# Patient Record
Sex: Male | Born: 1955
Health system: Southern US, Community
[De-identification: ages and names within clinical notes are randomized; demographics above are authoritative.]

## PROBLEM LIST (undated history)

## (undated) DIAGNOSIS — M1990 Unspecified osteoarthritis, unspecified site: Secondary | ICD-10-CM

## (undated) DIAGNOSIS — F329 Major depressive disorder, single episode, unspecified: Secondary | ICD-10-CM

## (undated) DIAGNOSIS — F32A Depression, unspecified: Secondary | ICD-10-CM

## (undated) DIAGNOSIS — G629 Polyneuropathy, unspecified: Secondary | ICD-10-CM

## (undated) HISTORY — PX: WISDOM TOOTH EXTRACTION: SHX21

## (undated) HISTORY — PX: OTHER SURGICAL HISTORY: SHX169

## (undated) HISTORY — DX: Polyneuropathy, unspecified: G62.9

## (undated) HISTORY — PX: TONSILLECTOMY AND ADENOIDECTOMY: SHX28

---
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
## 2003-01-03 ENCOUNTER — Ambulatory Visit (HOSPITAL_COMMUNITY): Admission: RE | Admit: 2003-01-03 | Discharge: 2003-01-03 | Payer: Self-pay | Admitting: Family Medicine

## 2003-01-03 ENCOUNTER — Encounter: Payer: Self-pay | Admitting: Family Medicine

## 2005-03-18 ENCOUNTER — Ambulatory Visit (HOSPITAL_COMMUNITY): Admission: RE | Admit: 2005-03-18 | Discharge: 2005-03-18 | Payer: Self-pay | Admitting: Family Medicine

## 2005-03-20 ENCOUNTER — Ambulatory Visit: Payer: Self-pay | Admitting: *Deleted

## 2005-03-21 ENCOUNTER — Ambulatory Visit: Payer: Self-pay

## 2005-03-21 ENCOUNTER — Encounter: Payer: Self-pay | Admitting: Cardiology

## 2005-03-27 ENCOUNTER — Ambulatory Visit: Payer: Self-pay | Admitting: Internal Medicine

## 2005-04-05 ENCOUNTER — Ambulatory Visit: Payer: Self-pay | Admitting: Internal Medicine

## 2005-04-09 ENCOUNTER — Ambulatory Visit: Payer: Self-pay | Admitting: *Deleted

## 2005-04-10 ENCOUNTER — Ambulatory Visit: Payer: Self-pay | Admitting: Cardiology

## 2005-04-10 ENCOUNTER — Inpatient Hospital Stay (HOSPITAL_BASED_OUTPATIENT_CLINIC_OR_DEPARTMENT_OTHER): Admission: RE | Admit: 2005-04-10 | Discharge: 2005-04-10 | Payer: Self-pay | Admitting: Cardiology

## 2005-04-17 ENCOUNTER — Ambulatory Visit (HOSPITAL_COMMUNITY): Admission: RE | Admit: 2005-04-17 | Discharge: 2005-04-17 | Payer: Self-pay | Admitting: Cardiovascular Disease

## 2005-04-17 ENCOUNTER — Ambulatory Visit: Payer: Self-pay | Admitting: Cardiovascular Disease

## 2005-05-02 ENCOUNTER — Ambulatory Visit: Payer: Self-pay | Admitting: *Deleted

## 2005-05-29 ENCOUNTER — Ambulatory Visit: Payer: Self-pay | Admitting: Internal Medicine

## 2007-09-02 ENCOUNTER — Ambulatory Visit (HOSPITAL_COMMUNITY): Admission: RE | Admit: 2007-09-02 | Discharge: 2007-09-02 | Payer: Self-pay | Admitting: Family Medicine

## 2009-03-27 ENCOUNTER — Ambulatory Visit: Payer: Self-pay | Admitting: Orthopedic Surgery

## 2009-03-27 DIAGNOSIS — M79609 Pain in unspecified limb: Secondary | ICD-10-CM

## 2009-03-27 DIAGNOSIS — G5790 Unspecified mononeuropathy of unspecified lower limb: Secondary | ICD-10-CM | POA: Insufficient documentation

## 2009-03-28 ENCOUNTER — Telehealth: Payer: Self-pay | Admitting: Orthopedic Surgery

## 2009-04-03 ENCOUNTER — Encounter (HOSPITAL_COMMUNITY): Admission: RE | Admit: 2009-04-03 | Discharge: 2009-05-03 | Payer: Self-pay | Admitting: Orthopedic Surgery

## 2009-04-11 ENCOUNTER — Encounter: Payer: Self-pay | Admitting: Orthopedic Surgery

## 2009-04-18 ENCOUNTER — Encounter: Payer: Self-pay | Admitting: Orthopedic Surgery

## 2009-04-19 ENCOUNTER — Encounter: Payer: Self-pay | Admitting: Orthopedic Surgery

## 2009-04-24 ENCOUNTER — Ambulatory Visit: Payer: Self-pay | Admitting: Orthopedic Surgery

## 2009-04-24 DIAGNOSIS — M84376A Stress fracture, unspecified foot, initial encounter for fracture: Secondary | ICD-10-CM | POA: Insufficient documentation

## 2009-05-03 ENCOUNTER — Ambulatory Visit (HOSPITAL_COMMUNITY): Admission: RE | Admit: 2009-05-03 | Discharge: 2009-05-03 | Payer: Self-pay | Admitting: Podiatry

## 2009-06-19 ENCOUNTER — Ambulatory Visit: Payer: Self-pay | Admitting: Orthopedic Surgery

## 2010-06-05 NOTE — Assessment & Plan Note (Signed)
Summary: 8 WK RE-CK LT FOOT/BCBS/CAF   Visit Type:  Follow-up  CC:  left foot pain.  History of Present Illness: I saw Clifford Harris in the office today for a followup visit.  He is a 55 years old man with the complaint of:  DX: stress fracture left foot.  Treatment: orthotics and rest.  MEDS:  Tylenol with Codeine.  PIROXICAM 20 Q DAY   Complaints: 100 percent better.  Today, scheduled for: 8 week recheck left foot.  Starts running tomorrow.  DR. Nolen Mu put him on Piroxicam.  Allergies: No Known Drug Allergies  Physical Exam  Additional Exam:  the foot looks good at this time. He does have a little contracture and the IP joint of the great toe. A little cyst over the talonavicular joint seen on MRI, but ankle motion is normal. His foot is nontender.     Impression & Recommendations:  Problem # 1:  STRESS FRACTURE, FOOT (ICD-733.94) Assessment Improved  Orders: Est. Patient Level II ([address]109)  Patient Instructions: 1)  CONTINUE THE PIROXICAM  2)  Please schedule a follow-up appointment as needed.

## 2010-09-21 NOTE — Cardiovascular Report (Signed)
Clifford Harris, Clifford Harris NO.:  [id number]   MEDICAL RECORD NO.:  [id number]          PATIENT TYPE:  OIB   LOCATION:  1961                         FACILITY:  [facility]   PHYSICIAN:  Rollene Rotunda, M.D.   DATE OF BIRTH:  05-04-1956   DATE OF PROCEDURE:  04/10/2005  DATE OF DISCHARGE:                              CARDIAC CATHETERIZATION   PROCEDURE:  Left heart catheterization/coronary angiography.   INDICATIONS FOR PROCEDURE:  Evaluate patient with syncope and an abnormal  Cardiolite suggesting apical ischemia.  The patient also has an abnormal  EKG.   PROCEDURE NOTE:  Left heart catheterization was attempted via the right  femoral artery.  I was able to cannulate the artery easily, but the guide-  wire would not pass as it kept deviating into a small branch vessel.  I then  switched to the left side.  Both vessels were cannulated using anterior wall  puncture.  #4 French arterial sheaths were inserted.  I was easily able to  perform the procedure from the left side.  Preformed Judkins pigtail  catheters were utilized.   RESULTS:  Hemodynamics:  LV 123/15, AO 123/98.   Coronaries:  The left main was normal.  The LAD was large wrapping the apex  and normal.  There was a large first diagonal which was normal.  The  circumflex and the AV groove was normal.  Essentially, the circumflex  consisted of a large mid obtuse marginal which was normal.  The right  coronary artery was dominant and normal throughout its course.  The PDA was  large and normal.   Left ventriculogram:  The left ventriculogram was obtained in the RAO  projection.  The EF was approximately 60%.  There was a spade shaped apex  suggestive of possible non-compaction.   CONCLUSION:  Normal coronaries.  Questionable non-compaction of the left  ventricular  apex.   PLAN:  This was discussed with Dr. Eden Emms who will schedule an MRI for this  patient.           ______________________________  Rollene Rotunda, M.D.     JH/MEDQ  D:  04/10/2005  T:  04/10/2005  Job:  [id number]   cc:   Kirk Ruths, M.D.  Fax: [phone]   Doylene Canning. Ladona Ridgel, M.D.  [address]  [address]  [place]  [place] [address]

## 2010-09-26 ENCOUNTER — Encounter: Payer: Self-pay | Admitting: Family Medicine

## 2010-09-26 ENCOUNTER — Ambulatory Visit (INDEPENDENT_AMBULATORY_CARE_PROVIDER_SITE_OTHER): Payer: BC Managed Care – PPO | Admitting: Family Medicine

## 2010-09-26 DIAGNOSIS — M19072 Primary osteoarthritis, left ankle and foot: Secondary | ICD-10-CM

## 2010-09-26 DIAGNOSIS — R269 Unspecified abnormalities of gait and mobility: Secondary | ICD-10-CM

## 2010-09-26 DIAGNOSIS — M19079 Primary osteoarthritis, unspecified ankle and foot: Secondary | ICD-10-CM

## 2010-09-26 NOTE — Progress Notes (Signed)
Subjective:    Patient ID: Clifford Harris, male    DOB: March 31, 1956, 54 y.o.   MRN: [id number]  HPI 55 year old male avid runner here for evaluation and treatment of left foot pain. He has had left midfoot pain for least 3 years. He has also run a marathon in [place] 2 years ago. He essentially is constant aching pain in the left medial midfoot region. He has seen several doctors in [place] and [place] for this. These include Dr. Lajoyce Corners at [facility], Dr. Prescilla Sours, Dr. Pricilla Holm, and Dr. Romeo Apple. He's had a bone scan in the past that shows possible stress fracture. He's also had an MRI. He was told he may have a Charcot type joint.  He was initially sent to by a tech for some orthotics recently, but found out there made of soft work and instead was referred here by a friend for custom orthotic evaluation.  His pain is mostly in the left midfoot and sometimes shoots down his toes. It is an achy pain, sometimes has a burning quality as well as occasional numbness and tingling. It is constant, but worse with running. He is currently only doing 20 miles per week. Occasionally he'll have pain at bedtime if the sheets rubbed his foot wrong. He has tried piroxicam and states that it does help, but does not want to take an anti-inflammatory long-term. He has also tried several different inserts and pads. He has no pain at rest.  PMH: Denies chronic medical problems.  Exertional syncope within last couple of years with normal coronary cath. PSH: Denies Meds: Pyroxicam prn Allergies: denies Family Hx: HTN Social Hx: Denies smoking or illicits, Occasional EtOH use.  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  Review of Systems Denies headaches, vision changes, swallowing problems, chest pain, shortness of breath, abdominal pain, bowel changes, bladder problems, fever, sweats, chills, weight loss.    Objective:   Physical Exam Gen. appearance: Well-appearing male in no distress Head: Normocephalic  atraumatic Psych: Normal affect Neuro: Alert and oriented x3, cranial nerves II through XII intact ENT moist mucous membranes Eyes: Extraocular muscles intact Neck some: Supple Chest: no labored breathing Abdomen: soft Hips: Full range of motion without pain. Bilaterally he has weak hip abduction, left greater than right. Knees: Full range of motion, no effusion. Fairly good VMO tone. Feet: Right foot shows moderate pedis cavus foot. Still is fairly good longitudinal and transverse arch. He does have some hammering of his toes. Left foot shows mild bunion of the first MTP with tenderness to palpation of the first MTP joint with some mild spurring. Medial midfoot shows some obvious hypertrophic bony changes with spurring. He also has some chronic mild erythema and little brawny appearance of the skin here. He has tenderness in this region over the navicular and medial cuneiform bones. Ankles: He has extremely tight Achilles tendon bilaterally, left greater than right. He has normal posterior tibialis tendon function. Gait: Walking shows mild out toeing bilaterally. On the right he has significant collapse of his midfoot. Running form shows him to be a midfoot Striker with again obvious collapse of the midfoot. Leg lengths: Equal  X-rays: Outside 2 views of the feet show midfoot arthritis on the left with some spurring in the region of the navicular bone and medial cuneiform. He also some arthritic changes about the first MTP joint on the left side. MRI: Images of the disc were reviewed. These show some mild arthritis in the midfoot region. He also has some edema  present in the navicular.        Assessment & Plan:  #1 left midfoot pain and arthritis #2 Gait abnormality with out toeing gait and midfoot collapse #3 possible nerve entrapment versus Morton's neuroma  Discussed options with the patient, at this point we'll proceed with custom orthotics. See procedure below. He tried these  afterward they felt very good and helped correct his midfoot collapse. He will followup with me in one month, and if he would like different ones for his dress shoes and we can make them at that time. Also gave him hip strengthening exercises to work on his hip weakness. He declined any new medicines today, and just wants to try Tylenol Extra Strength. Followup in one month.  Patient was fitted for a : standard, cushioned, semi-rigid orthotic. The orthotic was heated and afterward the patient stood on the orthotic blank positioned on the orthotic stand. The patient was positioned in subtalar neutral position and 10 degrees of ankle dorsiflexion in a weight bearing stance. After completion of molding, a stable base was applied to the orthotic blank. The blank was ground to a stable position for weight bearing. Size: 11 blue swirl Base: blue EVA Posting: small blue foam to medial arch on Lt foot (I accidentally tapered a little too much on the medial aspect of the orthotic so I added back the blue foam which was very comfortable) Additional orthotic padding: none  At followup visit, can consider adding neuroma pad to see if this helps out with the numbness and tingling in his toes. At this point I don't think that is his major concern  I spent more than 45 minutes with this patient, greater than 50% spent on making his orthotics as well as counseling in face-to-face time regarding diagnosis, prognosis, and management. In addition we also reviewed his x-rays and MRI in detail with him.

## 2010-10-31 ENCOUNTER — Ambulatory Visit (INDEPENDENT_AMBULATORY_CARE_PROVIDER_SITE_OTHER): Payer: BC Managed Care – PPO | Admitting: Family Medicine

## 2010-10-31 ENCOUNTER — Encounter: Payer: Self-pay | Admitting: Family Medicine

## 2010-10-31 DIAGNOSIS — R269 Unspecified abnormalities of gait and mobility: Secondary | ICD-10-CM

## 2010-10-31 DIAGNOSIS — M19072 Primary osteoarthritis, left ankle and foot: Secondary | ICD-10-CM

## 2010-10-31 DIAGNOSIS — M19079 Primary osteoarthritis, unspecified ankle and foot: Secondary | ICD-10-CM

## 2010-10-31 NOTE — Progress Notes (Signed)
  Subjective:    Patient ID: Clifford Harris, male    DOB: 11-Jan-1956, 55 y.o.   MRN: [id number]  HPI F/u Lt foot pain. 20-30% better.  He has h/o of midfoot arthritis at navicular and medial cuneiform bones as well as 1st MTP.  MRI previously showed stress injury to navicular.  We made orthotics 1 month ago, he does like these, however on Lt orthotic where I put blue posting along medial edge, this often does not stay in place. He would like new pair of orthotics today. Running 12 MPW, riding bike 2 days per week.  Can run 1/2 of his run then starts getting pain, usually better after run.  He is training for 5 mile [place] at [facility] next month.  Review of Systems Denies F,C,S    Objective:   Physical Exam Gen: NAD Rt foot: moderate pes cavus Lt foot: midfoot collapse with hypertrophy at midfoot joint and some arthritic changes. Gait: still shows some pronation and collapse of the Lt midfoot       Assessment & Plan:  Lt foot pain with midfoot arthritis and mild navicular stress injury - made new orthotics today, see note.  I did not charge him for these since I accidentally shaved off too much of the EVA base on his Lt side on the first pair - in addition, for his original Lt orthotic, I glued down a brick tapered posting on medial arch region which he liked a lot, so I actually did the same thing for his new orthotic as well - f/u 2-3 months or prn  Patient was fitted for a : standard, cushioned, semi-rigid orthotic. The orthotic was heated and afterward the patient stood on the orthotic blank positioned on the orthotic stand. The patient was positioned in subtalar neutral position and 10 degrees of ankle dorsiflexion in a weight bearing stance. After completion of molding, a stable base was applied to the orthotic blank. The blank was ground to a stable position for weight bearing. Size: 11 blue swirl Base: EVA Posting: brick taper on Lt arch Additional orthotic padding:  none  Tried these prior to leaving and they significantly improved his gait and pain.

## 2011-03-15 ENCOUNTER — Ambulatory Visit (INDEPENDENT_AMBULATORY_CARE_PROVIDER_SITE_OTHER): Payer: BC Managed Care – PPO | Admitting: Sports Medicine

## 2011-03-15 VITALS — BP 138/80 | Ht 70.0 in | Wt 155.0 lb

## 2011-03-15 DIAGNOSIS — R269 Unspecified abnormalities of gait and mobility: Secondary | ICD-10-CM

## 2011-03-15 DIAGNOSIS — M19072 Primary osteoarthritis, left ankle and foot: Secondary | ICD-10-CM

## 2011-03-15 DIAGNOSIS — M19079 Primary osteoarthritis, unspecified ankle and foot: Secondary | ICD-10-CM

## 2011-03-15 MED ORDER — GABAPENTIN 300 MG PO CAPS: 300.0000 mg | ORAL_CAPSULE | Freq: Every day | ORAL | Status: DC

## 2011-03-15 MED ORDER — KETOPROFEN POWD: Status: DC

## 2011-03-15 NOTE — Assessment & Plan Note (Signed)
This patient has fairly advanced midfoot arthritis and loss of the arch on that side. He is also getting some first MTP arthritis of that foot  I think this may have been triggered by bilateral tarsal tunnel syndrome with a history of a very high arch  This probably accounts for the significant hammertoes  We will give him a trial of topical ketoprofen and when necessary Aleve  Also try gabapentin 300 at night for one month then recheck

## 2011-03-15 NOTE — Patient Instructions (Signed)
Use Aleve 1-2 tabs for ankle pain  Use orthotics for any running  Rub ketoprofen gel into ankle 3-4 times per day  Please follow up in 4-6 weeks

## 2011-03-15 NOTE — Assessment & Plan Note (Signed)
For his gait he is unable to push off the left foot We put a first ray post of his orthotic and this did improve his push off He has external rotation of both feet but more so on the left with running

## 2011-03-15 NOTE — Progress Notes (Signed)
  Subjective:    Patient ID: Clifford Harris, male    DOB: [birth year], 55 y.o.   MRN: [id number]  HPI  Pt presents to clinic for evaluation of lt foot pain that he has had for several years.  Long hx of running- has done 34 marathons.  Now running 2-3 miles per day. Saw Dr. Romeo Apple orthopedist in [place], who sent him to podiatrist who was concerned about Charcot foot in 2010.  Was put in hard 3/4 length orthotic which was painful.  Saw Dr. Lajoyce Corners who sent him for cork orthotics- he did not have these made.  Takes tylenol extra strength or tylenol arthritis for pain.  Pt has numbness in the ball of lt foot that started even before the break down of lt midfoot.   He has seen Dr. [name] on 2 occasions and was making him custom orthotics with an extra midfoot post over the arch of the left foot These are helpful and give him more support than the other orthotics   Review of Systems     Objective:   Physical Exam  Normal motion L ankle, limited dorsiflexion 30 deg extension great toe, 20 deg flexion - lt  30 deg flexion great toe, 20 deg extension - rt Lt hip abduction and flexion strength excellent Hammer toes 2-4 on bilat, subluxation of 5th toe and bunionette on LT Lt midfoot collapse with navicular drop   X-rays reveal some midfoot arthritis on the left primarily involving the navicular MRI reveals that there is degeneration of the talonavicular joint and also some partial collapse of the navicular There is abnormal signal in the bone is well     Assessment & Plan:

## 2011-04-11 ENCOUNTER — Ambulatory Visit (INDEPENDENT_AMBULATORY_CARE_PROVIDER_SITE_OTHER): Payer: BC Managed Care – PPO | Admitting: Sports Medicine

## 2011-04-11 ENCOUNTER — Encounter: Payer: Self-pay | Admitting: Sports Medicine

## 2011-04-11 VITALS — BP 130/82 | HR 65

## 2011-04-11 DIAGNOSIS — R269 Unspecified abnormalities of gait and mobility: Secondary | ICD-10-CM

## 2011-04-11 DIAGNOSIS — M19079 Primary osteoarthritis, unspecified ankle and foot: Secondary | ICD-10-CM

## 2011-04-11 DIAGNOSIS — G5700 Lesion of sciatic nerve, unspecified lower limb: Secondary | ICD-10-CM

## 2011-04-11 DIAGNOSIS — M79609 Pain in unspecified limb: Secondary | ICD-10-CM

## 2011-04-11 DIAGNOSIS — M5430 Sciatica, unspecified side: Secondary | ICD-10-CM

## 2011-04-11 DIAGNOSIS — M19072 Primary osteoarthritis, left ankle and foot: Secondary | ICD-10-CM

## 2011-04-11 MED ORDER — GABAPENTIN 300 MG PO CAPS: 300.0000 mg | ORAL_CAPSULE | Freq: Every day | ORAL | Status: DC

## 2011-04-11 MED ORDER — KETOPROFEN POWD: Status: DC

## 2011-04-11 NOTE — Patient Instructions (Addendum)
Continue to take Gabapentin 300 mg at night.  Continue to take ketoprofen cream as needed on left foot.  C/w orthotics when walking and running.   F/U in 3 months.

## 2011-04-11 NOTE — Assessment & Plan Note (Signed)
Exam for back localization negative.  Pain likely referred from foot, tight gluteus/hamstring/piriformis.  Gabapentin working well at low dose.

## 2011-04-11 NOTE — Assessment & Plan Note (Signed)
Much improved and I will have him continue the ketoprofen gel  Recheck with me in 3 months

## 2011-04-11 NOTE — Progress Notes (Signed)
  Subjective:    Patient ID: Clifford Harris, male    DOB: Nov 03, 1955, 55 y.o.   MRN: [id number]  HPI  F/u visit for left foot pain and left neuropathic type pain. Long hx of running- has done 34 marathons.  Now running 2-3 miles per day. Charcot foot in 2010.   Pt has numbness in the ball of lt foot that started even before the break down of lt midfoot.  His foot pain and nerve pain has improved 80% subjectively. He has great relief from gabapentin 300 mg QHS and ketoprofen ointment to left foot. His orthotics with an extra midfoot post over the arch of the left foot are giving him relief when walking and running. He is planning on running a 5 mile race.  Review of Systems No fever, new arthralgias, weight loss. Drinks 4 or more beers on weekends.    Objective:   Physical Exam  Lt hip straight leg normal without back or sciatic radiation. Brazinski's normal left side. Palpation of hamstring on left side elicited mild nerve sensation. Hammer toes 2-4 on bilat, subluxation of 5th toe and bunionette on LT Lt midfoot collapse with navicular drop   X-rays reveal some midfoot arthritis on the left primarily involving the navicular MRI reveals that there is degeneration of the talonavicular joint and also some partial collapse of the navicular There is abnormal signal in the bone is well     Assessment & Plan:

## 2011-04-11 NOTE — Assessment & Plan Note (Signed)
Continue orthotics as this feels improved

## 2011-04-11 NOTE — Assessment & Plan Note (Signed)
80% improvement.  C/w gabapentin and ketoprofen topically.  Orthotics working well.   Able to run 3 miles daily. Planning on running 5 mile run.

## 2011-07-08 ENCOUNTER — Ambulatory Visit (INDEPENDENT_AMBULATORY_CARE_PROVIDER_SITE_OTHER): Payer: BC Managed Care – PPO | Admitting: Sports Medicine

## 2011-07-08 ENCOUNTER — Encounter: Payer: Self-pay | Admitting: Sports Medicine

## 2011-07-08 VITALS — BP 137/88 | HR 61

## 2011-07-08 DIAGNOSIS — M19072 Primary osteoarthritis, left ankle and foot: Secondary | ICD-10-CM

## 2011-07-08 DIAGNOSIS — M5430 Sciatica, unspecified side: Secondary | ICD-10-CM

## 2011-07-08 DIAGNOSIS — M19079 Primary osteoarthritis, unspecified ankle and foot: Secondary | ICD-10-CM

## 2011-07-08 MED ORDER — KETOPROFEN POWD: Status: DC

## 2011-07-08 NOTE — Assessment & Plan Note (Signed)
Since the ketoprofen gel is working for his pain we will continue with this.  He will increase the frequency to QID so that he can have a longer period of pain relief.  He will continue his activity as tolerated, including running.  We gave him extra forefoot padding in his orthotics at a previous visit to accommodate for his lack of midfoot mobility.

## 2011-07-08 NOTE — Progress Notes (Signed)
  Subjective:    Patient ID: JAVARRI SEGAL, male    DOB: [birth year], 56 y.o.   MRN: [id number]  HPI 56 y/o male is here to follow up for left foot pain and sciatica.  The sciatica is improved with gabapentin qhs.  He has no sciatic pain.  The foot pain is unchanged since last visit.  The ketoprofen gel helps his pain but he feels it wears off too soon.  He is taking it twice daily.  He has been able to continue running.  He has been wearing his orthotics.  He has new shoes which are more cushioned than his old shoes.  He is planning on running 7 mile uphill race.   Review of Systems     Objective:   Physical Exam  Left foot has some bony deformity of the midfoot In the vicinity of the navicular. There is tenderness to palpation over these bony prominences The swelling that was noted on his previous exam is resolved There is now no swelling of the foot, specifically over the dorsal or the lateral foot  Gait: When running he everts the left foot. He doesn't get plantar flexion motion with this foot so it is flopping down between strides with fairly high impact and not allowing for a normal push off.      Assessment & Plan:

## 2011-07-08 NOTE — Assessment & Plan Note (Signed)
This is well controlled on low dose of gabapentin  Continue this indefinitely  Can try weaning if this is resolved at his next visit in 4 months or so

## 2011-08-08 ENCOUNTER — Other Ambulatory Visit: Payer: Self-pay | Admitting: *Deleted

## 2011-08-08 MED ORDER — KETOPROFEN POWD: Status: DC

## 2011-10-09 ENCOUNTER — Ambulatory Visit (INDEPENDENT_AMBULATORY_CARE_PROVIDER_SITE_OTHER): Payer: BC Managed Care – PPO | Admitting: Sports Medicine

## 2011-10-09 VITALS — BP 124/70

## 2011-10-09 DIAGNOSIS — M19079 Primary osteoarthritis, unspecified ankle and foot: Secondary | ICD-10-CM

## 2011-10-09 DIAGNOSIS — M19072 Primary osteoarthritis, left ankle and foot: Secondary | ICD-10-CM

## 2011-10-09 MED ORDER — TRAMADOL HCL 50 MG PO TABS: 50.0000 mg | ORAL_TABLET | Freq: Four times a day (QID) | ORAL | Status: DC | PRN

## 2011-10-09 NOTE — Assessment & Plan Note (Addendum)
Unfortunately we need to hold therapy briefly while we treat his extensive fungal infection. He uses Aleve twice a day which is not successful. I would certainly recommend increasing to tramadol briefly until his fungus history, then he can return to the ketoprofen gel. Certainly at some point, we can offer an injection into the navicular/medial cuneiform joint, and his talonavicular ganglion cyst. We can see him back in one month.

## 2011-10-09 NOTE — Progress Notes (Signed)
Patient ID: Clifford Harris, male   DOB: 29-Oct-1955, 56 y.o.   MRN: [id number]  Subjective:   WU:[id number] foot, new rash.  WGN:FAOZH is a very pleasant 56 year old male who we've been treating for foot pain. He subsequently had an MRI, that confirmed a talonavicular ganglion cyst, as well as mid foot degenerative changes, most pronounced at the naviculo-medial cuneiform joint.  He is been managed successfully with topical ketoprofen gel. Unfortunately he recently developed fungus that was fairly widespread over his left foot, left hand, and face. He went to the dermatologist, who prescribed clobetasol, terbinafine oral, topical hydrocortisone, an oral prednisone. He has since stopped the ketoprofen. He is wondering if there another option to control his pain until the fungus is treated.   Past medical history, surgical history, family history, social history, allergies, and medications reviewed from the medical record and no changes needed.  Review of Systems: No fevers, chills, night sweats, weight loss, chest pain, or shortness of breath.    Objective:  General:  Well Developed, well nourished, and in no acute distress. Neuro:  Alert and oriented x3, extra-ocular muscles intact. Skin: Warm and dry. Respiratory:  Not using accessory muscles, speaking in full sentences. Musculoskeletal: still tender to palpation along the medial mid foot. He does have a significant scaly, cracked rash over his dorsum of his left foot, his entire left hand, and his face at the nasolabial folds.   Assessment & Plan:

## 2011-10-17 ENCOUNTER — Other Ambulatory Visit: Payer: Self-pay | Admitting: *Deleted

## 2011-10-17 MED ORDER — GABAPENTIN 300 MG PO CAPS: 300.0000 mg | ORAL_CAPSULE | Freq: Every day | ORAL | Status: DC

## 2012-03-03 ENCOUNTER — Other Ambulatory Visit: Payer: Self-pay | Admitting: *Deleted

## 2012-03-03 DIAGNOSIS — M19072 Primary osteoarthritis, left ankle and foot: Secondary | ICD-10-CM

## 2012-03-03 MED ORDER — TRAMADOL HCL 50 MG PO TABS: 50.0000 mg | ORAL_TABLET | Freq: Four times a day (QID) | ORAL | Status: DC | PRN

## 2012-04-16 ENCOUNTER — Other Ambulatory Visit: Payer: Self-pay | Admitting: *Deleted

## 2012-04-16 MED ORDER — GABAPENTIN 300 MG PO CAPS: 300.0000 mg | ORAL_CAPSULE | Freq: Every day | ORAL | Status: DC

## 2012-07-24 ENCOUNTER — Other Ambulatory Visit: Payer: Self-pay | Admitting: *Deleted

## 2012-07-24 DIAGNOSIS — M19072 Primary osteoarthritis, left ankle and foot: Secondary | ICD-10-CM

## 2012-07-24 MED ORDER — TRAMADOL HCL 50 MG PO TABS: 50.0000 mg | ORAL_TABLET | Freq: Four times a day (QID) | ORAL | Status: DC | PRN

## 2012-09-16 ENCOUNTER — Other Ambulatory Visit: Payer: Self-pay | Admitting: Sports Medicine

## 2012-09-22 ENCOUNTER — Other Ambulatory Visit: Payer: Self-pay | Admitting: *Deleted

## 2012-09-22 DIAGNOSIS — M19072 Primary osteoarthritis, left ankle and foot: Secondary | ICD-10-CM

## 2012-09-22 MED ORDER — TRAMADOL HCL 50 MG PO TABS: 50.0000 mg | ORAL_TABLET | Freq: Four times a day (QID) | ORAL | Status: AC | PRN

## 2012-09-22 NOTE — Progress Notes (Signed)
Scheduled pt for a f/u appt 10/21/12 with Dr. Darrick Penna.

## 2012-10-12 ENCOUNTER — Other Ambulatory Visit: Payer: Self-pay | Admitting: *Deleted

## 2012-10-12 MED ORDER — GABAPENTIN 300 MG PO CAPS: 300.0000 mg | ORAL_CAPSULE | Freq: Every day | ORAL | Status: DC

## 2012-10-21 ENCOUNTER — Ambulatory Visit (INDEPENDENT_AMBULATORY_CARE_PROVIDER_SITE_OTHER): Payer: BC Managed Care – PPO | Admitting: Sports Medicine

## 2012-10-21 ENCOUNTER — Encounter: Payer: Self-pay | Admitting: Sports Medicine

## 2012-10-21 VITALS — BP 134/84 | HR 51 | Ht 70.0 in | Wt 150.0 lb

## 2012-10-21 DIAGNOSIS — M19072 Primary osteoarthritis, left ankle and foot: Secondary | ICD-10-CM

## 2012-10-21 DIAGNOSIS — M19079 Primary osteoarthritis, unspecified ankle and foot: Secondary | ICD-10-CM

## 2012-10-21 MED ORDER — GABAPENTIN 300 MG PO CAPS: 300.0000 mg | ORAL_CAPSULE | Freq: Every day | ORAL | Status: DC

## 2012-10-21 MED ORDER — TRAMADOL HCL 50 MG PO TABS: 50.0000 mg | ORAL_TABLET | Freq: Three times a day (TID) | ORAL | Status: DC | PRN

## 2012-10-21 MED ORDER — DICLOFENAC SODIUM 1 % TD GEL: 2.0000 g | Freq: Four times a day (QID) | TRANSDERMAL | Status: DC

## 2012-10-21 NOTE — Assessment & Plan Note (Addendum)
Likely having some progression from last visit but still is allowing patient to run regularly.  Patient did have refill of nuerontin, increased tramadol for more pain control during the day.  Discussed proper shoe wear and make sure plenty of padding.  Change to voltaren gel from ketoprofen to try.  Come back again if worsening pain or annually at least

## 2012-10-21 NOTE — Progress Notes (Signed)
CC: F/u foot pain  HPI:  Patient is here for follow up of foot pain.  Patient did have a ganglion cyst of the talonavicular joint over 2 years ago, but it is really more the degenerative changes of the talonavicular joint that is causing the discomfort. Also severe naviculo-medial cuneiform joint.  He continues the neurontin at night, tramadol 1 time during the day and ketoprofen powder which is very expensive.  States it takes the edges off. No new symptoms.  Still running up to 5-6 miles a day. Patient is contemplating doing one more marathon. But is doing a 10K here in the next couple weeks.   Past medical history, social, surgical and family history all reviewed.   Physical exam Blood pressure 134/84, pulse 51, height 5\' 10"  (1.778 m), weight 150 lb (68.04 kg). General: No apparent distress alert and oriented x3 mood and affect normal Respiratory: Patient's speak in full sentences and does not appear short of breath Skin: Warm dry intact with no signs of infection or rash Neuro: Cranial nerves II through XII are intact, neurovascularly intact in all extremities with 2+ DTRs and 2+ pulses. Left foot exam:  Still has mild discomfort over talo-navicular joint but less effusion from last one. Decrease ROM of the joint as well. NVI distally.

## 2012-10-21 NOTE — Patient Instructions (Addendum)
Good to see you.  We have refilled your neurontin and your increase your tramadol to 100mg  daily.  We have printed you a prescription of Voltaren gel to try instead of the ketoprofen.  Continue to adjust your hobbies to allow you to run.  If you ever have worsening pain or new pain please come back and we will adjust accordingly.

## 2012-10-29 ENCOUNTER — Other Ambulatory Visit: Payer: Self-pay | Admitting: *Deleted

## 2012-10-29 ENCOUNTER — Other Ambulatory Visit: Payer: Self-pay | Admitting: Sports Medicine

## 2012-10-29 MED ORDER — TRAMADOL HCL 50 MG PO TABS: 50.0000 mg | ORAL_TABLET | Freq: Three times a day (TID) | ORAL | Status: DC | PRN

## 2013-01-12 ENCOUNTER — Other Ambulatory Visit: Payer: Self-pay | Admitting: *Deleted

## 2013-01-12 ENCOUNTER — Other Ambulatory Visit: Payer: Self-pay | Admitting: Sports Medicine

## 2013-01-12 MED ORDER — TRAMADOL HCL 50 MG PO TABS: 100.0000 mg | ORAL_TABLET | Freq: Three times a day (TID) | ORAL | Status: DC | PRN

## 2013-01-12 NOTE — Progress Notes (Signed)
Refilled with directions to take 100 mg tid prn per Dr. Darrick Penna.  Pt notified. States he usually takes a total of 4 tabs per day, but sometimes takes 6 tabs.

## 2013-06-05 ENCOUNTER — Other Ambulatory Visit: Payer: Self-pay | Admitting: Sports Medicine

## 2013-06-16 ENCOUNTER — Encounter: Payer: Self-pay | Admitting: Family Medicine

## 2013-06-16 ENCOUNTER — Ambulatory Visit (INDEPENDENT_AMBULATORY_CARE_PROVIDER_SITE_OTHER): Payer: BC Managed Care – PPO | Admitting: Family Medicine

## 2013-06-16 VITALS — BP 161/88 | HR 58 | Ht 71.0 in | Wt 143.0 lb

## 2013-06-16 DIAGNOSIS — M25551 Pain in right hip: Secondary | ICD-10-CM

## 2013-06-16 DIAGNOSIS — M25559 Pain in unspecified hip: Secondary | ICD-10-CM

## 2013-06-16 MED ORDER — IBUPROFEN 800 MG PO TABS: 800.0000 mg | ORAL_TABLET | Freq: Three times a day (TID) | ORAL | Status: DC | PRN

## 2013-06-16 NOTE — Patient Instructions (Addendum)
You have strained/partially tore one of your hip external rotators. These are treated conservatively. Take ibuprofen 800mg  three times a day with food for pain and inflammation  Try to avoid painful activities (typically squats, lunges, hill running) otherwise it's activities as tolerated. Tennis ball to massage area when sitting if you can tolerate this and feels better with this. Start physical therapy to learn an extensive home exercise program (hip abduction, external rotation strengthening and stretching). Follow up in 5-6 weeks for reevaluation.

## 2013-06-22 ENCOUNTER — Encounter: Payer: Self-pay | Admitting: Family Medicine

## 2013-06-22 DIAGNOSIS — M25551 Pain in right hip: Secondary | ICD-10-CM | POA: Insufficient documentation

## 2013-06-22 NOTE — Progress Notes (Signed)
Patient ID: Clifford Harris, male   DOB: 08/08/1955, 58 y.o.   MRN: [id number]  PCP: Kirk RuthsMCGOUGH,WILLIAM M, MD  Subjective:   HPI: Patient is a 58 y.o. male here for right hip pain.  Patient reports pain started in December when he was hanging christmas orb lights. He was twisting when he felt a rip or tear sensation in right buttock. Pain comes on with hip extended. Radiates down to knee. No numbness/tingling. Takes neurontin, tramadol, voltaren gel with some benefit. Has continued to run through these injuries - pain worse with running. Using icy hot also. No back pain. No bowel/bladder dysfunction.  Past Medical History  Diagnosis Date  . Neuropathy     Current Outpatient Prescriptions on File Prior to Visit  Medication Sig Dispense Refill  . diclofenac sodium (VOLTAREN) 1 % GEL Apply 2 g topically 4 (four) times daily. To affected joint.  100 g  11  . gabapentin (NEURONTIN) 300 MG capsule Take 1 capsule (300 mg total) by mouth at bedtime.  90 capsule  3  . traMADol (ULTRAM) 50 MG tablet TAKE TWO TABLETS THREE TIMES DAILY AS NEEDED FOR PAIN.  180 tablet  1   No current facility-administered medications on file prior to visit.    History reviewed. No pertinent past surgical history.  No Known Allergies  History   Social History  . Marital Status: Married    Spouse Name: N/A    Number of Children: N/A  . Years of Education: N/A   Occupational History  . Not on file.   Social History Main Topics  . Smoking status: Never Smoker   . Smokeless tobacco: Never Used  . Alcohol Use: Not on file  . Drug Use: Not on file  . Sexual Activity: Not on file   Other Topics Concern  . Not on file   Social History Narrative  . No narrative on file    Family History  Problem Relation Age of Onset  . Sudden death Neg Hx   . Hypertension Neg Hx   . Hyperlipidemia Neg Hx   . Diabetes Neg Hx   . Heart attack Neg Hx     BP 161/88  Pulse 58  Ht 5\' 11"  (1.803 m)  Wt 143 lb  (64.864 kg)  BMI 19.95 kg/m2  Review of Systems: See HPI above.    Objective:  Physical Exam:  Gen: NAD  Back/R hip: No gross deformity, scoliosis. TTP Right buttock over piriformis, hip rotators.  No midline or bony TTP. FROM without pain. Strength LEs 5/5 all muscle groups with pain on abduction.   2+ MSRs in patellar and achilles tendons, equal bilaterally. Negative SLRs. Sensation intact to light touch bilaterally. Negative logroll bilateral hips Negative fabers. Pain with right piriformis.    Assessment & Plan:  1. Right hip pain - consistent with strain/partial tear of external rotator of hip - possibly piriformis, gemelli.  Start with physical therapy, home exercise program.  Nsaids, tennis ball massage, avoid painful activities.  F/u in 5-6 weeks.

## 2013-06-22 NOTE — Assessment & Plan Note (Signed)
consistent with strain/partial tear of external rotator of hip - possibly piriformis, gemelli.  Start with physical therapy, home exercise program.  Nsaids, tennis ball massage, avoid painful activities.  F/u in 5-6 weeks.

## 2013-06-29 ENCOUNTER — Ambulatory Visit (HOSPITAL_COMMUNITY)
Admission: RE | Admit: 2013-06-29 | Discharge: 2013-06-29 | Disposition: A | Discharge status: Home / Self Care | Payer: BC Managed Care – PPO | Source: Ambulatory Visit | Attending: Family Medicine | Admitting: Family Medicine

## 2013-06-29 DIAGNOSIS — G589 Mononeuropathy, unspecified: Secondary | ICD-10-CM | POA: Insufficient documentation

## 2013-06-29 DIAGNOSIS — IMO0001 Reserved for inherently not codable concepts without codable children: Secondary | ICD-10-CM | POA: Insufficient documentation

## 2013-06-29 DIAGNOSIS — M25669 Stiffness of unspecified knee, not elsewhere classified: Secondary | ICD-10-CM | POA: Insufficient documentation

## 2013-06-29 DIAGNOSIS — S76319A Strain of muscle, fascia and tendon of the posterior muscle group at thigh level, unspecified thigh, initial encounter: Secondary | ICD-10-CM

## 2013-06-29 DIAGNOSIS — M5430 Sciatica, unspecified side: Secondary | ICD-10-CM | POA: Insufficient documentation

## 2013-06-29 DIAGNOSIS — R269 Unspecified abnormalities of gait and mobility: Secondary | ICD-10-CM | POA: Insufficient documentation

## 2013-06-29 NOTE — Evaluation (Signed)
Physical Therapy Evaluation  Patient Details  Name: Clifford Harris MRN: [id number] Date of Birth: Oct 31, 1955  Today's Date: 06/29/2013 Time: 1100-1155 PT Time Calculation (min): 55 min Charge:  Evaluation 1100-1145; [place] [id number]             Visit#: 1 of 4  Re-eval: 07/29/13 Assessment Diagnosis: Hamstring pull Next MD Visit: 07/13/2013 Prior Therapy: none  Authorization: BCBS    Past Medical History:  Past Medical History  Diagnosis Date  . Neuropathy    Past Surgical History: No past surgical history on file.  Subjective Symptoms/Limitations Symptoms: Patient reports that his pain started in December when he was hanging christmas orb lights.  He states that he was twisting when he felt a rip or tear sensation in right buttock.  He now has increased pain with offloading during jogging.      He is taking medication at night to help him sleep for some foot arthritis  which successful so he is not sure if the buttock pain bothers him at night.  .  The pt states that he sits on a three inch gel cushion or he has incresaed pain.  He is continuing to run 1/2 marathons and 5 K's  How long can you sit comfortably?: Sitting without gel pad he will have pain within five minutes. How long can you stand comfortably?: no difference. How long can you walk comfortably?: Unable to walk two miles  Repetition: Increases Symptoms Pain Assessment Currently in Pain?: No/denies Pain Score: 10-Worst pain ever Pain Location: Buttocks Pain Onset: More than a month ago Pain Frequency: Intermittent Pain Relieving Factors: changing position   Sensation/Coordination/Flexibility/Functional Tests Flexibility 90/90: Positive Functional Tests Functional Tests: foto 51  Assessment RLE Strength Right Hip Flexion: 5/5 Right Hip Extension: 3-/5 Right Hip ABduction: 5/5 Right Knee Flexion: 3+/5 Right Knee Extension: 5/5 Right Ankle Dorsiflexion: 4/5  Exercise/Treatments  Stretches Active Hamstring  Stretch: 3 reps;30 seconds Passive Hamstring Stretch: 1 rep;60 seconds   Prone  Hamstring Curl: 10 reps Hip Extension: 10 reps;Limitations Hip Extension Limitations: with knee bent to 90 degrees due to SLR causing pain.   Modalities Modalities: Ultrasound Ultrasound Ultrasound Location: Rt buttock Ultrasound Parameters: 1.5 w/cm2 x 8:00 Ultrasound Goals: Pain  Physical Therapy Assessment and Plan PT Assessment and Plan Clinical Impression Statement: Pt is a 58 yo male who strained his Rt gluteus maximus as well as his hamstring in December of 2014.  Mr. Gear has been referred to physical therapy to assist him in returning to his prior level of functioning.  Mr. Ganas is a runner running marathons, 1/2 marathons and 5 K's stating that whenever he llifts his foot off the pavement is when he has excruciating pain.  Mr. Holliman has not stopped running and  despite urging by the therapist is reluctant to do so.  Exam demonstrates very tight hamstrings, and significantly weakened hamstring and gluteal mm.  Pt was asked by the therapist to come in two or three times a week but pt requests only one time a week.   Pt will benefit from skilled therapeutic intervention in order to improve on the following deficits: Pain;Increased fascial restricitons;Decreased range of motion;Decreased strength Rehab Potential: Good PT Frequency: Min 1X/week PT Duration: 4 weeks PT Treatment/Interventions: Therapeutic activities;Therapeutic exercise;Manual techniques;Modalities PT Plan: Assess pt one time max rep for both Rt and Lt LE .  Pt should be at 65% prior to returning to running.  Pt to begin wb acitvities ie squats , lunges(pushing back slowly);  step ups .Marland [employer].Pt will need new home exercise sheet for these activities.     Goals Home Exercise Program Pt/caregiver will Perform Home Exercise Program: For increased strengthening;For increased ROM PT Goal: Perform Home Exercise Program - Progress: Goal set  today PT Short Term Goals Time to Complete Short Term Goals: 2 weeks PT Short Term Goal 1: pain to be no greater than a 5/10 PT Short Term Goal 2: Pt to be able to jog for 30 minutes without pain PT Short Term Goal 3: Pt strength to be improved by 1/2 grade PT Short Term Goal 4: Pt to be able to sit with comfort without the three inch gel cushion. PT Long Term Goals Time to Complete Long Term Goals: 4 weeks PT Long Term Goal 1: Pt pain to be no greater than a 3/10 PT Long Term Goal 2: Pt to be able to jog for an hour without increased pain. Long Term Goal 3: mm strength to be increased by one grade.  Problem List Patient Active Problem List   Diagnosis Date Noted  . Stiffness of joint, lower leg 06/29/2013  . Strain of hamstring muscle 06/29/2013  . Right hip pain 06/22/2013  . Sciatic nerve pain 04/11/2011  . Gait abnormality 09/26/2010  . Degenerative arthritis of left foot 09/26/2010  . STRESS FRACTURE, FOOT 04/24/2009  . MONONEURITIS, LEG 03/27/2009    PT Plan of Care PT Home Exercise Plan: given   GP    RUSSELL,CINDY 06/29/2013, 3:45 PM  Physician Documentation Your signature is required to indicate approval of the treatment plan as stated above.  Please sign and either send electronically or make a copy of this report for your files and return this physician signed original.   Please mark one 1.__approve of plan  2. ___approve of plan with the following conditions.   ______________________________                                                          _____________________ Physician Signature                                                                                                             Date

## 2013-07-06 ENCOUNTER — Ambulatory Visit (HOSPITAL_COMMUNITY)
Admission: RE | Admit: 2013-07-06 | Discharge: 2013-07-06 | Disposition: A | Discharge status: Home / Self Care | Payer: BC Managed Care – PPO | Source: Ambulatory Visit | Attending: Family Medicine | Admitting: Family Medicine

## 2013-07-06 DIAGNOSIS — G589 Mononeuropathy, unspecified: Secondary | ICD-10-CM | POA: Insufficient documentation

## 2013-07-06 DIAGNOSIS — M25669 Stiffness of unspecified knee, not elsewhere classified: Secondary | ICD-10-CM | POA: Insufficient documentation

## 2013-07-06 DIAGNOSIS — IMO0001 Reserved for inherently not codable concepts without codable children: Secondary | ICD-10-CM | POA: Insufficient documentation

## 2013-07-06 DIAGNOSIS — M5430 Sciatica, unspecified side: Secondary | ICD-10-CM | POA: Insufficient documentation

## 2013-07-06 DIAGNOSIS — S76319A Strain of muscle, fascia and tendon of the posterior muscle group at thigh level, unspecified thigh, initial encounter: Secondary | ICD-10-CM

## 2013-07-06 DIAGNOSIS — R269 Unspecified abnormalities of gait and mobility: Secondary | ICD-10-CM | POA: Insufficient documentation

## 2013-07-06 NOTE — Progress Notes (Signed)
Physical Therapy Treatment Patient Details  Name: Clifford Harris MRN: [id number] Date of Birth: Sep 22, 1955  Today's Date: 07/06/2013 Time: 1100-1200 PT Time Calculation (min): 60 min Charge:  There ex 1100-1150;  [place] [id number] Visit#: 2 of 4  Re-eval: 07/29/13    Authorization: BCBS     Subjective: Symptoms/Limitations Symptoms: Pt reports he has continued to run and fell on Thursday. Pain Assessment Currently in Pain?: Yes Pain Score: 6  (4-6) Pain Location: Buttocks Pain Orientation: Right   Exercise/Treatments      Stretches Active Hamstring Stretch: 3 reps;30 seconds Passive Hamstring Stretch: 1 x 60:"  Knee to chest 3 x 30: Machines for Strengthening Cybex Knee Flexion: 1 Pl x 10 reps.  (unable to do more than one rep without having pain.)   Standing Forward Step Up: Right;15 reps Functional Squat: 10 reps Prone  Hamstring Curl: 10 reps;Limitations Hamstring Curl Limitations: 2# Hip Extension: 15 reps Hip Extension Limitations: knee bent to 60    Modalities Modalities: Ultrasound Ultrasound Ultrasound Location: Rt buttock Ultrasound Parameters: 1 Mg Hz 1.5 w/cm2 x 8:00 Ultrasound Goals: Pain  Physical Therapy Assessment and Plan PT Assessment and Plan Clinical Impression Statement: Pt continues to have pain upon running.  One max rep on Rt  9# Lt 57.5#,(Rt is only 15.6% of Lt strength) PT is still unable to complete prone hip extension without pain.  Pt was able to have knee bent to 60 degrees instead of 90 degrees with hip extension and be painfree  PT Plan: Begin lunging, SLS next visit attempt prone hip ext  with leg straight and ham curl with 4#.    Goals Home Exercise Program Pt/caregiver will Perform Home Exercise Program: For increased strengthening;For increased ROM PT Goal: Perform Home Exercise Program - Progress: Met PT Short Term Goals Time to Complete Short Term Goals: 2 weeks PT Short Term Goal 1: pain to be no greater than a 5/10 PT  Short Term Goal 1 - Progress: Progressing toward goal PT Short Term Goal 2: Pt to be able to jog for 30 minutes without pain PT Short Term Goal 2 - Progress: Not met PT Short Term Goal 3: Pt strength to be improved by 1/2 grade PT Short Term Goal 3 - Progress: Not met PT Short Term Goal 4: Pt to be able to sit with comfort without the three inch gel cushion. PT Short Term Goal 4 - Progress: Not met PT Long Term Goals Time to Complete Long Term Goals: 4 weeks PT Long Term Goal 1: Pt pain to be no greater than a 3/10 PT Long Term Goal 1 - Progress: Not met PT Long Term Goal 2: Pt to be able to jog for an hour without increased pain. PT Long Term Goal 2 - Progress: Not met Long Term Goal 3: mm strength to be increased by one grade. Long Term Goal 3 Progress: Not met  Problem List Patient Active Problem List   Diagnosis Date Noted  . Stiffness of joint, lower leg 06/29/2013  . Strain of hamstring muscle 06/29/2013  . Right hip pain 06/22/2013  . Sciatic nerve pain 04/11/2011  . Gait abnormality 09/26/2010  . Degenerative arthritis of left foot 09/26/2010  . STRESS FRACTURE, FOOT 04/24/2009  . MONONEURITIS, LEG 03/27/2009       GP    RUSSELL,CINDY 07/06/2013, 1:16 PM

## 2013-07-13 ENCOUNTER — Ambulatory Visit (HOSPITAL_COMMUNITY)
Admission: RE | Admit: 2013-07-13 | Discharge: 2013-07-13 | Disposition: A | Discharge status: Home / Self Care | Payer: BC Managed Care – PPO | Source: Ambulatory Visit | Attending: Physical Therapy | Admitting: Physical Therapy

## 2013-07-13 NOTE — Progress Notes (Signed)
Physical Therapy Treatment Patient Details  Name: Clifford CobbleDavid P Douthitt MRN: [id number] Date of Birth: 08/06/1955  Today's Date: 07/13/2013 Time: 1100-1143 PT Time Calculation (min): 43 min Visit#: 3 of 4  Re-eval: 07/29/13 Authorization: BCBS  Charges:  therex 40'  Subjective: Symptoms/Limitations Symptoms: Pt states he was really hurting after last visit.  States didn't do anything the next day but then resumed running 1 mile/day X past 3 days without difficulty.  Pt states he prefers not to do squats as he thinks that flared him up.  Currently without pain. Pain Assessment Currently in Pain?: No/denies   Exercise/Treatments Stretches Active Hamstring Stretch: 3 reps;30 seconds Machines for Strengthening Cybex Knee Extension: 1 PL 10 reps Rt only Cybex Knee Flexion: 1 PL 10 reps Rt only Standing Forward Step Up: Right;15 reps;Step Height: 4" Wall Squat: 10 reps;Limitations Wall Squat Limitations: 10 second holds Prone  Hamstring Curl: 15 reps Hamstring Curl Limitations: 4# Hip Extension: 10 reps Hip Extension Limitations: full extension Other Prone Exercises: heelsqueeze 10X5" holds    Physical Therapy Assessment and Plan PT Assessment and Plan Clinical Impression Statement: Pt able to complete all exercises today without c/o pain.  Changed squats to wallslides without pain. Added 4# weight to prone hamstring curls and completed prone hip extension with knee in full extension.   Pt requires cues for form and holding exercises longer with control.  Pt given updated HEP with SLS, wallslides,  standing hamstring curls and lunges. PT Plan: Progress Rt LE strength while decreasing pain.     Problem List Patient Active Problem List   Diagnosis Date Noted  . Stiffness of joint, lower leg 06/29/2013  . Strain of hamstring muscle 06/29/2013  . Right hip pain 06/22/2013  . Sciatic nerve pain 04/11/2011  . Gait abnormality 09/26/2010  . Degenerative arthritis of left foot 09/26/2010   . STRESS FRACTURE, FOOT 04/24/2009  . MONONEURITIS, LEG 03/27/2009       Lurena NidaAmy B Frazier, PTA/CLT 07/13/2013, 12:05 PM

## 2013-07-20 ENCOUNTER — Ambulatory Visit (HOSPITAL_COMMUNITY)
Admission: RE | Admit: 2013-07-20 | Discharge: 2013-07-20 | Disposition: A | Discharge status: Home / Self Care | Payer: BC Managed Care – PPO | Source: Ambulatory Visit | Attending: Physical Therapy | Admitting: Physical Therapy

## 2013-07-20 NOTE — Evaluation (Signed)
Physical Therapy Reevaluation  Patient Details  Name: RAYBON CONARD MRN: [id number] Date of Birth: February 26, 1956  Today's Date: 07/20/2013 Time: 1106-1130 PT Time Calculation (min): 24 min Charges: MMT x 1 [phone]) Therex x (1115-1130)              Visit#: 4 of 4  Re-eval: 07/29/13 Assessment Diagnosis: Hamstring pull  Authorization: BCBS     Past Medical History:  Past Medical History  Diagnosis Date  . Neuropathy    Past Surgical History: No past surgical history on file.  Subjective Symptoms/Limitations Symptoms: Pt states that he is doing well overall.  Pain Assessment Currently in Pain?: No/denies   Assessment RLE Strength Right Hip Flexion: 5/5 (Last measured 06/29/13: 5/5) Right Hip Extension: 4/5 (Last measured 06/29/13: 3-/5) Right Hip ABduction: 5/5 (Last measured 06/29/13: 5/5) Right Knee Flexion: 5/5 (Last measured 06/29/13: 3+/5) Right Knee Extension: 5/5 (Last measured 06/29/13: 5/5) Right Ankle Dorsiflexion: 5/5 (Last measured 06/29/13: 4/5)  Exercise/Treatments Machines for Strengthening Cybex Knee Extension: 2 PL 10 reps Rt only Cybex Knee Flexion: 2 PL 10 reps Rt only Standing Wall Squat: 10 reps;Limitations Wall Squat Limitations: 10 second holds Supine Bridges: 10 reps Prone  Hamstring Curl: 15 reps Hamstring Curl Limitations: 5# Hip Extension: 15 reps Hip Extension Limitations: full extension Other Prone Exercises: heelsqueeze 10X5" holds  Physical Therapy Assessment and Plan PT Assessment and Plan Clinical Impression Statement: Treatement limited by time as pt needed to leave at 11:30. Pt appears to be progressing well overall. Improved LE strength and reprots of decreased pain noted. Pt has met or is progressing toward all goals.  PT Plan: Await MD order regarding continuing versus discharge.    Goals Home Exercise Program Pt/caregiver will Perform Home Exercise Program: For increased strengthening;For increased ROM PT Short Term  Goals Time to Complete Short Term Goals: 2 weeks PT Short Term Goal 1: pain to be no greater than a 5/10 PT Short Term Goal 1 - Progress: Met PT Short Term Goal 2: Pt to be able to jog for 30 minutes without pain PT Short Term Goal 2 - Progress: Met PT Short Term Goal 3: Pt strength to be improved by 1/2 grade PT Short Term Goal 4: Pt to be able to sit with comfort without the three inch gel cushion. PT Short Term Goal 4 - Progress: Progressing toward goal PT Long Term Goals Time to Complete Long Term Goals: 4 weeks PT Long Term Goal 1: Pt pain to be no greater than a 3/10 PT Long Term Goal 1 - Progress: Progressing toward goal PT Long Term Goal 2: Pt to be able to jog for an hour without increased pain. PT Long Term Goal 2 - Progress: Progressing toward goal Long Term Goal 3: mm strength to be increased by one grade.  Problem List Patient Active Problem List   Diagnosis Date Noted  . Stiffness of joint, lower leg 06/29/2013  . Strain of hamstring muscle 06/29/2013  . Right hip pain 06/22/2013  . Sciatic nerve pain 04/11/2011  . Gait abnormality 09/26/2010  . Degenerative arthritis of left foot 09/26/2010  . STRESS FRACTURE, FOOT 04/24/2009  . MONONEURITIS, LEG 03/27/2009    PT - End of Session Activity Tolerance: Patient tolerated treatment well General Behavior During Therapy: [facility] for tasks assessed/performed  Rachelle Hora, PTA  07/20/2013, 11:45 AM  Physician Documentation Your signature is required to indicate approval of the treatment plan as stated above.  Please sign and either send electronically or make a  copy of this report for your files and return this physician signed original.   Please mark one 1.__approve of plan  2. ___approve of plan with the following conditions.   ______________________________                                                          _____________________ Physician Signature                                                                                                              Date

## 2013-07-21 ENCOUNTER — Encounter (INDEPENDENT_AMBULATORY_CARE_PROVIDER_SITE_OTHER): Payer: Self-pay

## 2013-07-21 ENCOUNTER — Encounter: Payer: Self-pay | Admitting: Family Medicine

## 2013-07-21 ENCOUNTER — Ambulatory Visit (INDEPENDENT_AMBULATORY_CARE_PROVIDER_SITE_OTHER): Payer: BC Managed Care – PPO | Admitting: Family Medicine

## 2013-07-21 ENCOUNTER — Telehealth (HOSPITAL_COMMUNITY): Payer: Self-pay

## 2013-07-21 VITALS — BP 134/76 | HR 52 | Ht 70.0 in | Wt 143.0 lb

## 2013-07-21 DIAGNOSIS — M25559 Pain in unspecified hip: Secondary | ICD-10-CM

## 2013-07-21 DIAGNOSIS — M25551 Pain in right hip: Secondary | ICD-10-CM

## 2013-07-22 ENCOUNTER — Encounter: Payer: Self-pay | Admitting: Family Medicine

## 2013-07-22 NOTE — Progress Notes (Signed)
Patient ID: Clifford Harris, male   DOB: 07/11/1955, 58 y.o.   MRN: [id number]  PCP: Kirk RuthsMCGOUGH,WILLIAM M, MD  Subjective:   HPI: Patient is a 58 y.o. male here for right hip pain.  2/11: Patient reports pain started in December when he was hanging christmas orb lights. He was twisting when he felt a rip or tear sensation in right buttock. Pain comes on with hip extended. Radiates down to knee. No numbness/tingling. Takes neurontin, tramadol, voltaren gel with some benefit. Has continued to run through these injuries - pain worse with running. Using icy hot also. No back pain. No bowel/bladder dysfunction.  3/18: Patient reports he feels much better. Now only bothers with prolonged standing or bends down to lift something that is too heavy. Able to run 30 miles last week without a problem. Doing physical therapy and home exercise program. No numbness/tingling.  Past Medical History  Diagnosis Date  . Neuropathy     Current Outpatient Prescriptions on File Prior to Visit  Medication Sig Dispense Refill  . diclofenac sodium (VOLTAREN) 1 % GEL Apply 2 g topically 4 (four) times daily. To affected joint.  100 g  11  . gabapentin (NEURONTIN) 300 MG capsule Take 1 capsule (300 mg total) by mouth at bedtime.  90 capsule  3  . ibuprofen (ADVIL,MOTRIN) 800 MG tablet Take 1 tablet (800 mg total) by mouth every 8 (eight) hours as needed.  90 tablet  1  . traMADol (ULTRAM) 50 MG tablet TAKE TWO TABLETS THREE TIMES DAILY AS NEEDED FOR PAIN.  180 tablet  1   No current facility-administered medications on file prior to visit.    No past surgical history on file.  No Known Allergies  History   Social History  . Marital Status: Married    Spouse Name: N/A    Number of Children: N/A  . Years of Education: N/A   Occupational History  . Not on file.   Social History Main Topics  . Smoking status: Never Smoker   . Smokeless tobacco: Never Used  . Alcohol Use: Not on file  . Drug  Use: Not on file  . Sexual Activity: Not on file   Other Topics Concern  . Not on file   Social History Narrative  . No narrative on file    Family History  Problem Relation Age of Onset  . Sudden death Neg Hx   . Hypertension Neg Hx   . Hyperlipidemia Neg Hx   . Diabetes Neg Hx   . Heart attack Neg Hx     BP 134/76  Pulse 52  Ht 5\' 10"  (1.778 m)  Wt 143 lb (64.864 kg)  BMI 20.52 kg/m2  Review of Systems: See HPI above.    Objective:  Physical Exam:  Gen: NAD  Back/R hip: No gross deformity, scoliosis. No TTP right buttock over piriformis, hip rotators.  No midline or bony TTP. FROM without pain. Strength LEs 5/5 all muscle groups without pain. 2+ MSRs in patellar and achilles tendons, equal bilaterally. Negative SLRs. Sensation intact to light touch bilaterally. Negative logroll bilateral hips Negative fabers.    Assessment & Plan:  1. Right hip pain - consistent with strain/partial tear of external rotator of hip - possibly piriformis, gemelli.  Much improved with PT, HEP.  Will discontinue PT at this point but continue with HEP.  Nsaids, tennis ball massage as needed.  F/u prn.

## 2013-07-22 NOTE — Assessment & Plan Note (Signed)
consistent with strain/partial tear of external rotator of hip - possibly piriformis, gemelli.  Much improved with PT, HEP.  Will discontinue PT at this point but continue with HEP.  Nsaids, tennis ball massage as needed.  F/u prn.

## 2013-07-27 ENCOUNTER — Ambulatory Visit (HOSPITAL_COMMUNITY): Payer: BC Managed Care – PPO | Admitting: Physical Therapy

## 2013-08-07 ENCOUNTER — Other Ambulatory Visit: Payer: Self-pay | Admitting: Sports Medicine

## 2013-08-09 ENCOUNTER — Other Ambulatory Visit: Payer: Self-pay | Admitting: *Deleted

## 2013-09-28 ENCOUNTER — Other Ambulatory Visit: Payer: Self-pay | Admitting: Family Medicine

## 2013-09-28 NOTE — Telephone Encounter (Signed)
rx called into [facility] ok per Dr. Pearletha Forge.  Spoke with Mardelle Matte at pharmacy.  Aleyah Balik,CMA

## 2013-09-29 NOTE — Progress Notes (Signed)
  Patient Details  Name: Clifford Harris MRN: [id number] Date of Birth: Feb 19, 1956  Today's Date: 09/29/2013 Pt did not return after MD visit on 07/20/2013 at this time pt was running 30 miles a week without pain will discharge.  [name] 09/29/2013, 11:52 AM

## 2013-09-29 NOTE — Addendum Note (Signed)
Encounter addended by: [name], PT on: 09/29/2013 11:53 AM<BR>     Documentation filed: Notes Section, Episodes

## 2013-10-26 ENCOUNTER — Telehealth: Payer: Self-pay | Admitting: *Deleted

## 2013-10-26 NOTE — Telephone Encounter (Signed)
Will have them fax over PA form.   Review coverage number is [phone]

## 2013-11-09 ENCOUNTER — Ambulatory Visit (INDEPENDENT_AMBULATORY_CARE_PROVIDER_SITE_OTHER): Payer: BC Managed Care – PPO | Admitting: Sports Medicine

## 2013-11-09 ENCOUNTER — Encounter: Payer: Self-pay | Admitting: Sports Medicine

## 2013-11-09 VITALS — BP 155/88 | Ht 71.0 in | Wt 145.0 lb

## 2013-11-09 DIAGNOSIS — M19172 Post-traumatic osteoarthritis, left ankle and foot: Secondary | ICD-10-CM

## 2013-11-09 DIAGNOSIS — IMO0002 Reserved for concepts with insufficient information to code with codable children: Secondary | ICD-10-CM

## 2013-11-09 DIAGNOSIS — S76311A Strain of muscle, fascia and tendon of the posterior muscle group at thigh level, right thigh, initial encounter: Secondary | ICD-10-CM

## 2013-11-09 DIAGNOSIS — M19279 Secondary osteoarthritis, unspecified ankle and foot: Secondary | ICD-10-CM

## 2013-11-09 DIAGNOSIS — M25661 Stiffness of right knee, not elsewhere classified: Secondary | ICD-10-CM

## 2013-11-09 DIAGNOSIS — M25669 Stiffness of unspecified knee, not elsewhere classified: Secondary | ICD-10-CM

## 2013-11-09 NOTE — Assessment & Plan Note (Signed)
Start with a hamstring rehabilitation program He is a compression sleeve Heel lift his added to his orthotics on the right  Doing this consistently on a daily basis I would not run if he feels that he has to limp  Reevaluate in 6-8 weeks

## 2013-11-09 NOTE — Progress Notes (Signed)
Patient ID: Clifford CobbleDavid P Philbrick, male   DOB: 03/10/1956, 58 y.o.   MRN: [id number]  Patient is a runner with significant arthritis of his left foot He retired this past year and has been exercising about 3 hours a day He developed a lower buttocks strain last January  Now complaining of right hamstring pain in the middle to lower third He thinks this came from a vigorous bike workout where he was doing a lot of hills Pain is consistent over the last month so he comes for evaluation  He is unable to run because of tightness and pain He tried some of the hamstring exercises given him last winter but these were also painful  He has a history of radicular pain into his legs it has responded to low-dose gabapentin He continues on gabapentin 300 at night  For his arthritis pain he continues take 100 mg of tramadol 3 times a day and with this he can continue to run even though the foot is painful  Examination No acute distress BP 155/88  Ht 5\' 11"  (1.803 m)  Wt 145 lb (65.772 kg)  BMI 20.23 kg/m2  Inspection shows some slight flattening of the distal right biceps femoris There is palpable tenderness to the mid and distal hamstring more laterally No tenderness at the initial tuberosity or at the posterior knee H TEST he can reach 90 but does have pain on the right  Strength testing reveals he has good strength but the right side testing the biceps femoris causes too much pain  Walking gait and running gait shows that he is favoring the right leg

## 2013-11-09 NOTE — Assessment & Plan Note (Signed)
I reviewed his medicines  I think he will need to continue home be tramadol and ibuprofen  I don't have a better solution for his arthritis pain

## 2013-11-12 ENCOUNTER — Other Ambulatory Visit: Payer: Self-pay | Admitting: *Deleted

## 2013-11-12 MED ORDER — GABAPENTIN 300 MG PO CAPS: 300.0000 mg | ORAL_CAPSULE | Freq: Every day | ORAL | Status: DC

## 2013-12-20 ENCOUNTER — Other Ambulatory Visit: Payer: Self-pay | Admitting: *Deleted

## 2013-12-20 ENCOUNTER — Other Ambulatory Visit: Payer: Self-pay | Admitting: Sports Medicine

## 2013-12-20 NOTE — Addendum Note (Signed)
Encounter addended by: Jeanene ErbLaura D Essenmacher, OT on: 12/20/2013  1:08 PM<BR>     Documentation filed: Episodes

## 2014-01-04 ENCOUNTER — Ambulatory Visit (INDEPENDENT_AMBULATORY_CARE_PROVIDER_SITE_OTHER): Payer: BC Managed Care – PPO | Admitting: Sports Medicine

## 2014-01-04 ENCOUNTER — Encounter: Payer: Self-pay | Admitting: Sports Medicine

## 2014-01-04 VITALS — BP 130/67 | Ht 70.0 in | Wt 145.0 lb

## 2014-01-04 DIAGNOSIS — B353 Tinea pedis: Secondary | ICD-10-CM | POA: Diagnosis not present

## 2014-01-04 DIAGNOSIS — M19079 Primary osteoarthritis, unspecified ankle and foot: Secondary | ICD-10-CM

## 2014-01-04 DIAGNOSIS — M19279 Secondary osteoarthritis, unspecified ankle and foot: Secondary | ICD-10-CM

## 2014-01-04 DIAGNOSIS — M19072 Primary osteoarthritis, left ankle and foot: Secondary | ICD-10-CM | POA: Insufficient documentation

## 2014-01-04 DIAGNOSIS — M19172 Post-traumatic osteoarthritis, left ankle and foot: Secondary | ICD-10-CM

## 2014-01-04 DIAGNOSIS — G5790 Unspecified mononeuropathy of unspecified lower limb: Secondary | ICD-10-CM

## 2014-01-04 MED ORDER — DICLOFENAC SODIUM 1 % TD GEL: 2.0000 g | Freq: Four times a day (QID) | TRANSDERMAL | Status: DC

## 2014-01-04 NOTE — Patient Instructions (Signed)
Athletes Foot spray for fungal infection

## 2014-01-04 NOTE — Assessment & Plan Note (Signed)
Continue treatment with gabapentin

## 2014-01-04 NOTE — Assessment & Plan Note (Signed)
Recommended over-the-counter Lamisil spray for tinea pedis

## 2014-01-04 NOTE — Progress Notes (Signed)
  Clifford Harris - 58 y.o. male MRN [id number]  Date of birth: [birth year]  SUBJECTIVE:  Including CC & ROS.  Patient is a 58 year old Caucasian male. Presented today for followup on his right hamstring strain and chronic left foot osteoarthritis of the first MTP joint. Patient is a retired avid runner who runs about 5 miles a day and bikes about 10 miles a day and was consistently do that throughout the month of June and some of July. He reports that his hamstring strain has resolved and he found some clinical benefit from the exercises that strengthen his muscles and the body helix sleeve. He continues to have some clinical benefit from gabapentin and tramadol and voltaren Gel for his osteoarthritis pain. Patient is also complaining of a skin breakdown infection between his third fourth and fifth toe of his left foot in the pain from the toe hammering along the ball of his foot.   ROS: Review of systems otherwise negative except for information present in HPI  HISTORY: Past Medical, Surgical, Social, and Family History Reviewed & Updated per EMR. Pertinent Historical Findings include: Gait abnormalities, previous stress fracture, Oster arthritis of the foot  PHYSICAL EXAM:  VS: BP:130/67 mmHg  HR: bpm  TEMP: ( )  RESP:   HT:5\' 10"  (177.8 cm)   WT:145 lb (65.772 kg)  BMI:20.8 PHYSICAL EXAM: FOOT/ ANKLE EXAM:  General: well nourished Skin of LE: warm; dry, no rashes, lesions, ecchymosis or erythema. Vascular: pulses 2+ bilaterally Neurologically: Sensation to light touch lower extremities equal and intact bilaterally.  Foot/Ankle Exam: Observation - no ecchymosis, no edema, or hematoma present  Palpation: Left foot: Patient has tenderness over the ball of his foot particularly on the third and fourth MTP joints where he has some evidence of hyperextension and hamstring. Patient is mild to moderate hammering of toes 3 through 4 on this left foot and moderate to severe hammering of the second toe  on the left foot. Also has evidence of tinea pedis of the third and fourth web spaces Right foot has significant hammering of the first MTP joint Normal range of motion in the ankle. Decreased stiff range of motion in the toes. Bilateral midfoot collapseNegative Kleiger's Test/ external rotation test with no pain or laxity  Right Hamstring strength 5/5 bialterally  ASSESSMENT & PLAN: See problem based charting & AVS for pt instructions.

## 2014-01-04 NOTE — Assessment & Plan Note (Addendum)
Chronic osteoarthritis pain has improved some encouraged patient to continue treatment with gabapentin tramadol and Voltaren gel. First patient to decrease mildly to by running every other day no more than 5 miles. He can transition to elliptical as an alternative for crosstraining. Biking is also reasonable for crosstraining

## 2014-01-13 ENCOUNTER — Other Ambulatory Visit: Payer: Self-pay | Admitting: Family Medicine

## 2014-01-21 ENCOUNTER — Other Ambulatory Visit: Payer: Self-pay | Admitting: Sports Medicine

## 2014-01-24 ENCOUNTER — Other Ambulatory Visit: Payer: Self-pay | Admitting: *Deleted

## 2014-01-24 MED ORDER — GABAPENTIN 300 MG PO CAPS: 300.0000 mg | ORAL_CAPSULE | Freq: Every day | ORAL | Status: DC

## 2014-03-30 ENCOUNTER — Other Ambulatory Visit: Payer: Self-pay | Admitting: Sports Medicine

## 2014-05-02 ENCOUNTER — Other Ambulatory Visit: Payer: Self-pay | Admitting: *Deleted

## 2014-05-02 ENCOUNTER — Other Ambulatory Visit: Payer: Self-pay | Admitting: Sports Medicine

## 2014-05-02 MED ORDER — TRAMADOL HCL 50 MG PO TABS: ORAL_TABLET | ORAL | Status: DC

## 2014-06-01 ENCOUNTER — Other Ambulatory Visit: Payer: Self-pay | Admitting: *Deleted

## 2014-06-01 ENCOUNTER — Other Ambulatory Visit: Payer: Self-pay | Admitting: Sports Medicine

## 2014-06-01 MED ORDER — TRAMADOL HCL 50 MG PO TABS: ORAL_TABLET | ORAL | Status: DC

## 2014-06-07 ENCOUNTER — Other Ambulatory Visit (HOSPITAL_COMMUNITY): Payer: Self-pay | Admitting: Family Medicine

## 2014-06-07 ENCOUNTER — Ambulatory Visit (HOSPITAL_COMMUNITY)
Admission: RE | Admit: 2014-06-07 | Discharge: 2014-06-07 | Disposition: A | Discharge status: Home / Self Care | Payer: BC Managed Care – PPO | Source: Ambulatory Visit | Attending: Family Medicine | Admitting: Family Medicine

## 2014-06-07 DIAGNOSIS — M79672 Pain in left foot: Secondary | ICD-10-CM | POA: Insufficient documentation

## 2014-08-05 ENCOUNTER — Other Ambulatory Visit (HOSPITAL_COMMUNITY): Payer: Self-pay | Admitting: Family Medicine

## 2014-08-05 ENCOUNTER — Ambulatory Visit (HOSPITAL_COMMUNITY)
Admission: RE | Admit: 2014-08-05 | Discharge: 2014-08-05 | Disposition: A | Discharge status: Home / Self Care | Payer: BC Managed Care – PPO | Source: Ambulatory Visit | Attending: Family Medicine | Admitting: Family Medicine

## 2014-08-05 DIAGNOSIS — M5412 Radiculopathy, cervical region: Secondary | ICD-10-CM

## 2014-11-28 ENCOUNTER — Other Ambulatory Visit: Payer: Self-pay | Admitting: *Deleted

## 2014-11-28 DIAGNOSIS — M19172 Post-traumatic osteoarthritis, left ankle and foot: Secondary | ICD-10-CM

## 2014-11-28 MED ORDER — DICLOFENAC SODIUM 1 % TD GEL: 2.0000 g | Freq: Four times a day (QID) | TRANSDERMAL | Status: DC

## 2015-01-30 ENCOUNTER — Other Ambulatory Visit: Payer: Self-pay | Admitting: Sports Medicine

## 2015-04-06 DIAGNOSIS — M5030 Other cervical disc degeneration, unspecified cervical region: Secondary | ICD-10-CM | POA: Insufficient documentation

## 2015-04-14 ENCOUNTER — Ambulatory Visit (HOSPITAL_COMMUNITY): Payer: BC Managed Care – PPO | Attending: Family Medicine

## 2015-04-14 DIAGNOSIS — M542 Cervicalgia: Secondary | ICD-10-CM | POA: Insufficient documentation

## 2015-04-14 DIAGNOSIS — R200 Anesthesia of skin: Secondary | ICD-10-CM | POA: Insufficient documentation

## 2015-04-14 DIAGNOSIS — M25412 Effusion, left shoulder: Secondary | ICD-10-CM

## 2015-04-14 DIAGNOSIS — R202 Paresthesia of skin: Secondary | ICD-10-CM

## 2015-04-14 DIAGNOSIS — M25532 Pain in left wrist: Secondary | ICD-10-CM | POA: Diagnosis present

## 2015-04-16 NOTE — Therapy (Signed)
[facility] [address] [place], [place], [address] Phone: [phone]   Fax:  [phone]  Physical Therapy Evaluation  Patient Details  Name: Clifford Harris MRN: [id number] Date of Birth: 03/27/1956 No Data Recorded  Encounter Date: 04/14/2015      PT End of Session - 04/16/15 0928    Visit Number 1   Number of Visits 1   Date for PT Re-Evaluation 05/15/15   Authorization Type BCBS   Authorization Time Period 04/14/15-06/15/15   Authorization - Visit Number 1   PT Start Time 1435   PT Stop Time 1523   PT Time Calculation (min) 48 min   Activity Tolerance Patient limited by pain;Patient tolerated treatment well  MFR to L forearm is uncomfortable.    Behavior During Therapy [facility] for tasks assessed/performed      Past Medical History  Diagnosis Date  . Neuropathy     No past surgical history on file.  There were no vitals filed for this visit.  Visit Diagnosis:  Pain in joint, forearm, left  Numbness and tingling in left upper extremity  Neck pain  Effusion of clavicular joint, left      Subjective Assessment - 04/16/15 0916    Subjective Pt is a 59yo white male with a history of chronic intermittent neck pain, and chronic intermittent LUE numbness. Pt reports he had some locking up of his neck about 9 months ago. He has since seen neuro and had MRI that was unremarkable. Intermittent left lower extremity numbness is predominantly distal to the cubital fossa, which he notices insidiously throughout the day, but is able to reposition or shake arm and resolve almost immediately. Neck feels achy in lower cervical spine, but is often worse at night. Pt used to be a back sleeper, tried to sleep on side, with no real change.  Has been doing elliptical more often, but pain is really difficult to say. Pt also reporting some L claviculat pain with palpation only.    Pertinent History Pt is an avid runner and exerciser, now performing  2-3 hours of exercises daily, including 100 sit-ups and many push-ups as well. Pt performs the eliptical regularly for cross training, and spends very little time on the computer daily (~2 hours total).    Patient Stated Goals Resolve L arm paresthesia, improve neck pain.    Currently in Pain? No/denies            [facility] - 04/16/15 0001    Assessment   Medical Diagnosis Neck pain    Onset Date/Surgical Date 06/16/14   Hand Dominance Right   Prior Therapy None   Precautions   Precautions None   Restrictions   Weight Bearing Restrictions No   Balance Screen   Has the patient fallen in the past 6 months No   Has the patient had a decrease in activity level because of a fear of falling?  No   Is the patient reluctant to leave their home because of a fear of falling?  No   Prior Function   Level of Independence Independent   Vocation Retired   Leisure Running, exercise, taking care of two puppies   Cognition   Overall Cognitive Status Within Functional Limits for tasks assessed     Posture: WNL, good alignment of mastoid over glenoid, over trochanter, over malleolus. Symmetrical weight bearing. Sits with mild-moderate forward head posture while seated on plinth.   Mobility Screening of BUE:  WNL,  good symmetry and scapular mobility.    ROM:  Cervical Flexion 40 degrees Cervical Extension 45 degrees Cervical Side-bending: 45 degrees bilaterally Right Cervical Rotation:  75 degrees Left Cervical Rotation: 80 degrees   Soft tissue Assessment:  Mildly tight R scalenes, Pecs major tight bilaterally but in neutral posture, L forearm hypertonicity in volar and dorsal side, painful with attempted palpation of pronator teres.   Joint mobility:  -L sternoclavicular joint, moderately effused, with mild restrictions at end range shoulder flexion.  -R elbow limited by 10 degrees end range extension with boggy end feel, non-painful -L wrist supination is limited and very  springy after 30 degrees  Strength Testing:  -MMT screening of BUE reveals all to be symmetrical and strong  Light Touch Sensation:  -Appears to be intact          PT Education - 04/16/15 0926    Education provided Yes   Education Details Explained exam findings do not connect neck pain with LUE pain; discussed general tightness in L forearm and potential nerve compression, and future education of core exercises more specific to running that would not put as much stress on the neck.    Person(s) Educated Patient   Methods Explanation   Comprehension Verbalized understanding;Returned demonstration;Need further instruction          PT Short Term Goals - 04/16/15 1419    PT SHORT TERM GOAL #1   Title After 2 weeks pt will be independent in starter HEP to help improve mobility deficits and recurrent pain and paresthesia.    PT SHORT TERM GOAL #2   Title After 3 weeks patient will demonstrate improved soft tissue compliance in Left forearm to reduce episodes of numbness to fewer than 3/week.    PT SHORT TERM GOAL #3   Title After 3 weeks patient will report improved ability to modify sleep positions in order to reduce nightly neck pain to fewer than 3 nights per week.            PT Long Term Goals - 04/16/15 1422    PT LONG TERM GOAL #1   Title After 6 weeks pt will demonstrate an advanced HEP for DC centred around running specific core training that can be performed with neurtal cervical spine postures.    PT LONG TERM GOAL #2   Title After 6 weeks pt will demonstrate full ROM in L wrist supination with normal end feel to demonstrate relief of tight myofascial/muscular restrictions.    PT LONG TERM GOAL #3   Title After 6 weeks patient will report improved tolerance to all IADL, ADL, and leisure activities without exacerbation of neck pain.                Plan - 04/16/15 0931    Clinical Impression Statement Evaluation today focusing on both neck pain and LUE  paresthesia complaints. All strength, ROM, and light touch sensation in intact. Soft tisseu assessment reveals moderate tightness in bilat pecs major, LUE pronators creating a springy , tight resistance to supination, and boggy end feel with 10 degree deficit in R elbow extension that is pain free. L sternoclavicular joint is also visibly swollen and tender to palpation. Cervical spine ROM is mildly limited globally, but symetrical and nonprovacative. Posture is WNL. History and examination findings create concern for repetitive stress associated with daily situps inhigh volume, hence education will focus on this, and treatment will address limitations in Left forearm.     Pt will benefit from  skilled therapeutic intervention in order to improve on the following deficits Decreased activity tolerance;Pain;Hypomobility;Decreased range of motion   Rehab Potential Good   PT Frequency 3x / week   PT Duration 4 weeks   PT Treatment/Interventions Moist Heat;Therapeutic exercise;Therapeutic activities;Manual techniques;Patient/family education;Taping   PT Next Visit Plan Assess segmental mobility to cervical and thoracic spine, MFR to forearm/pronator teres; cervical retraction into pillow.    PT Home Exercise Plan Given pronator teres stretches; please review.    Consulted and Agree with Plan of Care Patient         Problem List Patient Active Problem List   Diagnosis Date Noted  . Tinea pedis of left foot 01/04/2014  . Osteoarthritis of foot, left 01/04/2014  . Stiffness of joint, lower leg 06/29/2013  . Strain of hamstring muscle 06/29/2013  . Right hip pain 06/22/2013  . Sciatic nerve pain 04/11/2011  . Gait abnormality 09/26/2010  . STRESS FRACTURE, FOOT 04/24/2009  . MONONEURITIS, LEG 03/27/2009    Harris,Clifford C 04/16/2015, 2:28 PM  2:28 PM  Rosamaria Lints, PT, DPT [place] License # [id number]       [facility] [address] [place],  [place], [address] Phone: [phone]   Fax:  [phone]  Name: Clifford Harris MRN: [id number] Date of Birth: Feb 06, 1956

## 2015-04-16 NOTE — Addendum Note (Signed)
Addended by: Rosamaria LintsBUCCOLA, Amedio Bowlby C on: 04/16/2015 02:42 PM   Modules accepted: Orders

## 2015-04-16 NOTE — Patient Instructions (Signed)
Manual Stretch of L forearm into supination with 30 second hold, 3x.

## 2015-04-17 ENCOUNTER — Ambulatory Visit (HOSPITAL_COMMUNITY): Payer: BC Managed Care – PPO | Admitting: Physical Therapy

## 2015-04-17 DIAGNOSIS — M25412 Effusion, left shoulder: Secondary | ICD-10-CM

## 2015-04-17 DIAGNOSIS — M25532 Pain in left wrist: Secondary | ICD-10-CM | POA: Diagnosis not present

## 2015-04-17 DIAGNOSIS — R202 Paresthesia of skin: Secondary | ICD-10-CM

## 2015-04-17 DIAGNOSIS — M542 Cervicalgia: Secondary | ICD-10-CM

## 2015-04-17 DIAGNOSIS — R200 Anesthesia of skin: Secondary | ICD-10-CM

## 2015-04-17 NOTE — Patient Instructions (Signed)
Wrist Pronators: Forearm Inward Rotation  Flexibility: Corner Stretch    Standing in corner with hands just above shoulder level and feet _12___ inches from corner, lean forward until a comfortable stretch is felt across chest. Hold __20__ seconds. Repeat __3__ times per set. Do __1__ sets per session. Do __2__ sessions per day.  http://orth.exer.us/342   Copyright  VHI. All rights reserved.   With one elbow tucked into rib cage, use other hand to turn arm outward. Hold _20__ seconds. Repeat _3__ times per session. Do _3__ sessions per day.  Copyright  VHI. All rights reserved.

## 2015-04-17 NOTE — Addendum Note (Signed)
Addended by: Rosamaria LintsBUCCOLA, ALLAN C on: 04/17/2015 09:15 AM   Modules accepted: Orders

## 2015-04-17 NOTE — Therapy (Signed)
[facility] [address] [place], [place], [address] Phone: [phone]   Fax:  [phone]  Physical Therapy Treatment  Patient Details  Name: Clifford Harris MRN: [id number] Date of Birth: Mar 03, 1956 No Data Recorded  Encounter Date: 04/17/2015      PT End of Session - 04/17/15 1508    Visit Number 2   Number of Visits 12   Date for PT Re-Evaluation 05/15/15   Authorization Type BCBS   Authorization Time Period 04/14/15-06/15/15   PT Start Time 1304   PT Stop Time 1345   PT Time Calculation (min) 41 min   Activity Tolerance Patient limited by pain;Patient tolerated treatment well  MFR to L forearm is uncomfortable.    Behavior During Therapy [facility] for tasks assessed/performed      Past Medical History  Diagnosis Date  . Neuropathy     No past surgical history on file.  There were no vitals filed for this visit.  Visit Diagnosis:  Pain in joint, forearm, left  Numbness and tingling in left upper extremity  Neck pain  Effusion of clavicular joint, left      Subjective Assessment - 04/17/15 1504    Subjective PT states he continues to have numb episodes in his Lt forearm and discomfort in his neck.  Currently without pain or paresthesia   Currently in Pain? No/denies                         [facility] Adult PT Treatment/Exercise - 04/17/15 0001    Shoulder Exercises: Seated   Other Seated Exercises supination stretch 3X20 sec   Shoulder Exercises: Standing   Other Standing Exercises corner stretch 3X20"   Manual Therapy   Manual Therapy Soft tissue mobilization   Manual therapy comments seated   Soft tissue mobilization upper traps and Lt forearm                PT Education - 04/17/15 1508    Education provided Yes   Education Details Symptom discussion, HEP, initial evaluation and overall plan of care.     Person(s) Educated Patient   Methods Explanation;Handout   Comprehension  Verbalized understanding          PT Short Term Goals - 04/16/15 1419    PT SHORT TERM GOAL #1   Title After 2 weeks pt will be independent in starter HEP to help improve mobility deficits and recurrent pain and paresthesia.    PT SHORT TERM GOAL #2   Title After 3 weeks patient will demonstrate improved soft tissue compliance in Left forearm to reduce episodes of numbness to fewer than 3/week.    PT SHORT TERM GOAL #3   Title After 3 weeks patient will report improved ability to modify sleep positions in order to reduce nightly neck pain to fewer than 3 nights per week.            PT Long Term Goals - 04/16/15 1422    PT LONG TERM GOAL #1   Title After 6 weeks pt will demonstrate an advanced HEP for DC centred around running specific core training that can be performed with neurtal cervical spine postures.    PT LONG TERM GOAL #2   Title After 6 weeks pt will demonstrate full ROM in L wrist supination with normal end feel to demonstrate relief of tight myofascial/muscular restrictions.    PT LONG TERM GOAL #3   Title After 6  weeks patient will report improved tolerance to all IADL, ADL, and leisure activities without exacerbation of neck pain.                Plan - 04/17/15 1510    Clinical Impression Statement PT with general confusion regarding HEP and plan of care.  Spent a large time discussing this and overall symptoms and possible causes of this.  No tighness, pain or reproduction of symptoms with manual in forearm region.  Noted tightness in bilateral cervical musculature and  general cervical mobility with therex.  Added cervical excursions, corner stretch  and supination stretch.  Pt reported need for the corner stretch as this was very tight.  Pt given these two stretches for HEP.    PT Next Visit Plan Assess segmental mobility to cervical and thoracic spine when therapist available.  Continue with manual techniques.     PT Home Exercise Plan Given pronator teres  stretches; please review.    Consulted and Agree with Plan of Care Patient        Problem List Patient Active Problem List   Diagnosis Date Noted  . Tinea pedis of left foot 01/04/2014  . Osteoarthritis of foot, left 01/04/2014  . Stiffness of joint, lower leg 06/29/2013  . Strain of hamstring muscle 06/29/2013  . Right hip pain 06/22/2013  . Sciatic nerve pain 04/11/2011  . Gait abnormality 09/26/2010  . STRESS FRACTURE, FOOT 04/24/2009  . MONONEURITIS, LEG 03/27/2009    Clifford Harris, PTA/CLT [phone] 04/17/2015, 3:15 PM  [place] [facility] [address] [place], [place], [address] Phone: [phone]   Fax:  [phone]  Name: Clifford Harris MRN: [id number] Date of Birth: 04/25/1956

## 2015-04-18 ENCOUNTER — Ambulatory Visit (HOSPITAL_COMMUNITY): Payer: BC Managed Care – PPO | Admitting: Physical Therapy

## 2015-04-18 DIAGNOSIS — M25532 Pain in left wrist: Secondary | ICD-10-CM | POA: Diagnosis not present

## 2015-04-18 DIAGNOSIS — R202 Paresthesia of skin: Secondary | ICD-10-CM

## 2015-04-18 DIAGNOSIS — M542 Cervicalgia: Secondary | ICD-10-CM

## 2015-04-18 DIAGNOSIS — M25412 Effusion, left shoulder: Secondary | ICD-10-CM

## 2015-04-18 DIAGNOSIS — R200 Anesthesia of skin: Secondary | ICD-10-CM

## 2015-04-18 NOTE — Therapy (Signed)
[facility] [address] [place], [place], [address] Phone: [phone]   Fax:  [phone]  Physical Therapy Treatment  Patient Details  Name: Clifford CobbleDavid P Harris MRN: [id number] Date of Birth: 12/28/1955 No Data Recorded  Encounter Date: 04/18/2015      PT End of Session - 04/18/15 1747    Visit Number 3   Number of Visits 12   Date for PT Re-Evaluation 05/15/15   Authorization Type BCBS   Authorization Time Period 04/14/15-06/15/15   PT Start Time 1606   PT Stop Time 1647   PT Time Calculation (min) 41 min   Activity Tolerance Patient tolerated treatment well   Behavior During Therapy [facility] for tasks assessed/performed      Past Medical History  Diagnosis Date  . Neuropathy     No past surgical history on file.  There were no vitals filed for this visit.  Visit Diagnosis:  Pain in joint, forearm, left  Numbness and tingling in left upper extremity  Neck pain  Effusion of clavicular joint, left      Subjective Assessment - 04/18/15 1606    Subjective Patient does quite a bit of running, past spring had neck problems in which his neck just froze up. Has not had and does not want surgery. Ended up having an OK MRI per MD. Was told by MD not to put any pressure on R UE. Having numbness currently  grossly from L elbow.    Pertinent History Pt is an avid runner and exerciser, now performing 2-3 hours of exercises daily, including 100 sit-ups and many push-ups as well. Pt performs the eliptical regularly for cross training, and spends very little time on the computer daily (~2 hours total).    Patient Stated Goals Resolve L arm paresthesia, improve neck pain.    Currently in Pain? No/denies            [facility] - 04/18/15 0001    Observation/Other Assessments   Observations ROOS negative; slight reduction in sensation with cubital fossa/ulnar nerve compression L UE; 1st rib mobs very positive and tight; capillary  refill 2-3 seconds each side; tenderness proximal L clavicle superior and does not feel soft tissue; no aggravation cervical distraction and compression; negative vertebral artery test; pec strength 4/5; unable to provoke tendereness SCM/pecs/scalenes                      [facility] Adult PT Treatment/Exercise - 04/18/15 0001    Shoulder Exercises: Seated   Other Seated Exercises cervical and thoracic excursions 1x15   Shoulder Exercises: Stretch   Corner Stretch 3 reps;30 seconds                PT Education - 04/18/15 1746    Education provided Yes   Education Details findings from today, plan of care moving forward, recommendation for NCV and advised that will send note to MD    Person(s) Educated Patient   Methods Explanation   Comprehension Verbalized understanding          PT Short Term Goals - 04/16/15 1419    PT SHORT TERM GOAL #1   Title After 2 weeks pt will be independent in starter HEP to help improve mobility deficits and recurrent pain and paresthesia.    PT SHORT TERM GOAL #2   Title After 3 weeks patient will demonstrate improved soft tissue compliance in Left forearm to reduce episodes of numbness to fewer than  3/week.    PT SHORT TERM GOAL #3   Title After 3 weeks patient will report improved ability to modify sleep positions in order to reduce nightly neck pain to fewer than 3 nights per week.            PT Long Term Goals - 04/16/15 1422    PT LONG TERM GOAL #1   Title After 6 weeks pt will demonstrate an advanced HEP for DC centred around running specific core training that can be performed with neurtal cervical spine postures.    PT LONG TERM GOAL #2   Title After 6 weeks pt will demonstrate full ROM in L wrist supination with normal end feel to demonstrate relief of tight myofascial/muscular restrictions.    PT LONG TERM GOAL #3   Title After 6 weeks patient will report improved tolerance to all IADL, ADL, and leisure activities without  exacerbation of neck pain.                Plan - 04/18/15 1747    Clinical Impression Statement Focused today on performing specific functional tests to rule in and/or rule out other conditions or syndromes contributing to patient's symptoms. See assess section in note for details. Did perform some functional stretches and exercises today as well. Upon assessment PT does have some concerns regarding three separate conditions as unable to provoke or relive symptoms in each spot as would be expected for a less complex and more typical diagnosis, and will message MD regarding this. Recommend NCV at this time if able. Patient will continue with skilled PT services to attempt to address functional limitations and monitor condition.    Pt will benefit from skilled therapeutic intervention in order to improve on the following deficits Decreased activity tolerance;Pain;Hypomobility;Decreased range of motion   Rehab Potential Good   PT Frequency 3x / week   PT Duration 4 weeks   PT Treatment/Interventions Moist Heat;Therapeutic exercise;Therapeutic activities;Manual techniques;Patient/family education;Taping   PT Next Visit Plan Functional postural stretches and psotural strength focus, continue to monitor symptoms, f/u with MD.    PT Home Exercise Plan Given pronator teres stretches; please review.    Consulted and Agree with Plan of Care Patient        Problem List Patient Active Problem List   Diagnosis Date Noted  . Tinea pedis of left foot 01/04/2014  . Osteoarthritis of foot, left 01/04/2014  . Stiffness of joint, lower leg 06/29/2013  . Strain of hamstring muscle 06/29/2013  . Right hip pain 06/22/2013  . Sciatic nerve pain 04/11/2011  . Gait abnormality 09/26/2010  . STRESS FRACTURE, FOOT 04/24/2009  . MONONEURITIS, LEG 03/27/2009    [name] PT, DPT [phone]  [facility] [address] [place], [place], [address] Phone:  [phone]   Fax:  [phone]  Name: Clifford Harris MRN: [id number] Date of Birth: 09/21/1955

## 2015-04-19 ENCOUNTER — Telehealth (HOSPITAL_COMMUNITY): Payer: Self-pay | Admitting: Physical Therapy

## 2015-04-19 NOTE — Telephone Encounter (Signed)
Left message for Dr. Phillips OdorGolding. Reported that patient was referred to PT by another MD but had requested that PT talk to Dr. Phillips OdorGolding about this as he has appointment coming up in January with Dr. Phillips OdorGolding. Explained why patient being treated at PT and curious pattern of numbness L UE, asked if Dr. Phillips OdorGolding would be open to possibly ordering nerve conduction velocity test to further investigate numbness. Left call back number of front desk of clinic and also reported that [facility] can [facility] as well.   Nedra HaiKristen Unger PT, DPT [phone]

## 2015-04-19 NOTE — Telephone Encounter (Signed)
Returned Dr. Lamar BlinksGolding's office's phone call. Per office staff, Dr. Phillips OdorGolding reports that NCV tests are usually done by neurologists and he also mentions that patient may be working with Dr. Teressa SenterSypher, who from what office staff can tell is an orthopedic and hand specialist.   At this time therapy staff will follow up with patient with Dr. Lamar BlinksGolding's response and determine course of care moving forward in terms of attempting to have NCV performed next therapy session.   Nedra HaiKristen Maxx Calaway PT, DPT [phone]

## 2015-04-25 ENCOUNTER — Ambulatory Visit (HOSPITAL_COMMUNITY): Payer: BC Managed Care – PPO | Admitting: Physical Therapy

## 2015-04-25 DIAGNOSIS — M542 Cervicalgia: Secondary | ICD-10-CM

## 2015-04-25 DIAGNOSIS — M25532 Pain in left wrist: Secondary | ICD-10-CM | POA: Diagnosis not present

## 2015-04-25 DIAGNOSIS — M25412 Effusion, left shoulder: Secondary | ICD-10-CM

## 2015-04-25 DIAGNOSIS — R200 Anesthesia of skin: Secondary | ICD-10-CM

## 2015-04-25 DIAGNOSIS — R202 Paresthesia of skin: Secondary | ICD-10-CM

## 2015-04-25 NOTE — Therapy (Signed)
[facility] [address] [place], [place], [address] Phone: [phone]   Fax:  [phone]  Physical Therapy Treatment  Patient Details  Name: Clifford Harris MRN: [id number] Date of Birth: 06/18/1955 No Data Recorded  Encounter Date: 04/25/2015      Harris End of Session - 04/25/15 1310    Visit Number 4   Number of Visits 12   Date for Harris Re-Evaluation 05/15/15   Authorization Type BCBS   Authorization Time Period 04/14/15-06/15/15   Harris Start Time 1021   Harris Stop Time 1102   Harris Time Calculation (min) 41 min   Activity Tolerance Patient tolerated treatment well   Behavior During Therapy [facility] for tasks assessed/performed      Past Medical History  Diagnosis Date  . Neuropathy     No past surgical history on file.  There were no vitals filed for this visit.  Visit Diagnosis:  Pain in joint, forearm, left  Numbness and tingling in left upper extremity  Neck pain  Effusion of clavicular joint, left      Subjective Assessment - 04/25/15 1021    Subjective Patient doing well today, no change in symptoms; patient also reports that when [place] in [place] and went to see a guy who told him that his neck was in very bad shape, will bring in this image next session. Reports that he is thinking of pushing next MD appointment back.    Pertinent History Harris is an avid runner and exerciser, now performing 2-3 hours of exercises daily, including 100 sit-ups and many push-ups as well. Harris performs the eliptical regularly for cross training, and spends very little time on the computer daily (~2 hours total).    Patient Stated Goals Resolve L arm paresthesia, improve neck pain.    Currently in Pain? Yes   Pain Score 5    Pain Location Shoulder   Pain Orientation --  both, in bones of shoulder itself per patient                          [facility] Harris Treatment/Exercise - 04/25/15 0001    Shoulder Exercises:  Seated   Horizontal ABduction 15 reps   Theraband Level (Shoulder Horizontal ABduction) Level 2 (Red)   Other Seated Exercises cervical and thoracic excursions 1x15; cervical retractions 1x15; backwards shoulder rolls 1x15    Other Seated Exercises twisting bead rod (wrist flexion/extesnsion) and twisting with 2.5 pound weight; supinatin/pronation with 1# weight    Shoulder Exercises: Standing   Extension 15 reps   Theraband Level (Shoulder Extension) Level 3 (Green)   Retraction 15 reps   Theraband Level (Shoulder Retraction) Level 3 (Green)   Other Standing Exercises punches with green therband 1x15    Neck Exercises: Stretches   Upper Trapezius Stretch 2 reps;30 seconds   Upper Trapezius Stretch Limitations seated    Other Neck Stretches 90/90 stretch 2x30 seconds    Other Neck Stretches forearm flexor stretch 2x30 seconds                 Harris Education - 04/25/15 1310    Education provided No          Harris Short Term Goals - 04/16/15 1419    Harris SHORT TERM GOAL #1   Title After 2 weeks Harris will be independent in starter HEP to help improve mobility deficits and recurrent pain and paresthesia.  Harris SHORT TERM GOAL #2   Title After 3 weeks patient will demonstrate improved soft tissue compliance in Left forearm to reduce episodes of numbness to fewer than 3/week.    Harris SHORT TERM GOAL #3   Title After 3 weeks patient will report improved ability to modify sleep positions in order to reduce nightly neck pain to fewer than 3 nights per week.            Harris Long Term Goals - 04/16/15 1422    Harris LONG TERM GOAL #1   Title After 6 weeks Harris will demonstrate an advanced HEP for DC centred around running specific core training that can be performed with neurtal cervical spine postures.    Harris LONG TERM GOAL #2   Title After 6 weeks Harris will demonstrate full ROM in L wrist supination with normal end feel to demonstrate relief of tight myofascial/muscular restrictions.    Harris LONG  TERM GOAL #3   Title After 6 weeks patient will report improved tolerance to all IADL, ADL, and leisure activities without exacerbation of neck pain.                Plan - 04/25/15 1311    Clinical Impression Statement Increaed focus on functional exercises and posture today with good tolerance by aptient however he does continue to display quite a bit of muscle tigntess especially in pectoral region and has quite a bit of muscle discomfort during functional stretches. Also introduced exercises to bring posterior musculature into play and reduce tension of pectorals as well today. Good performance and tolerance of session by patient.    Harris will benefit from skilled therapeutic intervention in order to improve on the following deficits Decreased activity tolerance;Pain;Hypomobility;Decreased range of motion   Rehab Potential Good   Harris Frequency 3x / week   Harris Duration 4 weeks   Harris Treatment/Interventions Moist Heat;Therapeutic exercise;Therapeutic activities;Manual techniques;Patient/family education;Taping   Harris Next Visit Plan Functional postural stretches and psotural strength focus, continue to monitor symptoms, f/u with MD.    Harris Home Exercise Plan Given pronator teres stretches; please review.    Consulted and Agree with Plan of Care Patient        Problem List Patient Active Problem List   Diagnosis Date Noted  . Tinea pedis of left foot 01/04/2014  . Osteoarthritis of foot, left 01/04/2014  . Stiffness of joint, lower leg 06/29/2013  . Strain of hamstring muscle 06/29/2013  . Right hip pain 06/22/2013  . Sciatic nerve pain 04/11/2011  . Gait abnormality 09/26/2010  . STRESS FRACTURE, FOOT 04/24/2009  . MONONEURITIS, LEG 03/27/2009    Clifford Harris, DPT [phone]  [facility] [address] [place], [place], [address] Phone: [phone]   Fax:  [phone]  Name: KHYAN OATS MRN: [id number] Date of Birth:  [birth year]

## 2015-04-26 ENCOUNTER — Ambulatory Visit (HOSPITAL_COMMUNITY): Payer: BC Managed Care – PPO | Admitting: Physical Therapy

## 2015-04-26 DIAGNOSIS — M542 Cervicalgia: Secondary | ICD-10-CM

## 2015-04-26 DIAGNOSIS — M25532 Pain in left wrist: Secondary | ICD-10-CM

## 2015-04-26 DIAGNOSIS — M25412 Effusion, left shoulder: Secondary | ICD-10-CM

## 2015-04-26 DIAGNOSIS — R200 Anesthesia of skin: Secondary | ICD-10-CM

## 2015-04-26 DIAGNOSIS — R202 Paresthesia of skin: Secondary | ICD-10-CM

## 2015-04-26 NOTE — Therapy (Signed)
[place] [facility] [address] [place], [place], [address] Phone: [phone]   Fax:  [phone]  Physical Therapy Treatment  Patient Details  Name: Karren CobbleDavid P Buehler MRN: [id number] Date of Birth: 11/25/1955 No Data Recorded  Encounter Date: 04/26/2015      PT End of Session - 04/26/15 1738    Visit Number 5   Number of Visits 12   Date for PT Re-Evaluation 05/15/15   Authorization Type BCBS   Authorization Time Period 04/14/15-06/15/15   PT Start Time 1603   PT Stop Time 1645   PT Time Calculation (min) 42 min   Activity Tolerance Patient tolerated treatment well   Behavior During Therapy [facility] for tasks assessed/performed      Past Medical History  Diagnosis Date  . Neuropathy     No past surgical history on file.  There were no vitals filed for this visit.  Visit Diagnosis:  Pain in joint, forearm, left  Numbness and tingling in left upper extremity  Neck pain  Effusion of clavicular joint, left      Subjective Assessment - 04/26/15 1608    Subjective Patient doing well today, curious about sEMG and chiropractors and very pleasant overall. Was working outside today and is very sore today.    Pertinent History Pt is an avid runner and exerciser, now performing 2-3 hours of exercises daily, including 100 sit-ups and many push-ups as well. Pt performs the eliptical regularly for cross training, and spends very little time on the computer daily (~2 hours total).    Patient Stated Goals Resolve L arm paresthesia, improve neck pain.    Currently in Pain? Yes   Pain Score 5    Pain Location Shoulder   Pain Orientation Right;Left                         [facility] Adult PT Treatment/Exercise - 04/26/15 0001    Shoulder Exercises: Seated   Other Seated Exercises cervical and thoracic excursions 1x15; cervical retractions 1x15; backwards shoulder rolls 1x15    Manual Therapy   Manual Therapy Soft tissue  mobilization;Joint mobilization   Manual therapy comments performed after all other functional exercises today    Joint Mobilization 1st rib mobs both sides grade 2-3    Soft tissue mobilization L forearm generally over area of pronator    Neck Exercises: Stretches   Corner Stretch 3 reps;30 seconds   Other Neck Stretches 90/90 stretch 2x30 seconds                 PT Education - 04/26/15 1738    Education provided Yes   Education Details sEMG results and relation to current condition, PT vs Chiropractors    Person(s) Educated Patient   Methods Explanation   Comprehension Verbalized understanding          PT Short Term Goals - 04/16/15 1419    PT SHORT TERM GOAL #1   Title After 2 weeks pt will be independent in starter HEP to help improve mobility deficits and recurrent pain and paresthesia.    PT SHORT TERM GOAL #2   Title After 3 weeks patient will demonstrate improved soft tissue compliance in Left forearm to reduce episodes of numbness to fewer than 3/week.    PT SHORT TERM GOAL #3   Title After 3 weeks patient will report improved ability to modify sleep positions in order to reduce nightly neck pain to fewer than 3  nights per week.            PT Long Term Goals - 04/16/15 1422    PT LONG TERM GOAL #1   Title After 6 weeks pt will demonstrate an advanced HEP for DC centred around running specific core training that can be performed with neurtal cervical spine postures.    PT LONG TERM GOAL #2   Title After 6 weeks pt will demonstrate full ROM in L wrist supination with normal end feel to demonstrate relief of tight myofascial/muscular restrictions.    PT LONG TERM GOAL #3   Title After 6 weeks patient will report improved tolerance to all IADL, ADL, and leisure activities without exacerbation of neck pain.                Plan - 04/26/15 1738    Clinical Impression Statement Performed functional exercises and stretches today, also introduced first rib  mobilizations and performed soft tissue mobilization to L forearm today. Of note, patient reports that he has numbness mostly when he is engaging in activiteis that involve him reaching overhead, which indicates possible involvement of thoracici outlet vs cervical spine at this point. Did however also notice some incresaed knotting in general area of pronator today and perofrmed soft tissue mobilization which did per patient seem to alleviate syptoms somewhat. At this time continue to suspect that patient has multiple factors complicating the presentation of his symptoms.    Pt will benefit from skilled therapeutic intervention in order to improve on the following deficits Decreased activity tolerance;Pain;Hypomobility;Decreased range of motion   Rehab Potential Good   PT Frequency 3x / week   PT Duration 4 weeks   PT Treatment/Interventions Moist Heat;Therapeutic exercise;Therapeutic activities;Manual techniques;Patient/family education;Taping   PT Next Visit Plan Functional postural stretches and psotural strength focus; 1st rib mobs; manual to L forearm; strategic strengthening of L forearm    PT Home Exercise Plan Given pronator teres stretches; please review.    Consulted and Agree with Plan of Care Patient        Problem List Patient Active Problem List   Diagnosis Date Noted  . Tinea pedis of left foot 01/04/2014  . Osteoarthritis of foot, left 01/04/2014  . Stiffness of joint, lower leg 06/29/2013  . Strain of hamstring muscle 06/29/2013  . Right hip pain 06/22/2013  . Sciatic nerve pain 04/11/2011  . Gait abnormality 09/26/2010  . STRESS FRACTURE, FOOT 04/24/2009  . MONONEURITIS, LEG 03/27/2009    [name] PT, DPT [phone]  [facility] [address] [place], [place], [address] Phone: [phone]   Fax:  [phone]  Name: FREEDOM PEDDY MRN: [id number] Date of Birth: [birth year]

## 2015-05-03 ENCOUNTER — Ambulatory Visit (HOSPITAL_COMMUNITY): Payer: BC Managed Care – PPO | Admitting: Physical Therapy

## 2015-05-03 DIAGNOSIS — M25532 Pain in left wrist: Secondary | ICD-10-CM

## 2015-05-03 DIAGNOSIS — R200 Anesthesia of skin: Secondary | ICD-10-CM

## 2015-05-03 DIAGNOSIS — M542 Cervicalgia: Secondary | ICD-10-CM

## 2015-05-03 DIAGNOSIS — M25412 Effusion, left shoulder: Secondary | ICD-10-CM

## 2015-05-03 DIAGNOSIS — R202 Paresthesia of skin: Secondary | ICD-10-CM

## 2015-05-03 NOTE — Therapy (Signed)
[place] [facility] [address] [place], [place], [address] Phone: [phone]   Fax:  [phone]  Physical Therapy Treatment  Patient Details  Name: Clifford Harris MRN: [id number] Date of Birth: 04/04/1956 No Data Recorded  Encounter Date: 05/03/2015      Harris End of Session - 05/03/15 1149    Visit Number 6   Number of Visits 12   Date for Harris Re-Evaluation 05/15/15   Authorization Type BCBS   Authorization Time Period 04/14/15-06/15/15   Harris Start Time 1103   Harris Stop Time 1143   Harris Time Calculation (min) 40 min   Activity Tolerance Patient tolerated treatment well   Behavior During Therapy [facility] for tasks assessed/performed      Past Medical History  Diagnosis Date  . Neuropathy     No past surgical history on file.  There were no vitals filed for this visit.  Visit Diagnosis:  Pain in joint, forearm, left  Numbness and tingling in left upper extremity  Neck pain  Effusion of clavicular joint, left      Subjective Assessment - 05/03/15 1108    Subjective Patient doing well today, does report however that he had acute onset of vertigo immediately after last session and requests that first rib mobs be held today due to being concerned about their relation to vertigo    Pertinent History Harris is an avid runner and exerciser, now performing 2-3 hours of exercises daily, including 100 sit-ups and many push-ups as well. Harris performs the eliptical regularly for cross training, and spends very little time on the computer daily (~2 hours total).    Patient Stated Goals Resolve L arm paresthesia, improve neck pain.    Currently in Pain? No/denies  just numbness/tingling                          [facility] Adult Harris Treatment/Exercise - 05/03/15 0001    Neck Exercises: Seated   Neck Retraction 15 reps   Other Seated Exercise thoracic and cervical excursions 1x15; backwards shoulder rolls 1x15   Shoulder Exercises: Seated   Flexion 15 reps   Flexion Weight (lbs) 3   Abduction 15 reps   ABduction Weight (lbs) 2   Other Seated Exercises twisting with 2.5 pount weight    Shoulder Exercises: Standing   Other Standing Exercises supination/pronation with 2# dowel 1x10 each side    Manual Therapy   Manual Therapy Soft tissue mobilization   Soft tissue mobilization L forearm generally over area of pronator    Neck Exercises: Stretches   Corner Stretch 3 reps;30 seconds   Other Neck Stretches 90/90 stretch 2x30 seconds    Other Neck Stretches forearm flexor stretch 2x30 seconds                 Harris Education - 05/03/15 1149    Education provided No          Harris Short Term Goals - 04/16/15 1419    Harris SHORT TERM GOAL #1   Title After 2 weeks Harris will be independent in starter HEP to help improve mobility deficits and recurrent pain and paresthesia.    Harris SHORT TERM GOAL #2   Title After 3 weeks patient will demonstrate improved soft tissue compliance in Left forearm to reduce episodes of numbness to fewer than 3/week.    Harris SHORT TERM GOAL #3   Title After 3 weeks patient will report improved ability  to modify sleep positions in order to reduce nightly neck pain to fewer than 3 nights per week.            Harris Long Term Goals - 04/16/15 1422    Harris LONG TERM GOAL #1   Title After 6 weeks Harris will demonstrate an advanced HEP for DC centred around running specific core training that can be performed with neurtal cervical spine postures.    Harris LONG TERM GOAL #2   Title After 6 weeks Harris will demonstrate full ROM in L wrist supination with normal end feel to demonstrate relief of tight myofascial/muscular restrictions.    Harris LONG TERM GOAL #3   Title After 6 weeks patient will report improved tolerance to all IADL, ADL, and leisure activities without exacerbation of neck pain.                Plan - 05/03/15 1149    Clinical Impression Statement Contineud with postural exercises and stretches  today, including exercises for forearm flexors, extensors, supinators, and pronators in order to assist in addressing ongoing symptoms in forearm at this time. Patient reqeusted hold on 1st rib mobs as he had acute spell of vertigo after leaving therapy last session, possibly related to mobilizations- therapy sttaff to monitor. Finished session with  manual to general area of pronator at end of session, performed separately from exercise today.    Harris will benefit from skilled therapeutic intervention in order to improve on the following deficits Decreased activity tolerance;Pain;Hypomobility;Decreased range of motion   Rehab Potential Good   Harris Frequency 3x / week   Harris Duration 4 weeks   Harris Treatment/Interventions Moist Heat;Therapeutic exercise;Therapeutic activities;Manual techniques;Patient/family education;Taping   Harris Next Visit Plan Functional postural stretches and psotural strength focus; manual to L forearm; strategic strengthening of L forearm; hold first rib mobs due to possible relation to vertigo    Harris Home Exercise Plan Given pronator teres stretches; please review.    Consulted and Agree with Plan of Care Patient        Problem List Patient Active Problem List   Diagnosis Date Noted  . Tinea pedis of left foot 01/04/2014  . Osteoarthritis of foot, left 01/04/2014  . Stiffness of joint, lower leg 06/29/2013  . Strain of hamstring muscle 06/29/2013  . Right hip pain 06/22/2013  . Sciatic nerve pain 04/11/2011  . Gait abnormality 09/26/2010  . STRESS FRACTURE, FOOT 04/24/2009  . MONONEURITIS, LEG 03/27/2009    Clifford Harris, DPT [phone]  [facility] [address] [place], [place], [address] Phone: [phone]   Fax:  [phone]  Name: Clifford Harris MRN: [id number] Date of Birth: [birth year]

## 2015-05-09 ENCOUNTER — Ambulatory Visit (HOSPITAL_COMMUNITY): Payer: BC Managed Care – PPO | Attending: Family Medicine | Admitting: Physical Therapy

## 2015-05-09 DIAGNOSIS — R200 Anesthesia of skin: Secondary | ICD-10-CM | POA: Diagnosis present

## 2015-05-09 DIAGNOSIS — M542 Cervicalgia: Secondary | ICD-10-CM | POA: Diagnosis present

## 2015-05-09 DIAGNOSIS — R202 Paresthesia of skin: Secondary | ICD-10-CM

## 2015-05-09 DIAGNOSIS — M25412 Effusion, left shoulder: Secondary | ICD-10-CM | POA: Diagnosis present

## 2015-05-09 DIAGNOSIS — M25532 Pain in left wrist: Secondary | ICD-10-CM | POA: Diagnosis present

## 2015-05-09 NOTE — Therapy (Signed)
[facility] [address] [place], [place], [address] Phone: [phone]   Fax:  [phone]  Physical Therapy Treatment  Patient Details  Name: Clifford Harris MRN: [id number] Date of Birth: 04/18/1956 No Data Recorded  Encounter Date: 05/09/2015      PT End of Session - 05/09/15 1635    Visit Number 7   Number of Visits 12   Date for PT Re-Evaluation 05/15/15   Authorization Type BCBS   Authorization Time Period 04/14/15-06/15/15   PT Start Time 1520   PT Stop Time 1558   PT Time Calculation (min) 38 min   Activity Tolerance Patient tolerated treatment well   Behavior During Therapy [facility] for tasks assessed/performed      Past Medical History  Diagnosis Date  . Neuropathy     No past surgical history on file.  There were no vitals filed for this visit.  Visit Diagnosis:  Pain in joint, forearm, left  Numbness and tingling in left upper extremity  Neck pain  Effusion of clavicular joint, left      Subjective Assessment - 05/09/15 1520    Subjective Patient reports he is doing well today, did not have vertigo after last session but is having some pain today especially in his shoulders    Pertinent History Pt is an avid runner and exerciser, now performing 2-3 hours of exercises daily, including 100 sit-ups and many push-ups as well. Pt performs the eliptical regularly for cross training, and spends very little time on the computer daily (~2 hours total).    Patient Stated Goals Resolve L arm paresthesia, improve neck pain.    Currently in Pain? Yes   Pain Score 4    Pain Location Shoulder   Pain Orientation Right;Left                         [facility] Adult PT Treatment/Exercise - 05/09/15 0001    Neck Exercises: Seated   Neck Retraction 15 reps   Other Seated Exercise thoracic and cervical excursions 1x15; backwards shoulder rolls 1x15   Shoulder Exercises: Seated   Other Seated Exercises  supination/pronation 1x10 2#; punching with 2# and pronation twist    Other Seated Exercises twisting with 2.5 pount weight x5    Manual Therapy   Manual Therapy Soft tissue mobilization   Manual therapy comments performed after all other functional exercises today    Soft tissue mobilization L forearm generally over area of pronator    Neck Exercises: Stretches   Corner Stretch 3 reps;30 seconds   Other Neck Stretches 90/90 stretch 2x30 seconds                 PT Education - 05/09/15 1635    Education provided No          PT Short Term Goals - 04/16/15 1419    PT SHORT TERM GOAL #1   Title After 2 weeks pt will be independent in starter HEP to help improve mobility deficits and recurrent pain and paresthesia.    PT SHORT TERM GOAL #2   Title After 3 weeks patient will demonstrate improved soft tissue compliance in Left forearm to reduce episodes of numbness to fewer than 3/week.    PT SHORT TERM GOAL #3   Title After 3 weeks patient will report improved ability to modify sleep positions in order to reduce nightly neck pain to fewer than 3 nights per week.  PT Long Term Goals - 04/16/15 1422    PT LONG TERM GOAL #1   Title After 6 weeks pt will demonstrate an advanced HEP for DC centred around running specific core training that can be performed with neurtal cervical spine postures.    PT LONG TERM GOAL #2   Title After 6 weeks pt will demonstrate full ROM in L wrist supination with normal end feel to demonstrate relief of tight myofascial/muscular restrictions.    PT LONG TERM GOAL #3   Title After 6 weeks patient will report improved tolerance to all IADL, ADL, and leisure activities without exacerbation of neck pain.                Plan - 05/09/15 1636    Clinical Impression Statement Continued with postural exercises and stretches today again including exercises with focus on forearm supinators/pronators. Did not perform first rib mobilzations  today due to concerns exacerbating vertigo. Finished session with manual to forearm over pronator area after all otehr ther ex had been completed. Patient not noticing large difference in symptom presentation today at end of session. Unable to accurately or definiitvely describe activities that exacerbate symptoms.    Pt will benefit from skilled therapeutic intervention in order to improve on the following deficits Decreased activity tolerance;Pain;Hypomobility;Decreased range of motion   Rehab Potential Good   PT Frequency 3x / week   PT Duration 4 weeks   PT Treatment/Interventions Moist Heat;Therapeutic exercise;Therapeutic activities;Manual techniques;Patient/family education;Taping   PT Next Visit Plan Functional postural stretches and psotural strength focus; manual to L forearm; strategic strengthening of L forearm; hold first rib mobs due to possible relation to vertigo    PT Home Exercise Plan Given pronator teres stretches; please review.    Consulted and Agree with Plan of Care Patient        Problem List Patient Active Problem List   Diagnosis Date Noted  . Tinea pedis of left foot 01/04/2014  . Osteoarthritis of foot, left 01/04/2014  . Stiffness of joint, lower leg 06/29/2013  . Strain of hamstring muscle 06/29/2013  . Right hip pain 06/22/2013  . Sciatic nerve pain 04/11/2011  . Gait abnormality 09/26/2010  . STRESS FRACTURE, FOOT 04/24/2009  . MONONEURITIS, LEG 03/27/2009    [name] PT, DPT [id number]  [facility] [address] [place], [place], [address] Phone: [phone]   Fax:  [phone]  Name: Clifford Harris MRN: [id number] Date of Birth: 24-Feb-1956

## 2015-05-10 ENCOUNTER — Ambulatory Visit (HOSPITAL_COMMUNITY): Payer: BC Managed Care – PPO | Admitting: Physical Therapy

## 2015-05-10 DIAGNOSIS — R200 Anesthesia of skin: Secondary | ICD-10-CM

## 2015-05-10 DIAGNOSIS — M25532 Pain in left wrist: Secondary | ICD-10-CM | POA: Diagnosis not present

## 2015-05-10 DIAGNOSIS — R202 Paresthesia of skin: Secondary | ICD-10-CM

## 2015-05-10 DIAGNOSIS — M542 Cervicalgia: Secondary | ICD-10-CM

## 2015-05-10 DIAGNOSIS — M25412 Effusion, left shoulder: Secondary | ICD-10-CM

## 2015-05-10 NOTE — Therapy (Signed)
[facility] [address] [place], [place], [address] Phone: [phone]   Fax:  [phone]  Physical Therapy Treatment  Patient Details  Name: Clifford Harris MRN: [id number] Date of Birth: 06/19/1955 No Data Recorded  Encounter Date: 05/10/2015      PT End of Session - 05/10/15 1601    Visit Number 8   Number of Visits 12   Date for PT Re-Evaluation 05/15/15   Authorization Type BCBS   PT Start Time 1519   PT Stop Time 1558   PT Time Calculation (min) 39 min   Activity Tolerance Patient tolerated treatment well   Behavior During Therapy [facility] for tasks assessed/performed      Past Medical History  Diagnosis Date  . Neuropathy     No past surgical history on file.  There were no vitals filed for this visit.  Visit Diagnosis:  Pain in joint, forearm, left  Numbness and tingling in left upper extremity  Neck pain  Effusion of clavicular joint, left      Subjective Assessment - 05/10/15 1521    Subjective Patient reports he woke up with a "stitch" in his back this morning, otherwise doing well    Pertinent History Pt is an avid runner and exerciser, now performing 2-3 hours of exercises daily, including 100 sit-ups and many push-ups as well. Pt performs the eliptical regularly for cross training, and spends very little time on the computer daily (~2 hours total).    Patient Stated Goals Resolve L arm paresthesia, improve neck pain.    Currently in Pain? Yes   Pain Score 5    Pain Location Back                         [facility] Adult PT Treatment/Exercise - 05/10/15 0001    Neck Exercises: Seated   Neck Retraction 20 reps   Other Seated Exercise thoracic and cervical excursions 1x15; backwards shoulder rolls 1x15   Other Seated Exercise backwards shoulder rolls 1x15    Shoulder Exercises: Seated   Other Seated Exercises twisting with 2.5 pount weight x5    Shoulder Exercises: Standing   Extension 20 reps    Theraband Level (Shoulder Extension) Level 4 (Blue)   Retraction 20 reps   Theraband Level (Shoulder Retraction) Level 4 (Blue)   Other Standing Exercises downward diagonals with blue TB 1x15 each    Manual Therapy   Manual therapy comments performed separately from functional exercises    Joint Mobilization grade 2-3 PAs to thoracic and cervical spines    Neck Exercises: Stretches   Corner Stretch 3 reps;30 seconds   Other Neck Stretches 90/90 stretch 2x30 seconds                 PT Education - 05/10/15 1601    Education provided Yes   Education Details regular treat next session, re-eval monday    Person(s) Educated Patient   Methods Explanation   Comprehension Verbalized understanding          PT Short Term Goals - 04/16/15 1419    PT SHORT TERM GOAL #1   Title After 2 weeks pt will be independent in starter HEP to help improve mobility deficits and recurrent pain and paresthesia.    PT SHORT TERM GOAL #2   Title After 3 weeks patient will demonstrate improved soft tissue compliance in Left forearm to reduce episodes of numbness to fewer than 3/week.  PT SHORT TERM GOAL #3   Title After 3 weeks patient will report improved ability to modify sleep positions in order to reduce nightly neck pain to fewer than 3 nights per week.            PT Long Term Goals - 04/16/15 1422    PT LONG TERM GOAL #1   Title After 6 weeks pt will demonstrate an advanced HEP for DC centred around running specific core training that can be performed with neurtal cervical spine postures.    PT LONG TERM GOAL #2   Title After 6 weeks pt will demonstrate full ROM in L wrist supination with normal end feel to demonstrate relief of tight myofascial/muscular restrictions.    PT LONG TERM GOAL #3   Title After 6 weeks patient will report improved tolerance to all IADL, ADL, and leisure activities without exacerbation of neck pain.                Plan - 05/10/15 1604    Clinical  Impression Statement Congtinued with functional stretches and exercises today, introduced some new band exercises and also introduced PAs to thoracic and cervical spine in order to improve overall mobilty, posture, and potentially decrease numbness in L UE. No major change in symptoms at end of session. Next session to be antoher treatment; following will be re-assess with potential DC if no significant progress made.    Pt will benefit from skilled therapeutic intervention in order to improve on the following deficits Decreased activity tolerance;Pain;Hypomobility;Decreased range of motion   Rehab Potential Good   PT Frequency 3x / week   PT Duration 4 weeks   PT Treatment/Interventions Moist Heat;Therapeutic exercise;Therapeutic activities;Manual techniques;Patient/family education;Taping   PT Next Visit Plan Functional postural stretches and psotural strength focus; manual to L forearm; strategic strengthening of L forearm; hold first rib mobs due to possible relation to vertigo; arms on fire exercsie    PT Home Exercise Plan Given pronator teres stretches; please review.    Consulted and Agree with Plan of Care Patient        Problem List Patient Active Problem List   Diagnosis Date Noted  . Tinea pedis of left foot 01/04/2014  . Osteoarthritis of foot, left 01/04/2014  . Stiffness of joint, lower leg 06/29/2013  . Strain of hamstring muscle 06/29/2013  . Right hip pain 06/22/2013  . Sciatic nerve pain 04/11/2011  . Gait abnormality 09/26/2010  . STRESS FRACTURE, FOOT 04/24/2009  . MONONEURITIS, LEG 03/27/2009    [name] PT, DPT [phone]  [facility] [address] [place], [place], [address] Phone: [phone]   Fax:  [phone]  Name: Clifford Harris MRN: [id number] Date of Birth: 1955/06/03

## 2015-05-11 ENCOUNTER — Ambulatory Visit (HOSPITAL_COMMUNITY): Payer: BC Managed Care – PPO | Admitting: Physical Therapy

## 2015-05-11 ENCOUNTER — Encounter (HOSPITAL_COMMUNITY): Payer: BC Managed Care – PPO | Admitting: Physical Therapy

## 2015-05-11 DIAGNOSIS — M25532 Pain in left wrist: Secondary | ICD-10-CM | POA: Diagnosis not present

## 2015-05-11 DIAGNOSIS — R200 Anesthesia of skin: Secondary | ICD-10-CM

## 2015-05-11 DIAGNOSIS — M542 Cervicalgia: Secondary | ICD-10-CM

## 2015-05-11 DIAGNOSIS — M25412 Effusion, left shoulder: Secondary | ICD-10-CM

## 2015-05-11 DIAGNOSIS — R202 Paresthesia of skin: Secondary | ICD-10-CM

## 2015-05-11 NOTE — Therapy (Signed)
[facility] [address] [place], [place], [address] Phone: [phone]   Fax:  [phone]  Physical Therapy Treatment  Patient Details  Name: Clifford Harris MRN: [id number] Date of Birth: Jun 06, 1955 No Data Recorded  Encounter Date: 05/11/2015      PT End of Session - 05/11/15 1747    Visit Number 9   Number of Visits 12   Date for PT Re-Evaluation 05/15/15   Authorization Type BCBS   Authorization Time Period 04/14/15-06/15/15   PT Start Time 1526   PT Stop Time 1600   PT Time Calculation (min) 34 min   Activity Tolerance Patient tolerated treatment well   Behavior During Therapy [facility] for tasks assessed/performed      Past Medical History  Diagnosis Date  . Neuropathy     No past surgical history on file.  There were no vitals filed for this visit.  Visit Diagnosis:  Pain in joint, forearm, left  Numbness and tingling in left upper extremity  Neck pain  Effusion of clavicular joint, left      Subjective Assessment - 05/11/15 1527    Subjective patient reports he is doing well today; no changes in condition after introduction of new treatments last session    Pertinent History Pt is an avid runner and exerciser, now performing 2-3 hours of exercises daily, including 100 sit-ups and many push-ups as well. Pt performs the eliptical regularly for cross training, and spends very little time on the computer daily (~2 hours total).    Patient Stated Goals Resolve L arm paresthesia, improve neck pain.    Currently in Pain? No/denies                         [facility] Adult PT Treatment/Exercise - 05/11/15 0001    Neck Exercises: Seated   Neck Retraction 20 reps   Other Seated Exercise thoracic and cervical excursions 1x15; backwards shoulder rolls 1x15   Shoulder Exercises: Standing   Other Standing Exercises arms on fire exercise forward and backwards 60 seconds each    Other Standing Exercises 3# bicep curls  1x15, supination/pronation 3x15; wrist flexion 3x15    Manual Therapy   Manual Therapy Soft tissue mobilization   Manual therapy comments performed separately from functional exercises    Soft tissue mobilization L forearm generally over area of pronator and down into flexors    Neck Exercises: Stretches   Corner Stretch 3 reps;30 seconds   Other Neck Stretches 90/90 stretch 2x30 seconds                 PT Education - 05/11/15 1747    Education provided Yes   Education Details re-assess Monday with possible adjustment to plan of care    Person(s) Educated Patient   Methods Explanation   Comprehension Verbalized understanding          PT Short Term Goals - 04/16/15 1419    PT SHORT TERM GOAL #1   Title After 2 weeks pt will be independent in starter HEP to help improve mobility deficits and recurrent pain and paresthesia.    PT SHORT TERM GOAL #2   Title After 3 weeks patient will demonstrate improved soft tissue compliance in Left forearm to reduce episodes of numbness to fewer than 3/week.    PT SHORT TERM GOAL #3   Title After 3 weeks patient will report improved ability to modify sleep positions in order to reduce nightly  neck pain to fewer than 3 nights per week.            PT Long Term Goals - 04/16/15 1422    PT LONG TERM GOAL #1   Title After 6 weeks pt will demonstrate an advanced HEP for DC centred around running specific core training that can be performed with neurtal cervical spine postures.    PT LONG TERM GOAL #2   Title After 6 weeks pt will demonstrate full ROM in L wrist supination with normal end feel to demonstrate relief of tight myofascial/muscular restrictions.    PT LONG TERM GOAL #3   Title After 6 weeks patient will report improved tolerance to all IADL, ADL, and leisure activities without exacerbation of neck pain.                Plan - 05/11/15 1748    Clinical Impression Statement Continued with functional stretches and  exercises today, with increased focus on shoulder and wrist; patient did state feels of increased tightness wrist flexors after exercises so performed soft tissue work separately from exercises today to this area. Patient did express some concern about vascular involvement but was educated that symptoms primarily, from PT [place], likely appear to be primarily neurological due to negative ROOS and pattern of exacerbation of numbness.    Pt will benefit from skilled therapeutic intervention in order to improve on the following deficits Decreased activity tolerance;Pain;Hypomobility;Decreased range of motion   Rehab Potential Good   PT Frequency 3x / week   PT Duration 4 weeks   PT Treatment/Interventions Moist Heat;Therapeutic exercise;Therapeutic activities;Manual techniques;Patient/family education;Taping   PT Next Visit Plan Re-assess with potential DC    PT Home Exercise Plan Given pronator teres stretches; please review.    Consulted and Agree with Plan of Care Patient        Problem List Patient Active Problem List   Diagnosis Date Noted  . Tinea pedis of left foot 01/04/2014  . Osteoarthritis of foot, left 01/04/2014  . Stiffness of joint, lower leg 06/29/2013  . Strain of hamstring muscle 06/29/2013  . Right hip pain 06/22/2013  . Sciatic nerve pain 04/11/2011  . Gait abnormality 09/26/2010  . STRESS FRACTURE, FOOT 04/24/2009  . MONONEURITIS, LEG 03/27/2009    Nedra HaiKristen Amante Fomby PT, DPT [phone]  [facility] [address] [place], [place], [address] Phone: [phone]   Fax:  [phone]  Name: Clifford Harris MRN: [id number] Date of Birth: 07/27/1955

## 2015-05-15 ENCOUNTER — Ambulatory Visit (HOSPITAL_COMMUNITY): Payer: BC Managed Care – PPO | Admitting: Physical Therapy

## 2015-05-15 DIAGNOSIS — M25532 Pain in left wrist: Secondary | ICD-10-CM | POA: Diagnosis not present

## 2015-05-15 DIAGNOSIS — R200 Anesthesia of skin: Secondary | ICD-10-CM

## 2015-05-15 DIAGNOSIS — M542 Cervicalgia: Secondary | ICD-10-CM

## 2015-05-15 DIAGNOSIS — M25412 Effusion, left shoulder: Secondary | ICD-10-CM

## 2015-05-15 DIAGNOSIS — R202 Paresthesia of skin: Secondary | ICD-10-CM

## 2015-05-15 NOTE — Therapy (Signed)
[facility] [address] [place], [place], [address] Phone: [phone]   Fax:  [phone]  Physical Therapy Treatment (Re-Assessment)  Patient Details  Name: Clifford Harris MRN: [id number] Date of Birth: 1955-08-20 No Data Recorded  Encounter Date: 05/15/2015      PT End of Session - 05/15/15 1201    Visit Number 10   Number of Visits 11   Date for PT Re-Evaluation 06/12/15   Authorization Type BCBS   Authorization Time Period 04/14/15-06/15/15   PT Start Time 1110   PT Stop Time 1150   PT Time Calculation (min) 40 min   Activity Tolerance Patient tolerated treatment well   Behavior During Therapy [facility] for tasks assessed/performed      Past Medical History  Diagnosis Date  . Neuropathy     No past surgical history on file.  There were no vitals filed for this visit.  Visit Diagnosis:  Pain in joint, forearm, left  Numbness and tingling in left upper extremity  Neck pain  Effusion of clavicular joint, left      Subjective Assessment - 05/15/15 1111    Subjective Patient reports that despite all the approaches PT has been trying, he has not noticed any changes in his condition and even noticed some numbness/tingling in R UE now. He is walking up in middle of the night with his L arm numb now. Neck is a little sore this morning.    Pertinent History Pt is an avid runner and exerciser, now performing 2-3 hours of exercises daily, including 100 sit-ups and many push-ups as well. Pt performs the eliptical regularly for cross training, and spends very little time on the computer daily (~2 hours total).    Patient Stated Goals Resolve L arm paresthesia, improve neck pain.    Currently in Pain? No/denies  neck is a little sore but not major and no other major pains             [facility] PT Assessment - 05/15/15 0001    Observation/Other Assessments   Observations muscle tightness L ulnar-sided flexors and proximal pronator;  examined pushup/exercise form and noted poor neck control    Posture/Postural Control   Posture Comments B IR shoulders, some kyphosis, forward head   AROM   Right Elbow Extension 7  lacking range    Left Elbow Extension 8  lacking range    Right Forearm Pronation --  WFL    Right Forearm Supination --  some functional tightness but WFL    Left Forearm Pronation --  WFL    Left Forearm Supination --  some end range tightness but [facility]    Cervical Flexion 43   Cervical Extension 60   Cervical - Right Side Bend 47   Cervical - Left Side Bend 40   Cervical - Right Rotation 60   Cervical - Left Rotation 65   Strength   Right Shoulder Flexion 4+/5   Right Shoulder ABduction 4+/5   Right Shoulder Internal Rotation 4+/5   Right Shoulder External Rotation 4+/5   Left Shoulder Flexion 4+/5   Left Shoulder ABduction 4+/5   Left Shoulder Internal Rotation 4+/5   Left Shoulder External Rotation 4+/5   Right Elbow Flexion 4+/5   Right Elbow Extension 4+/5   Left Elbow Flexion 4+/5   Left Elbow Extension 4+/5   Right Forearm Pronation 4+/5   Right Forearm Supination 4+/5   Left Forearm Pronation 4+/5   Left Forearm Supination  4+/5   Cervical Flexion 4/5   Cervical Extension 4+/5   Cervical - Right Side Bend 4+/5   Cervical - Left Side Bend 4/5                     [facility] Adult PT Treatment/Exercise - 05/15/15 0001    Manual Therapy   Manual Therapy Soft tissue mobilization   Manual therapy comments performed separately from functional exercises    Soft tissue mobilization L forearm generally over area of pronator and down into flexors                 PT Education - 05/15/15 1200    Education provided Yes   Education Details progress so far with skilled PT services, plan of care moving forward    Person(s) Educated Patient   Methods Explanation   Comprehension Verbalized understanding          PT Short Term Goals - 05/15/15 1127    PT SHORT TERM GOAL  #1   Title After 2 weeks pt will be independent in starter HEP to help improve mobility deficits and recurrent pain and paresthesia.    Baseline 1/9- going fair    Period Weeks   Status On-going   PT SHORT TERM GOAL #2   Title After 3 weeks patient will demonstrate improved soft tissue compliance in Left forearm to reduce episodes of numbness to fewer than 3/week.    Baseline 1/9- no major impact, still having numbness several time a day    Period Weeks   Status On-going   PT SHORT TERM GOAL #3   Title After 3 weeks patient will report improved ability to modify sleep positions in order to reduce nightly neck pain to fewer than 3 nights per week.    Baseline 1/9- neck pain is generally not as bad   Period Weeks   Status Achieved           PT Long Term Goals - 05/15/15 1129    PT LONG TERM GOAL #2   Title After 6 weeks pt will demonstrate full ROM in L wrist supination with normal end feel to demonstrate relief of tight myofascial/muscular restrictions.    Baseline 1/9- range appears [facility] however continues to have tight endfeel    Period Weeks   Status Partially Met   PT LONG TERM GOAL #3   Title After 6 weeks patient will report improved tolerance to all IADL, ADL, and leisure activities without exacerbation of neck pain.    Baseline 1/9- generally in day time does not have neck pain    Period Weeks   Status Achieved               Plan - 05/15/15 1201    Clinical Impression Statement Re-assessment performed today. Patient does not show significatn improvement with his neck or with parathetic symptoms in L forearm at this time, and reports that he is every starting to have some episodes of numbness in his R UE starting at the elbow and travelling down the entirety of his forearm with no specific dermatomal or nerve based items to allow PT to narrow down etiology of his symptoms. Patient does continue to demonstate postural impairment with reduced functional movement of  thoracic and cervical spines and general shoulder area, also demonstrate poor fnctional posture and posititioning of neck during exercises such as pushups. Patient is not making singificant improvement with skilled PT services, and at this time recommend one last  session to focus on dynamic posture during exercise; also encouraged patient to pursue nerve conduction velocity test with MD.    Pt will benefit from skilled therapeutic intervention in order to improve on the following deficits Decreased activity tolerance;Pain;Hypomobility;Decreased range of motion   Rehab Potential Good   PT Frequency Other (comment)  1 last session    PT Duration Other (comment)  1 last session    PT Treatment/Interventions Moist Heat;Therapeutic exercise;Therapeutic activities;Manual techniques;Patient/family education;Taping   PT Next Visit Plan Focus on fuctional posture of neck during exercise, DC    PT Home Exercise Plan Given pronator teres stretches; please review.    Consulted and Agree with Plan of Care Patient        Problem List Patient Active Problem List   Diagnosis Date Noted  . Tinea pedis of left foot 01/04/2014  . Osteoarthritis of foot, left 01/04/2014  . Stiffness of joint, lower leg 06/29/2013  . Strain of hamstring muscle 06/29/2013  . Right hip pain 06/22/2013  . Sciatic nerve pain 04/11/2011  . Gait abnormality 09/26/2010  . STRESS FRACTURE, FOOT 04/24/2009  . MONONEURITIS, LEG 03/27/2009   Physical Therapy Progress Note  Dates of Reporting Period: 04/14/15 to 05/15/15  Objective Reports of Subjective Statement: see above   Objective Measurements: see above   Goal Update: see above   Plan: see above; encouraged patient to ask MD about Nerve conduction velocity   Reason Skilled Services are Required: one last session for focus on neck posture/control during exercise, advanced HEP, [place] PT, DPT [place], [place], [address] Phone: [phone]   Fax:  [phone]  Name: Clifford Harris MRN: [id number] Date of Birth: 1955/06/30

## 2015-05-17 ENCOUNTER — Ambulatory Visit (HOSPITAL_COMMUNITY): Payer: BC Managed Care – PPO | Admitting: Physical Therapy

## 2015-05-17 DIAGNOSIS — R202 Paresthesia of skin: Secondary | ICD-10-CM

## 2015-05-17 DIAGNOSIS — M25412 Effusion, left shoulder: Secondary | ICD-10-CM

## 2015-05-17 DIAGNOSIS — R200 Anesthesia of skin: Secondary | ICD-10-CM

## 2015-05-17 DIAGNOSIS — M542 Cervicalgia: Secondary | ICD-10-CM

## 2015-05-17 DIAGNOSIS — M25532 Pain in left wrist: Secondary | ICD-10-CM | POA: Diagnosis not present

## 2015-05-17 NOTE — Patient Instructions (Signed)
   SCAPULAR PROTRACTION - SUPINE - FREE WEIGHT - SERRATUS PUNCHES   SCAPULAR PROTRACTION - SUPINE - FREE WEIGHT - SERRATUS PUNCHES  Lie on your back holding a small free weight or soup can with your arm extended out in front of your body and towards the ceiling. While keeping your elbows straight, protract your shoulders forward towards the ceiling. Keep your elbows straight the entire time.  Repeat 10 times initially without weight, 2x each side; repeat twice a day and progress weight as you are able with good form.     CORNER STRETCH  While standing at a corner of a wall, place your arms on the walls with elobws bent so that your upper arms are horizontal and your forearms are directed upwards as shown. Take one step forward towards the corner. Bend your front knee until a stretch is felt along the front of your chest and/or shoulders. Your arms should be pointed downward towards the ground.  NOTE: Your legs should control the stretch by bending or straightening your front knee.  Hold for 30 seconds and repeat 3 times, twice a day.    Lie on your back holding a small free weight or soup can with your arm extended out in front of your body and towards the ceiling. While keeping your elbows straight, protract your shoulders forward towards the ceiling. Keep your elbows straight the entire time.  You can progress this exercise to wall pushups (keeping your chin tucked and keeping the muscles around your shoulder blade muscles active), then to push-ups at an incline off of a table, then to full standard form pushups.   REMEMBER: 1) Keep your core active and remember to keep neutral posture when you are running; when you are running, also try to be more ginger with your steps instead of letting your feet slam down.  2) With sit-ups, make sure you are not curling your back and try to keep good posture in your trunk while performing the exercise. Consider reaching out to personal trainer for  expansion of core program.

## 2015-05-17 NOTE — Therapy (Signed)
[facility] [address] [place], [place], [address] Phone: [phone]   Fax:  [phone]  Physical Therapy Treatment  Patient Details  Name: Clifford Harris MRN: [id number] Date of Birth: 09-16-1955 No Data Recorded  Encounter Date: 05/17/2015      PT End of Session - 05/17/15 1156    Visit Number 11   Number of Visits 11   Date for PT Re-Evaluation 06/12/15   Authorization Type [employer]   Authorization Time Period 04/14/15-06/15/15   PT Start Time 1102   PT Stop Time 1148   PT Time Calculation (min) 46 min   Activity Tolerance Patient tolerated treatment well   Behavior During Therapy [facility] for tasks assessed/performed      Past Medical History  Diagnosis Date  . Neuropathy     No past surgical history on file.  There were no vitals filed for this visit.  Visit Diagnosis:  Pain in joint, forearm, left  Numbness and tingling in left upper extremity  Neck pain  Effusion of clavicular joint, left                       [facility] Adult PT Treatment/Exercise - 05/17/15 0001    Neck Exercises: Seated   Other Seated Exercise thoracic and cervical excursions 1 set x 15 reps   Other Seated Exercise corrected sit up form today; also introduced serratus anterior punches    Neck Exercises: Prone   Other Prone Exercise checked and corrected pushup form    Knee/Hip Exercises: Aerobic   Other Aerobic checked and corrected form on elliptical and during running                 PT Education - 05/17/15 1155    Education provided Yes   Education Details extensive education regarding exercise form including sit-ups, pushups, and elliptical/running; advanced HEP; continued to encouraged patient to pursue NCV test    Person(s) Educated Patient   Methods Explanation;Demonstration;Handout   Comprehension Verbalized understanding          PT Short Term Goals - 05/15/15 1127    PT SHORT TERM GOAL #1   Title After 2  weeks pt will be independent in starter HEP to help improve mobility deficits and recurrent pain and paresthesia.    Baseline 1/9- going fair    Period Weeks   Status On-going   PT SHORT TERM GOAL #2   Title After 3 weeks patient will demonstrate improved soft tissue compliance in Left forearm to reduce episodes of numbness to fewer than 3/week.    Baseline 1/9- no major impact, still having numbness several time a day    Period Weeks   Status On-going   PT SHORT TERM GOAL #3   Title After 3 weeks patient will report improved ability to modify sleep positions in order to reduce nightly neck pain to fewer than 3 nights per week.    Baseline 1/9- neck pain is generally not as bad   Period Weeks   Status Achieved           PT Long Term Goals - 05/15/15 1129    PT LONG TERM GOAL #2   Title After 6 weeks pt will demonstrate full ROM in L wrist supination with normal end feel to demonstrate relief of tight myofascial/muscular restrictions.    Baseline 1/9- range appears [facility] however continues to have tight endfeel    Period Weeks   Status Partially Met  PT LONG TERM GOAL #3   Title After 6 weeks patient will report improved tolerance to all IADL, ADL, and leisure activities without exacerbation of neck pain.    Baseline 1/9- generally in day time does not have neck pain    Period Weeks   Status Achieved               Plan - 05/17/15 1159    Clinical Impression Statement Focused on form during functional exercises today including pushups, sit-ups, elliptical, and running; advised patient regarding specific form corrections for each exercise including posture, core activation, specific HEP exercises for strategic muscle strengthening to assist in form, and general efficiency of movement tips. Patient does show some scapular winging with pushups and was corrected/given exercise to address. Also encouraged patient to seek consult from trainer for updates to personal exercise program  especially core program updates. Patient discharged today due to general lack of progress with skilled PT services.     Pt will benefit from skilled therapeutic intervention in order to improve on the following deficits Decreased activity tolerance;Pain;Hypomobility;Decreased range of motion   Rehab Potential Good   PT Next Visit Plan DC today    PT Home Exercise Plan advanced HEP    Consulted and Agree with Plan of Care Patient        Problem List Patient Active Problem List   Diagnosis Date Noted  . Tinea pedis of left foot 01/04/2014  . Osteoarthritis of foot, left 01/04/2014  . Stiffness of joint, lower leg 06/29/2013  . Strain of hamstring muscle 06/29/2013  . Right hip pain 06/22/2013  . Sciatic nerve pain 04/11/2011  . Gait abnormality 09/26/2010  . STRESS FRACTURE, FOOT 04/24/2009  . MONONEURITIS, LEG 03/27/2009    PHYSICAL THERAPY DISCHARGE SUMMARY  Visits from Start of Care: 11  Current functional level related to goals / functional outcomes: Continues to experience numbness and tingling in his L UE and reports that this is slowly spreading to his R UE; no changes in symptoms in UE or neck as skilled PT services have progressed    Remaining deficits: Numbness, poor posture, neck   Education / Equipment: Advanced HEP, encouraged to see personal trainer for consult on expanding core exercise, form correction for exercise  Plan: Patient agrees to discharge.  Patient goals were partially met. Patient is being discharged due to lack of progress.  ?????       Deniece Ree PT, DPT [facility] [address] [place], [place], [address] Phone: [phone]   Fax:  [phone]  Name: Clifford Harris MRN: [id number] Date of Birth: [birth year]

## 2015-05-19 ENCOUNTER — Ambulatory Visit (HOSPITAL_COMMUNITY): Payer: BC Managed Care – PPO

## 2015-09-18 ENCOUNTER — Ambulatory Visit (INDEPENDENT_AMBULATORY_CARE_PROVIDER_SITE_OTHER): Payer: BC Managed Care – PPO

## 2015-09-18 ENCOUNTER — Ambulatory Visit (INDEPENDENT_AMBULATORY_CARE_PROVIDER_SITE_OTHER): Payer: BC Managed Care – PPO | Admitting: Orthopedic Surgery

## 2015-09-18 VITALS — BP 141/70 | HR 59 | Ht 70.0 in | Wt 155.0 lb

## 2015-09-18 DIAGNOSIS — M25561 Pain in right knee: Secondary | ICD-10-CM | POA: Diagnosis not present

## 2015-09-18 DIAGNOSIS — S83241A Other tear of medial meniscus, current injury, right knee, initial encounter: Secondary | ICD-10-CM

## 2015-09-18 NOTE — Progress Notes (Signed)
Chief Complaint  Patient presents with  . Knee Pain    Right knee pain and swelling    HPI 60 year old male right knee pain and swelling for one month or more after doing some yard work. Constant 5-6 out of 10 aching pain right knee with increased symptoms with standing partial relief with topical diclofenac sodium gel  So we've been through 6 weeks of diclofenac gel still having trouble with medial knee pain and inability to kneel or squat  Review of Systems  Eyes: Negative for blurred vision.  Respiratory: Negative for cough.   Cardiovascular: Negative for chest pain.  Gastrointestinal: Negative for heartburn.  Genitourinary: Negative for dysuria.  Musculoskeletal: Negative for myalgias.  Skin: Positive for itching and rash.  Neurological: Negative for dizziness and headaches.  Endo/Heme/Allergies: Negative for environmental allergies and polydipsia. Does not bruise/bleed easily.  Psychiatric/Behavioral: Negative for depression.    Denies fever chills back pain and gait disturbance numbness tingling or weakness  Past Medical History  Diagnosis Date  . Neuropathy    Surgery he has not had any surgery he reported  Family History  Problem Relation Age of Onset  . Sudden death Neg Hx   . Hypertension Neg Hx   . Hyperlipidemia Neg Hx   . Diabetes Neg Hx   . Heart attack Neg Hx    Social History  Substance Use Topics  . Smoking status: Never Smoker   . Smokeless tobacco: Never Used  . Alcohol Use: Not on file    Current outpatient prescriptions:  .  diclofenac sodium (VOLTAREN) 1 % GEL, Apply 2 g topically 4 (four) times daily. To affected joint., Disp: 300 g, Rfl: 11 .  gabapentin (NEURONTIN) 300 MG capsule, TAKE ONE CAPSULE BY MOUTH AT BEDTIME, Disp: 90 capsule, Rfl: 3 .  traMADol (ULTRAM) 50 MG tablet, Take 2 tab TID prn pain, Disp: 180 tablet, Rfl: 0  BP 141/70 mmHg  Pulse 59  Ht 5\' 10"  (1.778 m)  Wt 155 lb (70.308 kg)  BMI 22.24 kg/m2  Physical Exam   Constitutional: He is oriented to person, place, and time. He appears well-developed and well-nourished. No distress.  Cardiovascular: Normal rate and intact distal pulses.   Musculoskeletal:  Right and left arm and upper extremity inspection palpation normal no effusions no tenderness. Full range of motion without contracture subluxation atrophy tremor or skin rash. Deficit or sensory abnormality  Lymph nodes nonpalpable cervical axillary and epitrochlear  Neurological: He is alert and oriented to person, place, and time.  Skin: Skin is warm and dry. No rash noted. He is not diaphoretic. No erythema. No pallor.  Psychiatric: He has a normal mood and affect. His behavior is normal. Judgment and thought content normal.    Ortho Exam Left knee no swelling no tenderness range of motion is normal. Ligaments are stable. Strength normal. Skin normal. Pulses normal. No edema distally. Sensation normal.  Right knee skin has some changes secondary to poison oak is clearing up. Strength is normal. Ligaments are stable. Range of motion is limited to 90 flexion and pain passively I can flex and a 1:30 and then severe pain he has a positive McMurray's medial joint line tenderness small effusion pulses are good temperature is normal no peripheral edema sensation is intact to soft touch and pressure as well as pain.  ASSESSMENT: My personal interpretation of the images:  Plain films were normal   Medial meniscal tear right knee PLAN Recommend MRI scan and surgery on the right knee  partial meniscectomy arthroscopic.

## 2015-09-18 NOTE — Patient Instructions (Addendum)
MRI right knee  Perform gentle range of motion exercises right knee by bending and straightening the knee 15-203-4 times a day to maintain flexibility / prevent stiffness. Use ice if you see swelling apply ice packs 30 minutes 2-3 times per day  Avoid any climbing kneeling bending or excessive squatting    Meniscus Tear A meniscus tear is a knee injury in which a piece of the meniscus is torn. The meniscus is a thick, rubbery, wedge-shaped cartilage in the knee. Two menisci are located in each knee. They sit between the upper bone (femur) and lower bone (tibia) that make up the knee joint. Each meniscus acts as a shock absorber for the knee. A torn meniscus is one of the most common types of knee injuries. This injury can range from mild to severe. Surgery may be needed for a severe tear. CAUSES This injury may be caused by any squatting, twisting, or pivoting movement. Sports-related injuries are the most common cause. These often occur from:  Running and stopping suddenly.  Changing direction.  Being tackled or knocked off your feet. As people get older, their meniscus gets thinner and weaker. In these people, tears can happen more easily, such as from climbing stairs.  RISK FACTORS This injury is more likely to happen to:  People who play contact sports.  Males.  People who are 8130-61 years of age. SYMPTOMS  Symptoms of this injury include:  Knee pain, especially at the side of the knee joint. You may feel pain when the injury occurs, or you may only hear a pop and feel pain later.  A feeling that your knee is clicking, catching, locking, or giving way.  Not being able to fully bend or extend your knee.  Bruising or swelling in your knee. DIAGNOSIS  This injury may be diagnosed based on your symptoms and a physical exam. The physical exam may include:  Moving your knee in different ways.  Feeling for tenderness.  Listening for a clicking sound.  Checking if your knee  locks or catches. You may also have tests, such as:  X-rays.  MRI.  A procedure to look inside your knee with a narrow surgical telescope (arthroscopy). You may be referred to a knee specialist (orthopedic surgeon). TREATMENT  Treatment for this injury depends on the severity of the tear. Treatment for a mild tear may include:  Rest.  Medicine to reduce pain and swelling. This is usually a nonsteroidal anti-inflammatory drug (NSAID).  A knee brace or an elastic sleeve or wrap.  Using crutches or a walker to keep weight off your knee and to help you walk.  Exercises to strengthen your knee (physical therapy). You may need surgery if you have a severe tear or if other treatments are not working.  HOME CARE INSTRUCTIONS Managing Pain and Swelling  Take over-the-counter and prescription medicines only as told by your health care provider.  If directed, apply ice to the injured area:  Put ice in a plastic bag.  Place a towel between your skin and the bag.  Leave the ice on for 20 minutes, 2-3 times per day.  Raise (elevate) the injured area above the level of your heart while you are sitting or lying down. Activity  Do not use the injured limb to support your body weight until your health care provider says that you can. Use crutches or a walker as told by your health care provider.  Return to your normal activities as told by your health care  provider. Ask your health care provider what activities are safe for you.  Perform range-of-motion exercises only as told by your health care provider.  Begin doing exercises to strengthen your knee and leg muscles only as told by your health care provider. After you recover, your health care provider may recommend these exercises to help prevent another injury. General Instructions  Use a knee brace or elastic wrap as told by your health care provider.  Keep all follow-up visits as told by your health care provider. This is  important. SEEK MEDICAL CARE IF:  You have a fever.  Your knee becomes red, tender, or swollen.  Your pain medicine is not helping.  Your symptoms get worse or do not improve after 2 weeks of home care.   This information is not intended to replace advice given to you by your health care provider. Make sure you discuss any questions you have with your health care provider.   Document Released: 07/13/2002 Document Revised: 01/11/2015 Document Reviewed: 08/15/2014 Elsevier Interactive Patient Education [employer]

## 2015-09-19 ENCOUNTER — Telehealth: Payer: Self-pay | Admitting: Orthopedic Surgery

## 2015-09-19 NOTE — Telephone Encounter (Signed)
Patient called and said he decided he doesn't want the MRI done right now.  He would like for a nurse to call him.

## 2015-09-19 NOTE — Telephone Encounter (Signed)
Patient states he is undecided about MRI, he will call and let us know what he wants to do

## 2015-09-24 ENCOUNTER — Other Ambulatory Visit: Payer: BC Managed Care – PPO

## 2015-12-07 ENCOUNTER — Other Ambulatory Visit: Payer: Self-pay | Admitting: Sports Medicine

## 2015-12-07 DIAGNOSIS — M19172 Post-traumatic osteoarthritis, left ankle and foot: Secondary | ICD-10-CM

## 2015-12-08 ENCOUNTER — Other Ambulatory Visit: Payer: Self-pay | Admitting: *Deleted

## 2015-12-08 DIAGNOSIS — M19172 Post-traumatic osteoarthritis, left ankle and foot: Secondary | ICD-10-CM

## 2015-12-08 MED ORDER — DICLOFENAC SODIUM 1 % TD GEL: 2.0000 g | Freq: Four times a day (QID) | TRANSDERMAL | 2 refills | Status: DC

## 2016-05-14 ENCOUNTER — Ambulatory Visit: Payer: BC Managed Care – PPO | Admitting: Sports Medicine

## 2016-07-11 ENCOUNTER — Other Ambulatory Visit (HOSPITAL_COMMUNITY): Payer: Self-pay | Admitting: Family Medicine

## 2016-07-11 ENCOUNTER — Ambulatory Visit (HOSPITAL_COMMUNITY)
Admission: RE | Admit: 2016-07-11 | Discharge: 2016-07-11 | Disposition: A | Discharge status: Home / Self Care | Payer: BC Managed Care – PPO | Source: Ambulatory Visit | Attending: Family Medicine | Admitting: Family Medicine

## 2016-07-11 DIAGNOSIS — M7712 Lateral epicondylitis, left elbow: Secondary | ICD-10-CM

## 2016-07-11 DIAGNOSIS — G894 Chronic pain syndrome: Secondary | ICD-10-CM | POA: Insufficient documentation

## 2016-08-04 ENCOUNTER — Emergency Department (HOSPITAL_COMMUNITY)
Admission: EM | Admit: 2016-08-04 | Discharge: 2016-08-04 | Disposition: A | Discharge status: Home / Self Care | Payer: BC Managed Care – PPO | Attending: Emergency Medicine | Admitting: Emergency Medicine

## 2016-08-04 ENCOUNTER — Encounter (HOSPITAL_COMMUNITY): Payer: Self-pay | Admitting: *Deleted

## 2016-08-04 DIAGNOSIS — M5412 Radiculopathy, cervical region: Secondary | ICD-10-CM | POA: Insufficient documentation

## 2016-08-04 DIAGNOSIS — M542 Cervicalgia: Secondary | ICD-10-CM | POA: Diagnosis present

## 2016-08-04 MED ORDER — CYCLOBENZAPRINE HCL 10 MG PO TABS
5.0000 mg | ORAL_TABLET | Freq: Once | ORAL | Status: AC
  Administered 2016-08-04: 5 mg via ORAL
  Filled 2016-08-04: qty 1

## 2016-08-04 MED ORDER — PREDNISONE 20 MG PO TABS: ORAL_TABLET | ORAL | 0 refills | Status: DC

## 2016-08-04 MED ORDER — CYCLOBENZAPRINE HCL 10 MG PO TABS
5.0000 mg | ORAL_TABLET | Freq: Three times a day (TID) | ORAL | 0 refills | Status: DC
Start: 2016-08-04 — End: 2017-08-20

## 2016-08-04 MED ORDER — PREDNISONE 50 MG PO TABS
60.0000 mg | ORAL_TABLET | Freq: Once | ORAL | Status: AC
  Administered 2016-08-04: 60 mg via ORAL
  Filled 2016-08-04: qty 1

## 2016-08-04 NOTE — ED Notes (Signed)
ED Provider at bedside. 

## 2016-08-04 NOTE — ED Triage Notes (Signed)
Pt reports neck pain/cramping tha radiates into his shoulders since Wednesday. Pt states he can feel his heart beating in his neck. Pt unable to move his neck side to side.

## 2016-08-04 NOTE — ED Provider Notes (Signed)
[facility]-EMERGENCY DEPT Provider Note   CSN: [id number] Arrival date & time: 08/04/16  1856     History   Chief Complaint Chief Complaint  Patient presents with  . Neck Pain    Hpi Clifford Harris is a 61 y.o. male. Complains of posterior neck pain radiating into both shoulders onset 4 days ago. Pain is worse with turning his neck from side to side or with flexing or extending his neck. He is treated himself with tramadol and gabapentin which she takes for neuropathy, without relief. He denies any fever. Denies headache. Denies chest pain denies shortness of breath. He does report that sometimes he feels his heart beating in his posterior neck. No other associated symptoms. patient has suffered from neck pain for several years and had MRI performed 08/05/2014 showing mild disc bulging and osteophytic ridging at C6-7 and mild for tramadol narrowing at C3-4. Other associated symptoms include occasional numbness in the fifth finger of right hand. No weakness in extremities. No fever. Pain is somewhat improved with remaining still  HPI  Past Medical History:  Diagnosis Date  . Neuropathy [facility])     Patient Active Problem List   Diagnosis Date Noted  . Tinea pedis of left foot 01/04/2014  . Osteoarthritis of foot, left 01/04/2014  . Stiffness of joint, lower leg 06/29/2013  . Strain of hamstring muscle 06/29/2013  . Right hip pain 06/22/2013  . Sciatic nerve pain 04/11/2011  . Gait abnormality 09/26/2010  . STRESS FRACTURE, FOOT 04/24/2009  . MONONEURITIS, LEG 03/27/2009    History reviewed. No pertinent surgical history.     Home Medications    Prior to Admission medications   Medication Sig Start Date End Date Taking? Authorizing Provider  diclofenac sodium (VOLTAREN) 1 % GEL Apply 2 g topically 4 (four) times daily. 12/08/15   Enid Baas, MD  gabapentin (NEURONTIN) 300 MG capsule TAKE ONE CAPSULE BY MOUTH AT BEDTIME 01/30/15   Enid Baas, MD  traMADol Janean Sark) 50 MG tablet  Take 2 tab TID prn pain 06/01/14   Enid Baas, MD    Family History Family History  Problem Relation Age of Onset  . Sudden death Neg Hx   . Hypertension Neg Hx   . Hyperlipidemia Neg Hx   . Diabetes Neg Hx   . Heart attack Neg Hx     Social History Social History  Substance Use Topics  . Smoking status: Never Smoker  . Smokeless tobacco: Never Used  . Alcohol use Not on file     Allergies   Patient has no known allergies.   Review of Systems Review of Systems  Musculoskeletal: Positive for myalgias, neck pain and neck stiffness.       Chronic  Neurological: Positive for numbness.       Numbness in right fifth finger     Physical Exam Updated Vital Signs BP (!) 163/77 (BP Location: Right Arm)   Pulse 63   Temp 99.9 F (37.7 C) (Oral)   Resp 17   Ht  (1.778 m)   Wt 155 lb (70.3 kg)   SpO2 99%   BMI 22.24 kg/m   Physical Exam  Constitutional: He is oriented to person, place, and time. He appears well-developed and well-nourished. He appears distressed.  He is mildly uncomfortable Glasgow Coma Score 15  HENT:  Head: Normocephalic and atraumatic.  Right Ear: External ear normal.  Left Ear: External ear normal.  Mouth/Throat: Oropharynx is clear and moist.  Eyes: Conjunctivae are normal.  Pupils are equal, round, and reactive to light.  Neck: No tracheal deviation present. No thyromegaly present.  Pain on rotation to neck of either side and pain on flexion  Cardiovascular: Normal rate and regular rhythm.   No murmur heard. Pulmonary/Chest: Effort normal and breath sounds normal.  Abdominal: Soft. Bowel sounds are normal. He exhibits no distension. There is no tenderness.  Musculoskeletal: Normal range of motion. He exhibits no edema or tenderness.  Motor strength 5 over 5 overall  Neurological: He is alert and oriented to person, place, and time. Coordination normal.  DTRs symmetric bilaterally at knee jerk ankle jerk and biceps toes or going  bilaterally  Skin: Skin is warm and dry. No rash noted.  Psychiatric: He has a normal mood and affect.  Nursing note and vitals reviewed.    ED Treatments / Results  Labs (all labs ordered are listed, but only abnormal results are displayed) Labs Reviewed - No data to display  EKG  EKG Interpretation None       Radiology No results found.  Procedures Procedures (including critical care time)  Medications Ordered in ED Medications  predniSONE (DELTASONE) tablet 60 mg (60 mg Oral Given 08/04/16 2041)  cyclobenzaprine (FLEXERIL) tablet 5 mg (5 mg Oral Given 08/04/16 2041)     Initial Impression / Assessment and Plan / ED Course  I have reviewed the triage vital signs and the nursing notes.  Pertinent labs & imaging results that were available during my care of the patient were reviewed by me and considered in my medical decision making (see chart for details).     I feel that patient needs repeat MRI. He has a follow-up appointment with his orthopedist in 3 days to evaluate his neck. We will or MRI as outpatient to be obtained tomorrow. Prescriptions Flexeril, prednisone. He can continue his other medications with the exception of Advil which she is told not to take in combination with prednisone Final Clinical Impressions(s) / ED Diagnoses  Diagnosis cervical radiculopathy Final diagnoses:  None    New Prescriptions New Prescriptions   No medications on file     Doug Sou, MD 08/04/16 2052

## 2016-08-04 NOTE — Discharge Instructions (Signed)
Don't take Advil together with the prednisone prescribed, as the combination can cause ulcers. Come tomorrow at the instructed time to come for your MRI scan of your neck (cervical spine). Ask for a copy of the study so that you can show it to your orthopedic doctor your appointment in 3 days. It is Safe to take the tramadol and gabapentin along with the medicine prescribed. Get your blood pressure rechecked in a week. Today's was elevated at 171/81.

## 2016-08-06 ENCOUNTER — Ambulatory Visit (HOSPITAL_COMMUNITY)
Admission: RE | Admit: 2016-08-06 | Discharge: 2016-08-06 | Disposition: A | Discharge status: Home / Self Care | Payer: BC Managed Care – PPO | Source: Ambulatory Visit | Attending: Emergency Medicine | Admitting: Emergency Medicine

## 2016-08-06 DIAGNOSIS — M50223 Other cervical disc displacement at C6-C7 level: Secondary | ICD-10-CM | POA: Diagnosis not present

## 2016-08-06 DIAGNOSIS — M5412 Radiculopathy, cervical region: Secondary | ICD-10-CM | POA: Insufficient documentation

## 2016-08-06 NOTE — Progress Notes (Signed)
Cardiology Office Note   Date:  08/08/2016   ID:  WEYMAN BOGDON, DOB Jul 24, 1955, MRN [id number]  PCP:  Colette Ribas, MD  Cardiologist:   Seen by Antoine Poche in 2006   No chief complaint on file.     History of Present Illness: Clifford Harris is a 61 y.o. male who presents for evaluation/consultation for mitral valve prolapse. Referred by Dr Phillips Odor Review of primary Office note from 07/11/16 only indicates some palpitations and feeling heart beat in neck Evaluated in 2006 Running marathon and had chest pain And presyncope. Cath with no CAD ? Unusual morphology to ventricul in single RAO view Personally reviewed this and f/u cardiac MRI that I had done  MRI 04/24/2005 EF 69% septum 11-12 mm thickened apex no definitive HOCM or non compaction ? Mild bileaflet prolapse RV normal no gadolinium uptake   Labs reviewed:  Hct 41.2 Cr 1.1 Normal LFT;s LDL 101 TC 218 HDL 99 PSA .5  TSH 3.2 A1c 5.7   He does not have any chest pain. Still has an easy propensity to faint  Has run over 30 marathons but running Less lately   Has chronically abnormal ECG with diffuse T wave inversions inferior and anterolateral    Past Medical History:  Diagnosis Date  . Neuropathy [facility])     Past Surgical History:  Procedure Laterality Date  . TONSILLECTOMY AND ADENOIDECTOMY    . wisdom surgery     wisdom teeth     Current Outpatient Prescriptions  Medication Sig Dispense Refill  . cyclobenzaprine (FLEXERIL) 10 MG tablet Take 0.5 tablets (5 mg total) by mouth 3 (three) times daily. 6 tablet 0  . diclofenac sodium (VOLTAREN) 1 % GEL Apply 2 g topically 4 (four) times daily. 200 g 2  . gabapentin (NEURONTIN) 300 MG capsule TAKE ONE CAPSULE BY MOUTH AT BEDTIME 90 capsule 3  . predniSONE (DELTASONE) 20 MG tablet 2 tabs po daily x 4 days 8 tablet 0  . traMADol (ULTRAM) 50 MG tablet Take 2 tab TID prn pain 180 tablet 0   No current facility-administered medications for this visit.      Allergies:   Patient has no known allergies.    Social History:  The patient  reports that he has never smoked. He has never used smokeless tobacco.   Family History:  The patient's family history includes Alzheimer's disease in his father.    ROS:  Please see the history of present illness.   Otherwise, review of systems are positive for none.   All other systems are reviewed and negative.    PHYSICAL EXAM: VS:  BP 136/70 (BP Location: Right Arm)   Pulse 68   Ht  (1.778 m)   Wt 167 lb (75.8 kg)   SpO2 96%   BMI 23.96 kg/m  , BMI Body mass index is 23.96 kg/m. Affect appropriate Healthy:  appears stated age HEENT: normal Neck supple with no adenopathy JVP normal no bruits no thyromegaly Lungs clear with no wheezing and good diaphragmatic motion Heart:  S1/S2 no murmur, no rub, gallop or click PMI normal Abdomen: benighn, BS positve, no tenderness, no AAA no bruit.  No HSM or HJR Distal pulses intact with no bruits No edema Neuro non-focal Skin warm and dry No muscular weakness    EKG:  07/11/16 SR marked inferior lateral T wave inversions no old one to compare   Recent Labs: No results found for requested labs within last 8760 hours.  Lipid Panel No results found for: CHOL, TRIG, HDL, CHOLHDL, VLDL, LDLCALC, LDLDIRECT    Wt Readings from Last 3 Encounters:  08/08/16 167 lb (75.8 kg)  08/04/16 155 lb (70.3 kg)  09/18/15 155 lb (70.3 kg)      Other studies Reviewed: Additional studies/ records that were reviewed today include: March office notes Dr Phillips Odor ECG and labs . Cardiac MRI and cath from 2006     ASSESSMENT AND PLAN:  1.  MVP no real murmur on exam f/u echo no need for SBE 2. Abnormal ECG need f/u MRI to assess for HOCM and abnormal myocardium ECG was abnormal prior to cath And he had no CAD 3. Palpitations benign observe may need further evaluation if echo or MRI show structural heart disease    Current medicines are reviewed  at length with the patient today.  The patient does not have concerns regarding medicines.  The following changes have been made:  no change  Labs/ tests ordered today include: Cardiac MRI BMET and Echo   Orders Placed This Encounter  Procedures  . MR Card Morphology Wo/W Cm  . Basic Metabolic Panel (BMET)  . ECHOCARDIOGRAM COMPLETE     Disposition:   FU with me in a year      Signed, Charlton Haws, MD  08/08/2016 3:34 PM    [facility] [address] [place], [place], [place]  [address] Phone: [phone]; Fax: [phone]

## 2016-08-08 ENCOUNTER — Encounter: Payer: Self-pay | Admitting: Cardiovascular Disease

## 2016-08-08 ENCOUNTER — Ambulatory Visit (INDEPENDENT_AMBULATORY_CARE_PROVIDER_SITE_OTHER): Payer: BC Managed Care – PPO | Admitting: Cardiovascular Disease

## 2016-08-08 ENCOUNTER — Other Ambulatory Visit (HOSPITAL_COMMUNITY)
Admission: RE | Admit: 2016-08-08 | Discharge: 2016-08-08 | Disposition: A | Discharge status: Home / Self Care | Payer: BC Managed Care – PPO | Source: Ambulatory Visit | Attending: Cardiovascular Disease | Admitting: Cardiovascular Disease

## 2016-08-08 VITALS — BP 136/70 | HR 68 | Ht 70.0 in | Wt 167.0 lb

## 2016-08-08 DIAGNOSIS — R9431 Abnormal electrocardiogram [ECG] [EKG]: Secondary | ICD-10-CM

## 2016-08-08 DIAGNOSIS — Z01818 Encounter for other preprocedural examination: Secondary | ICD-10-CM | POA: Diagnosis not present

## 2016-08-08 DIAGNOSIS — I341 Nonrheumatic mitral (valve) prolapse: Secondary | ICD-10-CM

## 2016-08-08 DIAGNOSIS — R011 Cardiac murmur, unspecified: Secondary | ICD-10-CM | POA: Diagnosis not present

## 2016-08-08 DIAGNOSIS — I408 Other acute myocarditis: Secondary | ICD-10-CM

## 2016-08-08 LAB — BASIC METABOLIC PANEL
ANION GAP: 10 (ref 5–15)
BUN: 29 mg/dL — ABNORMAL HIGH (ref 6–20)
CALCIUM: 9.6 mg/dL (ref 8.9–10.3)
CO2: 29 mmol/L (ref 22–32)
Chloride: 97 mmol/L — ABNORMAL LOW (ref 101–111)
Creatinine, Ser: 1.2 mg/dL (ref 0.61–1.24)
Glucose, Bld: 153 mg/dL — ABNORMAL HIGH (ref 65–99)
POTASSIUM: 4.4 mmol/L (ref 3.5–5.1)
Sodium: 136 mmol/L (ref 135–145)

## 2016-08-08 NOTE — Patient Instructions (Signed)
Your physician wants you to follow-up in: 1 year Dr Haywood Filler will receive a reminder letter in the mail two months in advance. If you don't receive a letter, please call our office to schedule the follow-up appointment.   Your physician has requested that you have an echocardiogram. Echocardiography is a painless test that uses sound waves to create images of your heart. It provides your doctor with information about the size and shape of your heart and how well your heart's chambers and valves are working. This procedure takes approximately one hour. There are no restrictions for this procedure.    Your physician has requested that you have a cardiac MRI. Cardiac MRI uses a computer to create images of your heart as its beating, producing both still and moving pictures of your heart and major blood vessels. For further information please visit [email or web address]. Please follow the instruction sheet given to you today for more information.      Get blood work today:BMET        Thank you for choosing [facility] !

## 2016-08-13 ENCOUNTER — Telehealth: Payer: Self-pay | Admitting: Cardiovascular Disease

## 2016-08-13 ENCOUNTER — Encounter: Payer: Self-pay | Admitting: Cardiovascular Disease

## 2016-08-13 NOTE — Telephone Encounter (Signed)
Called and left a message for the patient concerning his appointment for his cardiac MRI.   Left my name and number if questions.

## 2016-08-13 NOTE — Telephone Encounter (Signed)
Called patient and left voicemail regarding cardiac MRI.  Told him to call me back.

## 2016-08-20 ENCOUNTER — Ambulatory Visit (HOSPITAL_COMMUNITY)
Admission: RE | Admit: 2016-08-20 | Discharge: 2016-08-20 | Disposition: A | Discharge status: Home / Self Care | Payer: BC Managed Care – PPO | Source: Ambulatory Visit | Attending: Cardiovascular Disease | Admitting: Cardiovascular Disease

## 2016-08-20 DIAGNOSIS — R011 Cardiac murmur, unspecified: Secondary | ICD-10-CM | POA: Diagnosis not present

## 2016-08-20 DIAGNOSIS — I341 Nonrheumatic mitral (valve) prolapse: Secondary | ICD-10-CM

## 2016-08-20 DIAGNOSIS — I081 Rheumatic disorders of both mitral and tricuspid valves: Secondary | ICD-10-CM | POA: Diagnosis not present

## 2016-08-20 NOTE — Progress Notes (Signed)
*  PRELIMINARY RESULTS* Echocardiogram 2D Echocardiogram has been performed.  Clifford Harris 08/20/2016, 10:55 AM

## 2016-08-21 ENCOUNTER — Ambulatory Visit (HOSPITAL_COMMUNITY)
Admission: RE | Admit: 2016-08-21 | Discharge: 2016-08-21 | Disposition: A | Discharge status: Home / Self Care | Payer: BC Managed Care – PPO | Source: Ambulatory Visit | Attending: Cardiovascular Disease | Admitting: Cardiovascular Disease

## 2016-08-21 DIAGNOSIS — I408 Other acute myocarditis: Secondary | ICD-10-CM | POA: Diagnosis present

## 2016-08-21 DIAGNOSIS — R9431 Abnormal electrocardiogram [ECG] [EKG]: Secondary | ICD-10-CM | POA: Diagnosis not present

## 2016-08-21 DIAGNOSIS — I089 Rheumatic multiple valve disease, unspecified: Secondary | ICD-10-CM | POA: Diagnosis not present

## 2016-08-21 DIAGNOSIS — I421 Obstructive hypertrophic cardiomyopathy: Secondary | ICD-10-CM | POA: Diagnosis not present

## 2016-08-21 MED ORDER — GADOBENATE DIMEGLUMINE 529 MG/ML IV SOLN
30.0000 mL | Freq: Once | INTRAVENOUS | Status: AC
  Administered 2016-08-21: 30 mL via INTRAVENOUS

## 2017-07-20 NOTE — Progress Notes (Signed)
Cardiology Office Note   Date:  07/23/2017   ID:  Clifford Harris, DOB 07/27/1955, MRN [id number]  PCP:  Assunta FoundGolding, John, MD  Cardiologist:   Seen by Clifford Harris in 2006   No chief complaint on file.     History of Present Illness: 62 y.o. first seen 08/21/16 for MVP, abnormal ECG and palpitations Normal cath 2006 after having chest pain running marathon. Unusual ventricular morphology with f/u MRI suggesting mild bi leaflet prolapse some thickening of apex but no gad uptake or true HOCM  Chronically abnormal ECG with diffuse T wave inversions Faints easily Has run over 30 marathons   MRI 08/21/16:  EF 66% septum 12 mm no SAM with likely apical hypertrophic DCM spade morphology apical hyperenhancement   Echo: 08/20/16 mild bi leaflet prolapse trivial MR no mention apical hypertrophy  He has not had syncope in years. He does note 2x/year he has sudden increase in HR while running with palpitations but no lightheadedness I discussed the diagnosis of apical hypertrophic DCM. And possible need for further w/u risk VT/NSVT He is not keen on idea of AICD but is willing to see EP  Active camping in [place], has two 63 yo goldens's and is retired   Past Medical History:  Diagnosis Date  . Neuropathy     Past Surgical History:  Procedure Laterality Date  . TONSILLECTOMY AND ADENOIDECTOMY    . wisdom surgery     wisdom teeth     Current Outpatient Medications  Medication Sig Dispense Refill  . cyclobenzaprine (FLEXERIL) 10 MG tablet Take 0.5 tablets (5 mg total) by mouth 3 (three) times daily. 6 tablet 0  . diclofenac sodium (VOLTAREN) 1 % GEL Apply 2 g topically 4 (four) times daily. 200 g 2  . gabapentin (NEURONTIN) 300 MG capsule TAKE ONE CAPSULE BY MOUTH AT BEDTIME 90 capsule 3  . traMADol (ULTRAM) 50 MG tablet Take 2 tab TID prn pain 180 tablet 0   No current facility-administered medications for this visit.     Allergies:   Patient has no known allergies.    Social History:   The patient  reports that  has never smoked. he has never used smokeless tobacco. He reports that he drinks about 8.4 oz of alcohol per week. He reports that he does not use drugs.   Family History:  The patient's family history includes Alzheimer's disease in his father.    ROS:  Please see the history of present illness.   Otherwise, review of systems are positive for none.   All other systems are reviewed and negative.    PHYSICAL EXAM: VS:  BP 124/76   Pulse (!) 57   Ht 5\' 10"  (1.778 m)   Wt 170 lb 6.4 oz (77.3 kg)   SpO2 95%   BMI 24.45 kg/m  , BMI Body mass index is 24.45 kg/m. Affect appropriate Healthy:  appears stated age HEENT: normal Neck supple with no adenopathy JVP normal no bruits no thyromegaly Lungs clear with no wheezing and good diaphragmatic motion Heart:  S1/S2 no murmur, no rub, gallop or click PMI normal Abdomen: benighn, BS positve, no tenderness, no AAA no bruit.  No HSM or HJR Distal pulses intact with no bruits No edema Neuro non-focal Skin warm and dry No muscular weakness     EKG:  07/11/16 SR marked inferior lateral T wave inversions no old one to compare   Recent Labs: 08/08/2016: BUN 29; Creatinine, Ser 1.20; Potassium 4.4; Sodium 136  Lipid Panel No results found for: CHOL, TRIG, HDL, CHOLHDL, VLDL, LDLCALC, LDLDIRECT    Wt Readings from Last 3 Encounters:  07/23/17 170 lb 6.4 oz (77.3 kg)  08/08/16 167 lb (75.8 kg)  08/04/16 155 lb (70.3 kg)      Other studies Reviewed: Additional studies/ records that were reviewed today include: March office notes Dr Phillips Odor ECG and labs . Cardiac MRI and cath from 2006     ASSESSMENT AND PLAN:  1. MVP: no murmur trivial MR by echo 08/20/16  2. Abnormal ECG likely from apical hypertrophic DCM given some rapid palpitations will have him see EP to see if we  Need to do any further testing to risk stratify for sudden death VT especially given gadolinium uptake on MRI 3. Palpitations not  frequent but see #2 above     Current medicines are reviewed at length with the patient today.  The patient does not have concerns regarding medicines.  The following changes have been made:  no change  Labs/ tests ordered today include: Cardiac MRI BMET and Echo   Orders Placed This Encounter  Procedures  . Ambulatory referral to Cardiac Electrophysiology  . EKG 12-Lead     Disposition:   FU with me in a year  F/u GT in [place]  for EP     Signed, Clifford Haws, MD  07/23/2017 10:36 AM    [facility] [address] [place], [place], [place]  [address] Phone: [phone]; Fax: [phone]

## 2017-07-23 ENCOUNTER — Ambulatory Visit: Payer: BC Managed Care – PPO | Admitting: Cardiovascular Disease

## 2017-07-23 ENCOUNTER — Encounter: Payer: Self-pay | Admitting: Cardiovascular Disease

## 2017-07-23 VITALS — BP 124/76 | HR 57 | Ht 70.0 in | Wt 170.4 lb

## 2017-07-23 DIAGNOSIS — R002 Palpitations: Secondary | ICD-10-CM | POA: Diagnosis not present

## 2017-07-23 DIAGNOSIS — I422 Other hypertrophic cardiomyopathy: Secondary | ICD-10-CM | POA: Diagnosis not present

## 2017-07-23 NOTE — Patient Instructions (Addendum)
Medication Instructions:  Your physician recommends that you continue on your current medications as directed. Please refer to the Current Medication list given to you today.   Labwork: NONE  Testing/Procedures: You have been referred to DR. Ladona RidgelAYLOR (CARDIAC ELECTROPHYSIOLOGY)    Follow-Up: Your physician recommends that you schedule a follow-up appointment in: DR. Ladona RidgelAYLOR   Your physician wants you to follow-up in: 1 YEAR DR. Eden EmmsNISHAN .  You will receive a reminder letter in the mail two months in advance. If you don't receive a letter, please call our office to schedule the follow-up appointment.   Any Other Special Instructions Will Be Listed Below (If Applicable).     If you need a refill on your cardiac medications before your next appointment, please call your pharmacy.

## 2017-08-20 ENCOUNTER — Ambulatory Visit: Payer: BC Managed Care – PPO | Admitting: Internal Medicine

## 2017-08-20 ENCOUNTER — Encounter: Payer: Self-pay | Admitting: Internal Medicine

## 2017-08-20 VITALS — BP 120/80 | HR 52 | Ht 70.0 in | Wt 167.0 lb

## 2017-08-20 DIAGNOSIS — I422 Other hypertrophic cardiomyopathy: Secondary | ICD-10-CM | POA: Diagnosis not present

## 2017-08-20 DIAGNOSIS — R002 Palpitations: Secondary | ICD-10-CM

## 2017-08-20 NOTE — Progress Notes (Signed)
HPI Mr. Clifford Harris returns today after a long absence from our arrhythmia clinic. I saw him over 10 years ago when he had experienced syncope. The patient has run over 30 marathons and he has had a MRI scan which revealed apical scarring although he was not certain to have an apical HCM. He did not have an aneurysm diagnosed. He has had a h/o tachy palpitations but these stop and start suddenly. No recent syncope. He denies chest pain or sob.  No Known Allergies   Current Outpatient Medications  Medication Sig Dispense Refill  . diclofenac sodium (VOLTAREN) 1 % GEL Apply 2 g topically 2 (two) times daily.    Marland Kitchen. escitalopram (LEXAPRO) 10 MG tablet Take 10 mg by mouth daily.     Marland Kitchen. gabapentin (NEURONTIN) 300 MG capsule Take 300 mg by mouth 4 (four) times daily.    . traMADol (ULTRAM) 50 MG tablet Take 100 mg by mouth 2 (two) times daily.     No current facility-administered medications for this visit.      Past Medical History:  Diagnosis Date  . Neuropathy     ROS:   All systems reviewed and negative except as noted in the HPI.   Past Surgical History:  Procedure Laterality Date  . TONSILLECTOMY AND ADENOIDECTOMY    . wisdom surgery     wisdom teeth     Family History  Problem Relation Age of Onset  . Alzheimer's disease Father   . Sudden death Neg Hx   . Hypertension Neg Hx   . Hyperlipidemia Neg Hx   . Diabetes Neg Hx   . Heart attack Neg Hx      Social History   Socioeconomic History  . Marital status: Married    Spouse name: Not on file  . Number of children: Not on file  . Years of education: Not on file  . Highest education level: Not on file  Occupational History  . Not on file  Social Needs  . Financial resource strain: Not on file  . Food insecurity:    Worry: Not on file    Inability: Not on file  . Transportation needs:    Medical: Not on file    Non-medical: Not on file  Tobacco Use  . Smoking status: Never Smoker  . Smokeless tobacco:  Never Used  Substance and Sexual Activity  . Alcohol use: Yes    Alcohol/week: 8.4 oz    Types: 14 Glasses of wine per week  . Drug use: No  . Sexual activity: Not on file  Lifestyle  . Physical activity:    Days per week: Not on file    Minutes per session: Not on file  . Stress: Not on file  Relationships  . Social connections:    Talks on phone: Not on file    Gets together: Not on file    Attends religious service: Not on file    Active member of club or organization: Not on file    Attends meetings of clubs or organizations: Not on file    Relationship status: Not on file  . Intimate partner violence:    Fear of current or ex partner: Not on file    Emotionally abused: Not on file    Physically abused: Not on file    Forced sexual activity: Not on file  Other Topics Concern  . Not on file  Social History Narrative  . Not on file  BP 120/80   Pulse (!) 52   Ht 5\' 10"  (1.778 m)   Wt 167 lb (75.8 kg)   BMI 23.96 kg/m   Physical Exam:  Well appearing 62 yo man, looks younger than his stated age, NAD HEENT: Unremarkable Neck:  6 cm JVD, no thyromegally Lymphatics:  No adenopathy Back:  No CVA tenderness Lungs:  Clear with no wheezes HEART:  Regular rate rhythm, no murmurs, no rubs, no clicks Abd:  soft, positive bowel sounds, no organomegally, no rebound, no guarding Ext:  2 plus pulses, no edema, no cyanosis, no clubbing Skin:  No rashes no nodules Neuro:  CN II through XII intact, motor grossly intact  EKG - NSR with diffuse T wave inversions   Assess/Plan: 1. Syncope - he has not had an episode recently but does have palpitations. I have recommended insertion of an ILR. 2. Possible HCM, apical variant. Reviewed Dr. Ricki Miller note. I will review his MRI and try to get a clear cut diagnosis. He is said to have apical scarring without aneurysm.  3. Palpitations - Unclear as to what the mechanism of his symptoms is. I have recommended ILR insertion vs  watchful waiting.   Leonia Reeves.D.

## 2017-08-20 NOTE — Patient Instructions (Addendum)
Medication Instructions:  Your physician recommends that you continue on your current medications as directed. Please refer to the Current Medication list given to you today.  Labwork: None ordered.  Testing/Procedures: Your physician is recommending you have a loop recorder implanted.  Follow-Up:  All procedure days: April 18, 22, 23, 24, and 30 May 1, 6, 8, 9, 22, 24, 28 and 30 If you decide on a day please give me a call:  Dierdre HighmanJenny RN [phone]   Any Other Special Instructions Will Be Listed Below (If Applicable).  If you need a refill on your cardiac medications before your next appointment, please call your pharmacy.    Implantable Loop Recorder Placement An implantable loop recorder is a small electronic device that is placed under the skin of your chest. It is about the size of an AA ("double A") battery. The device records the electrical activity of your heart over a long period of time. Your health care provider can download these recordings to monitor your heart. You may need an implantable loop recorder if you have periods of abnormal heart activity (arrhythmias) or unexplained fainting (syncope) caused by a heart problem. Tell a health care provider about:  Any allergies you have.  All medicines you are taking, including vitamins, herbs, eye drops, creams, and over-the-counter medicines.  Any problems you or family members have had with anesthetic medicines.  Any blood disorders you have.  Any surgeries you have had.  Any medical conditions you have.  Whether you are pregnant or may be pregnant. What are the risks? Generally, this is a safe procedure. However, as with any procedure, problems may occur, including:  Infection.  Bleeding.  Allergic reactions to anesthetic medicines.  Damage to nerves or blood vessels.  Failure of the device to work. This could require another surgery to replace it.  What happens before the procedure?   You may have a  physical exam, blood tests, and imaging tests of your heart, such as a chest X-ray.  Follow instructions from your health care provider about eating or drinking restrictions.  Ask your health care provider about: ? Changing or stopping your regular medicines. This is especially important if you are taking diabetes medicines or blood thinners. ? Taking medicines such as aspirin and ibuprofen. These medicines can thin your blood. Do not take these medicines before your procedure if your surgeon instructs you not to.  Ask your health care provider how your surgical site will be marked or identified.  You may be given antibiotic medicine to help prevent infection.  Plan to have someone take you home after the procedure.  If you will be going home right after the procedure, plan to have someone with you for 24 hours.  Do not use any tobacco products, such as cigarettes, chewing tobacco, and e-cigarettes as told by your surgeon. If you need help quitting, ask your health care provider. What happens during the procedure?  To reduce your risk of infection: ? Your health care team will wash or sanitize their hands. ? Your skin will be washed with soap.  An IV tube will be inserted into one of your veins.  You may be given an antibiotic medicine through the IV tube.  You may be given one or more of the following: ? A medicine to help you relax (sedative). ? A medicine to numb the area (local anesthetic).  A small cut (incision) will be made on the left side of your upper chest.  A pocket will  be created under your skin.  The device will be placed in the pocket.  The incision will be closed with stitches (sutures) or adhesive strips.  A bandage (dressing) will be placed over the incision. The procedure may vary among health care providers and hospitals. What happens after the procedure?  Your blood pressure, heart rate, breathing rate, and blood oxygen level will be monitored often  until the medicines you were given have worn off.  You may be able to go home on the day of your surgery. Before going home: ? Your health care provider will program your recorder. ? You will learn how to trigger your device with a handheld activator. ? You will learn how to send recordings to your health care provider. ? You will get an ID card for your device, and you will be told when to use it.  Do not drive for 24 hours if you received a sedative. This information is not intended to replace advice given to you by your health care provider. Make sure you discuss any questions you have with your health care provider. Document Released: 04/03/2015 Document Revised: 09/28/2015 Document Reviewed: 01/25/2015 Elsevier Interactive Patient Education  [employer].

## 2017-08-29 ENCOUNTER — Telehealth: Payer: Self-pay

## 2017-08-29 DIAGNOSIS — I421 Obstructive hypertrophic cardiomyopathy: Secondary | ICD-10-CM

## 2017-08-29 DIAGNOSIS — I341 Nonrheumatic mitral (valve) prolapse: Secondary | ICD-10-CM

## 2017-08-29 NOTE — Telephone Encounter (Addendum)
Order place from recall, needs f/u echo per Dr Eden EmmsNishan     Notes recorded by Wendall StadePeter C Nishan, MD on 08/22/2016 at 1:44 PM EDT EF normal some scarring tip of heart possible non obstructive hypertrophic DCM f/u MRI in a year    Order place for cardiac MRI

## 2017-08-29 NOTE — Addendum Note (Signed)
Addended by: Marlyn CorporalARLTON, Aloysius Heinle A on: 08/29/2017 11:50 AM   Modules accepted: Orders

## 2017-09-12 ENCOUNTER — Ambulatory Visit (HOSPITAL_COMMUNITY)
Admission: RE | Admit: 2017-09-12 | Discharge: 2017-09-12 | Disposition: A | Discharge status: Home / Self Care | Payer: BC Managed Care – PPO | Source: Ambulatory Visit | Attending: Cardiovascular Disease | Admitting: Cardiovascular Disease

## 2017-09-12 DIAGNOSIS — I341 Nonrheumatic mitral (valve) prolapse: Secondary | ICD-10-CM

## 2017-09-12 DIAGNOSIS — I081 Rheumatic disorders of both mitral and tricuspid valves: Secondary | ICD-10-CM | POA: Insufficient documentation

## 2017-09-12 NOTE — Progress Notes (Signed)
*  PRELIMINARY RESULTS* Echocardiogram 2D Echocardiogram has been performed.  Stacey Drain 09/12/2017, 12:42 PM

## 2017-09-30 ENCOUNTER — Telehealth: Payer: Self-pay

## 2017-09-30 NOTE — Telephone Encounter (Signed)
Left generic message requesting call back.  

## 2017-10-07 NOTE — Telephone Encounter (Signed)
Spoke with Pt wife per DPR.  Notified per Dr. Ladona Ridgelaylor- pt has apical HCM with no aneurysm.  Dr. Ladona Ridgelaylor advises meeting with him to discuss ILR in 4-6 weeks.    Wife will have Pt call office.  He will leave me a message to notify if he wants f/u appt vs no f/u.  Left this nurse name and #.

## 2018-05-14 ENCOUNTER — Encounter (INDEPENDENT_AMBULATORY_CARE_PROVIDER_SITE_OTHER): Payer: Self-pay | Admitting: *Deleted

## 2018-06-04 ENCOUNTER — Ambulatory Visit (INDEPENDENT_AMBULATORY_CARE_PROVIDER_SITE_OTHER): Payer: BC Managed Care – PPO | Admitting: Orthopedic Surgery

## 2018-06-04 ENCOUNTER — Ambulatory Visit (INDEPENDENT_AMBULATORY_CARE_PROVIDER_SITE_OTHER): Payer: Self-pay

## 2018-06-04 ENCOUNTER — Encounter (INDEPENDENT_AMBULATORY_CARE_PROVIDER_SITE_OTHER): Payer: Self-pay | Admitting: Orthopedic Surgery

## 2018-06-04 VITALS — Ht 70.0 in | Wt 167.0 lb

## 2018-06-04 DIAGNOSIS — M6702 Short Achilles tendon (acquired), left ankle: Secondary | ICD-10-CM | POA: Diagnosis not present

## 2018-06-04 DIAGNOSIS — M79672 Pain in left foot: Secondary | ICD-10-CM

## 2018-06-04 DIAGNOSIS — M205X2 Other deformities of toe(s) (acquired), left foot: Secondary | ICD-10-CM | POA: Diagnosis not present

## 2018-06-04 DIAGNOSIS — L97521 Non-pressure chronic ulcer of other part of left foot limited to breakdown of skin: Secondary | ICD-10-CM

## 2018-06-04 DIAGNOSIS — M79675 Pain in left toe(s): Secondary | ICD-10-CM

## 2018-06-04 NOTE — Progress Notes (Signed)
Office Visit Note   Patient: Clifford Harris           Date of Birth: March 04, 1956           MRN: [id number] Visit Date: 06/04/2018              Requested by: Assunta Found, MD [address] [place], [place] [address] PCP: Assunta Found, MD  Chief Complaint  Patient presents with  . Left Foot - Pain      HPI: Patient is a 63 year old gentleman who presents complaining of the great toe crossing over the second toe on the left foot.  He complains of pain primarily beneath the metatarsal heads 2 3 and 4 on the left foot.  Patient states he does a lot of running has run marathons.  He states he is tried Voltaren gel without relief.  He states that he has had PRP injections for the right knee he states he is on Neurontin but not sure what he takes it for.  Assessment & Plan: Visit Diagnoses:  1. Pain in left foot   2. Great toe pain, left   3. Achilles tendon contracture, left   4. Claw toe, acquired, left   5. Ulcer of toe of left foot, limited to breakdown of skin (HCC)     Plan: Patient has good sneakers and orthotics.  Recommended Achilles stretching.  Discussed that if he gets to the point where he is not making any progress we would need to consider a gastrocnemius recession Weil osteotomies for metatarsals 2 3 and 4 and PIP resections for toes 2 3 and 4.  Discussed that this is major surgery and he would be off his foot for a prolonged period of time.  Follow-Up Instructions: No follow-ups on file.   Ortho Exam  Patient is alert, oriented, no adenopathy, well-dressed, normal affect, normal respiratory effort. Examination patient has a good dorsalis pedis pulse bilaterally.  With his knee extended he has dorsiflexion about 10 degrees short of neutral on the left dorsiflexion 10 degrees past neutral on the right.  Patient has clawing of toes which is fixed for toes 2 3 and 4 on the left foot he also has clawing of the toes on the right foot.  Patient is painful to palpation  beneath the second third and fourth metatarsal heads there is distal migration of the fat pad.  Patient has a pressure ulcer between the third and fourth toe.  Patient was given the medical compression sock to cut up in place between the toes and recommended a medical compression sock to wick away the moisture heal these ulcers.  Imaging: Xr Toe Great Left  Result Date: 06/04/2018 2 view radiographs of the left foot shows a long second third and fourth metatarsal with degenerative changes and bony spurs the base of the first metatarsal medial cuneiform.  Xr Foot 2 Views Left  Result Date: 06/04/2018 2 view radiographs of the left foot shows a long second third and fourth metatarsal with degenerative changes and bony spurs the base of the first metatarsal medial cuneiform.  No images are attached to the encounter.  Labs: No results found for: HGBA1C, ESRSEDRATE, CRP, LABURIC, REPTSTATUS, GRAMSTAIN, CULT, LABORGA   No results found for: ALBUMIN, PREALBUMIN, LABURIC  Body mass index is 23.96 kg/m.  Orders:  Orders Placed This Encounter  Procedures  . XR Foot 2 Views Left  . XR Toe Great Left   No orders of the defined types were placed in  this encounter.    Procedures: No procedures performed  Clinical Data: No additional findings.  ROS:  All other systems negative, except as noted in the HPI. Review of Systems  Objective: Vital Signs: Ht 5\' 10"  (1.778 m)   Wt 167 lb (75.8 kg)   BMI 23.96 kg/m   Specialty Comments:  No specialty comments available.  PMFS History: Patient Active Problem List   Diagnosis Date Noted  . Tinea pedis of left foot 01/04/2014  . Osteoarthritis of foot, left 01/04/2014  . Stiffness of joint, lower leg 06/29/2013  . Strain of hamstring muscle 06/29/2013  . Right hip pain 06/22/2013  . Sciatic nerve pain 04/11/2011  . Gait abnormality 09/26/2010  . STRESS FRACTURE, FOOT 04/24/2009  . MONONEURITIS, LEG 03/27/2009   Past Medical  History:  Diagnosis Date  . Neuropathy     Family History  Problem Relation Age of Onset  . Alzheimer's disease Father   . Sudden death Neg Hx   . Hypertension Neg Hx   . Hyperlipidemia Neg Hx   . Diabetes Neg Hx   . Heart attack Neg Hx     Past Surgical History:  Procedure Laterality Date  . TONSILLECTOMY AND ADENOIDECTOMY    . wisdom surgery     wisdom teeth   Social History   Occupational History  . Not on file  Tobacco Use  . Smoking status: Never Smoker  . Smokeless tobacco: Never Used  Substance and Sexual Activity  . Alcohol use: Yes    Alcohol/week: 14.0 standard drinks    Types: 14 Glasses of wine per week  . Drug use: No  . Sexual activity: Not on file

## 2018-06-10 ENCOUNTER — Other Ambulatory Visit (INDEPENDENT_AMBULATORY_CARE_PROVIDER_SITE_OTHER): Payer: Self-pay | Admitting: *Deleted

## 2018-06-10 DIAGNOSIS — Z1211 Encounter for screening for malignant neoplasm of colon: Secondary | ICD-10-CM | POA: Insufficient documentation

## 2018-08-04 ENCOUNTER — Encounter (INDEPENDENT_AMBULATORY_CARE_PROVIDER_SITE_OTHER): Payer: Self-pay | Admitting: *Deleted

## 2018-08-04 ENCOUNTER — Telehealth (INDEPENDENT_AMBULATORY_CARE_PROVIDER_SITE_OTHER): Payer: Self-pay | Admitting: *Deleted

## 2018-08-04 MED ORDER — PEG-KCL-NACL-NASULF-NA ASC-C 140 G PO SOLR
1.0000 | Freq: Once | ORAL | 0 refills | Status: AC
Start: 2018-08-04 — End: 2018-08-04

## 2018-08-04 NOTE — Telephone Encounter (Signed)
Patient needs plenvu 

## 2018-08-28 ENCOUNTER — Telehealth: Payer: Self-pay | Admitting: Cardiovascular Disease

## 2018-08-28 NOTE — Telephone Encounter (Signed)
Virtual Visit Pre-Appointment Phone Call  "(Name), I am calling you today to discuss your upcoming appointment. We are currently trying to limit exposure to the virus that causes COVID-19 by seeing patients at home rather than in the office."  1. "What is the BEST phone number to call the day of the visit?" - include this in appointment notes  2. Do you have or have access to (through a family member/friend) a smartphone with video capability that we can use for your visit?" a. If yes - list this number in appt notes as cell (if different from BEST phone #) and list the appointment type as a VIDEO visit in appointment notes b. If no - list the appointment type as a PHONE visit in appointment notes  3. Confirm consent - "In the setting of the current Covid19 crisis, you are scheduled for a (phone or video) visit with your provider on (date) at (time).  Just as we do with many in-office visits, in order for you to participate in this visit, we must obtain consent.  If you'd like, I can send this to your mychart (if signed up) or email for you to review.  Otherwise, I can obtain your verbal consent now.  All virtual visits are billed to your insurance company just like a normal visit would be.  By agreeing to a virtual visit, we'd like you to understand that the technology does not allow for your provider to perform an examination, and thus may limit your provider's ability to fully assess your condition. If your provider identifies any concerns that need to be evaluated in person, we will make arrangements to do so.  Finally, though the technology is pretty good, we cannot assure that it will always work on either your or our end, and in the setting of a video visit, we may have to convert it to a phone-only visit.  In either situation, we cannot ensure that we have a secure connection.  Are you willing to proceed?" STAFF: Did the patient verbally acknowledge consent to telehealth visit? Document  YES/NO here: yes  4. Advise patient to be prepared - "Two hours prior to your appointment, go ahead and check your blood pressure, pulse, oxygen saturation, and your weight (if you have the equipment to check those) and write them all down. When your visit starts, your provider will ask you for this information. If you have an Apple Watch or Kardia device, please plan to have heart rate information ready on the day of your appointment. Please have a pen and paper handy nearby the day of the visit as well."  5. Give patient instructions for MyChart download to smartphone OR Doximity/Doxy.me as below if video visit (depending on what platform provider is using)  6. Inform patient they will receive a phone call 15 minutes prior to their appointment time (may be from unknown caller ID) so they should be prepared to answer    TELEPHONE CALL NOTE  Clifford Harris has been deemed a candidate for a follow-up tele-health visit to limit community exposure during the Covid-19 pandemic. I spoke with the patient via phone to ensure availability of phone/video source, confirm preferred email & phone number, and discuss instructions and expectations.  I reminded Clifford Harris to be prepared with any vital sign and/or heart rhythm information that could potentially be obtained via home monitoring, at the time of his visit. I reminded Clifford Harris to expect a phone call prior to  his visit.  Geraldine ContrasStephanie R Smith 08/28/2018 2:28 PM   INSTRUCTIONS FOR DOWNLOADING THE MYCHART APP TO SMARTPHONE  - The patient must first make sure to have activated MyChart and know their login information - If Apple, go to [employer] Store and type in MyChart in the search bar and download the app. If Android, ask patient to go to [employer] Play Store and type in [place] in the search bar and download the app. The app is free but as with any other app downloads, their phone may require them to verify saved payment information or Apple/Android  password.  - The patient will need to then log into the app with their MyChart username and password, and select [facility] as their healthcare provider to link the account. When it is time for your visit, go to the MyChart app, find appointments, and click Begin Video Visit. Be sure to Select Allow for your device to access the Microphone and Camera for your visit. You will then be connected, and your provider will be with you shortly.  **If they have any issues connecting, or need assistance please contact MyChart service desk [phone])**  **If using a computer, in order to ensure the best quality for their visit they will need to use either of the following Internet Browsers: [employer] Edge, or Google Chrome**  IF USING DOXIMITY or DOXY.ME - The patient will receive a link just prior to their visit by text.     FULL LENGTH CONSENT FOR TELE-HEALTH VISIT   I hereby voluntarily request, consent and authorize [facility] and its employed or contracted physicians, physician assistants, nurse practitioners or other licensed health care professionals (the Practitioner), to provide me with telemedicine health care services (the Services") as deemed necessary by the treating Practitioner. I acknowledge and consent to receive the Services by the Practitioner via telemedicine. I understand that the telemedicine visit will involve communicating with the Practitioner through live audiovisual communication technology and the disclosure of certain medical information by electronic transmission. I acknowledge that I have been given the opportunity to request an in-person assessment or other available alternative prior to the telemedicine visit and am voluntarily participating in the telemedicine visit.  I understand that I have the right to withhold or withdraw my consent to the use of telemedicine in the course of my care at any time, without affecting my right to future care or treatment,  and that the Practitioner or I may terminate the telemedicine visit at any time. I understand that I have the right to inspect all information obtained and/or recorded in the course of the telemedicine visit and may receive copies of available information for a reasonable fee.  I understand that some of the potential risks of receiving the Services via telemedicine include:   Delay or interruption in medical evaluation due to technological equipment failure or disruption;  Information transmitted may not be sufficient (e.g. poor resolution of images) to allow for appropriate medical decision making by the Practitioner; and/or   In rare instances, security protocols could fail, causing a breach of personal health information.  Furthermore, I acknowledge that it is my responsibility to provide information about my medical history, conditions and care that is complete and accurate to the best of my ability. I acknowledge that Practitioner's advice, recommendations, and/or decision may be based on factors not within their control, such as incomplete or inaccurate data provided by me or distortions of diagnostic images or specimens that may result from electronic transmissions. I  understand that the practice of medicine is not an exact science and that Practitioner makes no warranties or guarantees regarding treatment outcomes. I acknowledge that I will receive a copy of this consent concurrently upon execution via email to the email address I last provided but may also request a printed copy by calling the office of [place].    I understand that my insurance will be billed for this visit.   I have read or had this consent read to me.  I understand the contents of this consent, which adequately explains the benefits and risks of the Services being provided via telemedicine.   I have been provided ample opportunity to ask questions regarding this consent and the Services and have had my questions  answered to my satisfaction.  I give my informed consent for the services to be provided through the use of telemedicine in my medical care  By participating in this telemedicine visit I agree to the above.

## 2018-09-06 NOTE — Progress Notes (Signed)
Virtual Visit via Video Note   This visit type was conducted due to national recommendations for restrictions regarding the COVID-19 Pandemic (e.g. social distancing) in an effort to limit this patient's exposure and mitigate transmission in our community.  Due to his co-morbid illnesses, this patient is at least at moderate risk for complications without adequate follow up.  This format is felt to be most appropriate for this patient at this time.  All issues noted in this document were discussed and addressed.  A limited physical exam was performed with this format.  Please refer to the patient's chart for his consent to telehealth [facility].   Date:  09/08/2018   ID:  Karren CobbleDavid P Cubit, DOB 09/14/1955, MRN [id number]  Patient Location: Home Provider Location: Office  PCP:  Assunta FoundGolding, John, MD  Cardiologist:  Eden EmmsNishan Electrophysiologist:  None   Evaluation Performed:  Follow-Up Visit  Chief Complaint:  Abnormal ECG   History of Present Illness:    63 y.o. first seen 08/21/16 for MVP, abnormal ECG and palpitations Normal cath 2006 after having chest pain running marathon. Unusual ventricular morphology with f/u MRI suggesting mild bi leaflet prolapse some thickening of apex but no gad uptake or true HOCM  Chronically abnormal ECG with diffuse T wave inversions Faints easily Has run over 30 marathons   MRI 08/21/16:  EF 66% septum 12 mm no SAM with likely apical hypertrophic DCM spade morphology apical hyperenhancement   Echo: 08/20/16 mild bi leaflet prolapse trivial MR no mention apical hypertrophy Cardiac MRI 08/21/16 EF 66% apical scarring consistent with apical hypertrophic DCM  He has not had syncope in years. He does note 2x/year he has sudden increase in HR while running with palpitations but no lightheadedness I discussed the diagnosis of apical hypertrophic DCM. And possible need for further w/u risk VT/NSVT  Seen by GT and no AICD recommended ILR was recommended and patient  declined. 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 Operation would be major Doing stretching/PT  No palpitations or pre syncope No chest pain Has a 416 month old grand baby with him during quarantine   The patient does not have symptoms concerning for COVID-19 infection (fever, chills, cough, or new shortness of breath).    Past Medical History:  Diagnosis Date  . Neuropathy    Past Surgical History:  Procedure Laterality Date  . TONSILLECTOMY AND ADENOIDECTOMY    . wisdom surgery     wisdom teeth     Current Meds  Medication Sig  . diclofenac sodium (VOLTAREN) 1 % GEL Apply 2 g topically 2 (two) times daily.  Marland Kitchen. escitalopram (LEXAPRO) 10 MG tablet Take 10 mg by mouth daily.   Marland Kitchen. gabapentin (NEURONTIN) 300 MG capsule Take 300 mg by mouth 4 (four) times daily.  . traMADol (ULTRAM) 50 MG tablet Take 100 mg by mouth 2 (two) times daily.     Allergies:   Patient has no known allergies.   Social History   Tobacco Use  . Smoking status: Never Smoker  . Smokeless tobacco: Never Used  Substance Use Topics  . Alcohol use: Yes    Alcohol/week: 14.0 standard drinks    Types: 14 Glasses of wine per week  . Drug use: No     Family Hx: The patient's family history includes Alzheimer's disease in his father. There is no history of Sudden death, Hypertension, Hyperlipidemia, Diabetes,  or Heart attack.  ROS:   Please see the history of present illness.     All other systems reviewed and are negative.   Prior CV studies:   The following studies were reviewed today:  Cardiac MRI 08/21/16  Labs/Other Tests and Data Reviewed:    EKG:   07/23/17 SR ICRBBB deep T wave inversions inferior / lateral leads first degree AV block   Recent Labs: No results found for requested labs within last 8760 hours.   Recent Lipid Panel No results found for: CHOL, TRIG, HDL, CHOLHDL, LDLCALC, LDLDIRECT  Wt Readings from Last  3 Encounters:  09/08/18 72.6 kg  06/04/18 75.8 kg  08/20/17 75.8 kg     Objective:    Vital Signs:  Pulse 64   Ht 5\' 10"  (1.778 m)   Wt 72.6 kg   BMI 22.96 kg/m    Skin warm and dry No distress No JVP elevation No tachypnea  No edema  ASSESSMENT & PLAN:    1. Apical Hypertrophic DCM:  With some scarring apex MRI 2018 and consistent ECG Seen by Dr Ladona Ridgel who recommended ILR and watchful waiting Will arrange f/u with GT to discuss this again I assured patient that ILR is benign procedure See me with echo in a year   COVID-19 Education: The signs and symptoms of COVID-19 were discussed with the patient and how to seek care for testing (follow up with PCP or arrange E-visit).  The importance of social distancing was discussed today.  Time:   Today, I have spent 30 minutes with the patient with telehealth technology discussing the above problems.     Medication Adjustments/Labs and Tests Ordered: Current medicines are reviewed at length with the patient today.  Concerns regarding medicines are outlined above.   Tests Ordered: No orders of the defined types were placed in this encounter.   Medication Changes: No orders of the defined types were placed in this encounter.   Disposition:  Follow up in a year   Signed, Charlton Haws, MD  09/08/2018 9:01 AM    [facility]

## 2018-09-08 ENCOUNTER — Encounter: Payer: Self-pay | Admitting: Cardiovascular Disease

## 2018-09-08 ENCOUNTER — Telehealth (INDEPENDENT_AMBULATORY_CARE_PROVIDER_SITE_OTHER): Payer: BC Managed Care – PPO | Admitting: Cardiovascular Disease

## 2018-09-08 VITALS — HR 64 | Ht 70.0 in | Wt 160.0 lb

## 2018-09-08 DIAGNOSIS — I422 Other hypertrophic cardiomyopathy: Secondary | ICD-10-CM

## 2018-09-08 NOTE — Progress Notes (Signed)
Medication Instructions:  Your physician recommends that you continue on your current medications as directed. Please refer to the Current Medication list given to you today.   Labwork: none  Testing/Procedures: Your physician has requested that you have an echocardiogram. Echocardiography is a painless test that uses sound waves to create images of your heart. It provides your doctor with information about the size and shape of your heart and how well your heart's chambers and valves are working. This procedure takes approximately one hour. There are no restrictions for this procedure. Same day as 1 year f/u with Dr. Eden Emms   Follow-Up: Your physician recommends that you schedule a follow-up appointment in: 6 months with Dr. Ladona Ridgel  Your physician wants you to follow-up in: 1 year with Dr. Eden Emms. You will receive a reminder letter in the mail two months in advance. If you don't receive a letter, please call our office to schedule the follow-up appointment.    Any Other Special Instructions Will Be Listed Below (If Applicable).     If you need a refill on your cardiac medications before your next appointment, please call your pharmacy.

## 2018-09-24 DIAGNOSIS — Z1211 Encounter for screening for malignant neoplasm of colon: Principal | ICD-10-CM

## 2018-12-15 ENCOUNTER — Encounter (INDEPENDENT_AMBULATORY_CARE_PROVIDER_SITE_OTHER): Payer: Self-pay | Admitting: *Deleted

## 2019-01-04 ENCOUNTER — Ambulatory Visit (INDEPENDENT_AMBULATORY_CARE_PROVIDER_SITE_OTHER): Payer: Self-pay

## 2019-01-04 ENCOUNTER — Other Ambulatory Visit: Payer: Self-pay

## 2019-01-04 ENCOUNTER — Telehealth (INDEPENDENT_AMBULATORY_CARE_PROVIDER_SITE_OTHER): Payer: Self-pay | Admitting: *Deleted

## 2019-01-04 NOTE — Telephone Encounter (Signed)
Referring MD/PCP: golding   Procedure: tcs  Reason/Indication:  screening  Has patient had this procedure before?  no  If so, when, by whom and where?    Is there a family history of colon cancer?  no  Who?  What age when diagnosed?    Is patient diabetic?   no      Does patient have prosthetic heart valve or mechanical valve?  no  Do you have a pacemaker/defibrillator?  no  Has patient ever had endocarditis/atrial fibrillation? no  Does patient use oxygen? no  Has patient had joint replacement within last 12 months?  no  Is patient constipated or do they take laxatives? no  Does patient have a history of alcohol/drug use?  no  Is patient on blood thinner such as Coumadin, Plavix and/or Aspirin? on  Medications: advil pm 2-3 times a week,, vit b12 daily, lexapro 10 mg daily, gabapentin 300 mg daily  Allergies: nkda  Medication Adjustment per Dr Charlena Cross, NP:   Procedure date & time: 02/03/19 at 730

## 2019-01-13 NOTE — Telephone Encounter (Signed)
Okay to schedule colonoscopy with conscious sedation 

## 2019-02-01 ENCOUNTER — Other Ambulatory Visit (HOSPITAL_COMMUNITY)
Admission: RE | Admit: 2019-02-01 | Discharge: 2019-02-01 | Disposition: A | Discharge status: Home / Self Care | Payer: BC Managed Care – PPO | Source: Ambulatory Visit | Attending: Internal Medicine | Admitting: Internal Medicine

## 2019-02-01 DIAGNOSIS — Z20828 Contact with and (suspected) exposure to other viral communicable diseases: Secondary | ICD-10-CM | POA: Diagnosis not present

## 2019-02-01 DIAGNOSIS — K644 Residual hemorrhoidal skin tags: Secondary | ICD-10-CM | POA: Insufficient documentation

## 2019-02-01 DIAGNOSIS — K648 Other hemorrhoids: Secondary | ICD-10-CM | POA: Insufficient documentation

## 2019-02-01 DIAGNOSIS — Z01812 Encounter for preprocedural laboratory examination: Secondary | ICD-10-CM | POA: Diagnosis present

## 2019-02-01 LAB — SARS CORONAVIRUS 2 (TAT 6-24 HRS): SARS Coronavirus 2: NEGATIVE

## 2019-02-03 ENCOUNTER — Other Ambulatory Visit: Payer: Self-pay

## 2019-02-03 ENCOUNTER — Ambulatory Visit (HOSPITAL_COMMUNITY)
Admission: RE | Admit: 2019-02-03 | Discharge: 2019-02-03 | Disposition: A | Discharge status: Home / Self Care | Payer: BC Managed Care – PPO | Attending: Internal Medicine | Admitting: Internal Medicine

## 2019-02-03 ENCOUNTER — Encounter (HOSPITAL_COMMUNITY): Payer: Self-pay | Admitting: *Deleted

## 2019-02-03 ENCOUNTER — Encounter (HOSPITAL_COMMUNITY)
Admission: RE | Disposition: A | Discharge status: Home / Self Care | Payer: Self-pay | Source: Home / Self Care | Attending: Internal Medicine

## 2019-02-03 DIAGNOSIS — G629 Polyneuropathy, unspecified: Secondary | ICD-10-CM | POA: Insufficient documentation

## 2019-02-03 DIAGNOSIS — M1990 Unspecified osteoarthritis, unspecified site: Secondary | ICD-10-CM | POA: Insufficient documentation

## 2019-02-03 DIAGNOSIS — Z79899 Other long term (current) drug therapy: Secondary | ICD-10-CM | POA: Insufficient documentation

## 2019-02-03 DIAGNOSIS — K648 Other hemorrhoids: Secondary | ICD-10-CM

## 2019-02-03 DIAGNOSIS — K644 Residual hemorrhoidal skin tags: Secondary | ICD-10-CM | POA: Diagnosis not present

## 2019-02-03 DIAGNOSIS — Z1211 Encounter for screening for malignant neoplasm of colon: Secondary | ICD-10-CM | POA: Insufficient documentation

## 2019-02-03 DIAGNOSIS — F329 Major depressive disorder, single episode, unspecified: Secondary | ICD-10-CM | POA: Diagnosis not present

## 2019-02-03 HISTORY — DX: Depression, unspecified: F32.A

## 2019-02-03 HISTORY — PX: COLONOSCOPY: SHX5424

## 2019-02-03 HISTORY — DX: Unspecified osteoarthritis, unspecified site: M19.90

## 2019-02-03 SURGERY — COLONOSCOPY
Anesthesia: Moderate Sedation

## 2019-02-03 MED ORDER — MEPERIDINE HCL 50 MG/ML IJ SOLN
INTRAMUSCULAR | Status: DC | PRN
  Administered 2019-02-03 (×2): 25 mg via INTRAVENOUS

## 2019-02-03 MED ORDER — MEPERIDINE HCL 50 MG/ML IJ SOLN
INTRAMUSCULAR | Status: AC
  Filled 2019-02-03: qty 1

## 2019-02-03 MED ORDER — MIDAZOLAM HCL 5 MG/5ML IJ SOLN
INTRAMUSCULAR | Status: AC
  Filled 2019-02-03: qty 10

## 2019-02-03 MED ORDER — SODIUM CHLORIDE 0.9 % IV SOLN
INTRAVENOUS | Status: DC
  Administered 2019-02-03: 07:00:00 via INTRAVENOUS

## 2019-02-03 MED ORDER — MIDAZOLAM HCL 5 MG/5ML IJ SOLN
INTRAMUSCULAR | Status: DC | PRN
  Administered 2019-02-03 (×3): 2 mg via INTRAVENOUS

## 2019-02-03 NOTE — H&P (Signed)
Clifford Harris is an 63 y.o. male.   Chief Complaint: Patient is here for colonoscopy. HPI: Patient is 63 year old Caucasian male who is here for screening colonoscopy.  Last exam was normal 14 years ago.  His bowels are usually regular.  He denies abdominal pain.  States he had 2 episodes earlier this year when he noted small amount of fresh blood with his bowel movements.  States he was not alarmed.  He feels hemorrhoids. Family history is negative for CRC.  Past Medical History:  Diagnosis Date  . Arthritis   . Depression   . Neuropathy     Past Surgical History:  Procedure Laterality Date  . TONSILLECTOMY AND ADENOIDECTOMY    . WISDOM TOOTH EXTRACTION      Family History  Problem Relation Age of Onset  . Alzheimer's disease Father   . Sudden death Neg Hx   . Hypertension Neg Hx   . Hyperlipidemia Neg Hx   . Diabetes Neg Hx   . Heart attack Neg Hx   . Colon cancer Neg Hx    Social History:  reports that he has never smoked. He has never used smokeless tobacco. He reports current alcohol use of about 14.0 standard drinks of alcohol per week. He reports that he does not use drugs.  Allergies: No Known Allergies  Medications Prior to Admission  Medication Sig Dispense Refill  . carboxymethylcellulose (REFRESH PLUS) 0.5 % SOLN Place 1 drop into both eyes 2 (two) times daily as needed (dry eyes).    . Cyanocobalamin (B-12) 2500 MCG TABS Take 2,500 mcg by mouth daily.    . diclofenac sodium (VOLTAREN) 1 % GEL Apply 2 g topically 2 (two) times daily.    Marland Kitchen escitalopram (LEXAPRO) 10 MG tablet Take 10 mg by mouth daily.     Marland Kitchen gabapentin (NEURONTIN) 300 MG capsule Take 300 mg by mouth 2 (two) times daily.     Marland Kitchen ibuprofen (ADVIL) 200 MG tablet Take 200 mg by mouth at bedtime as needed for moderate pain.    . traMADol (ULTRAM) 50 MG tablet Take 100 mg by mouth 2 (two) times daily.      No results found for this or any previous visit (from the past 48 hour(s)). No results  found.  ROS  Blood pressure (!) 159/83, pulse 62, temperature 97.6 F (36.4 C), temperature source Oral, resp. rate 14, height 5\' 10"  (1.778 m), weight 69.4 kg, SpO2 99 %. Physical Exam  Constitutional: He appears well-developed and well-nourished.  HENT:  Mouth/Throat: Oropharynx is clear and moist.  Eyes: Conjunctivae are normal. No scleral icterus.  Neck: No thyromegaly present.  Cardiovascular: Normal rate, regular rhythm and normal heart sounds.  No murmur heard. Respiratory: Effort normal and breath sounds normal.  GI: Soft. He exhibits no distension and no mass.  Musculoskeletal:        General: No edema.  Lymphadenopathy:    He has no cervical adenopathy.  Neurological: He is alert.  Skin: Skin is warm and dry.     Assessment/Plan Average risk screening colonoscopy.  Hildred Laser, MD 02/03/2019, 7:30 AM

## 2019-02-03 NOTE — Op Note (Signed)
[facility] Patient Name: Clifford Harris Procedure Date: 02/03/2019 7:30 AM MRN: [id number] Date of Birth: 12-09-1955 Attending MD: Lionel December , MD CSN: [id number] Age: 63 Admit Type: Outpatient Procedure:                Colonoscopy Indications:              Screening for colorectal malignant neoplasm Providers:                Lionel December, MD, Judee Clara, RN, Edythe Clarity,                            Technician Referring MD:             Corrie Mckusick, MD Medicines:                Meperidine 50 mg IV, Midazolam 6 mg IV Complications:            No immediate complications. Estimated Blood Loss:     Estimated blood loss: none. Procedure:                Pre-Anesthesia Assessment:                           - Prior to the procedure, a History and Physical                            was performed, and patient medications and                            allergies were reviewed. The patient's tolerance of                            previous anesthesia was also reviewed. The risks                            and benefits of the procedure and the sedation                            options and risks were discussed with the patient.                            All questions were answered, and informed consent                            was obtained. Prior Anticoagulants: The patient has                            taken no previous anticoagulant or antiplatelet                            agents except for NSAID medication. ASA Grade                            Assessment: II - A patient with mild systemic  disease. After reviewing the risks and benefits,                            the patient was deemed in satisfactory condition to                            undergo the procedure.                           After obtaining informed consent, the colonoscope                            was passed under direct vision. Throughout the                            procedure,  the patient's blood pressure, pulse, and                            oxygen saturations were monitored continuously. The                            [id number]-[id number] ([id number]) scope was introduced through                            the anus and advanced to the the cecum, identified                            by appendiceal orifice and ileocecal valve. The                            colonoscopy was performed without difficulty. The                            patient tolerated the procedure well. The quality                            of the bowel preparation was excellent. Scope In: 7:41:58 AM Scope Out: 7:58:08 AM Scope Withdrawal Time: 0 hours 7 minutes 14 seconds  Total Procedure Duration: 0 hours 16 minutes 10 seconds  Findings:      The perianal and digital rectal examinations were normal.      The colon (entire examined portion) appeared normal.      External and internal hemorrhoids were found during retroflexion. The       hemorrhoids were small. Impression:               - The entire examined colon is normal.                           - External and internal hemorrhoids.                           - No specimens collected. Moderate Sedation:      Moderate (conscious) sedation was administered by the endoscopy nurse       and supervised by the endoscopist. The following parameters were  monitored: oxygen saturation, heart rate, blood pressure, CO2       capnography and response to care. Total physician intraservice time was       23 minutes. Recommendation:           - Patient has a contact number available for                            emergencies. The signs and symptoms of potential                            delayed complications were discussed with the                            patient. Return to normal activities tomorrow.                            Written discharge instructions were provided to the                            patient.                           - High fiber  diet today.                           - Continue present medications.                           - Repeat colonoscopy in 10 years for screening                            purposes. Procedure Code(s):        --- Professional ---                           [phone], Colonoscopy, flexible; diagnostic, including                            collection of specimen(s) by brushing or washing,                            when performed (separate procedure)                           99153, Moderate sedation; each additional 15                            minutes intraservice time                           G0500, Moderate sedation services provided by the                            same physician or other qualified health care  professional performing a gastrointestinal                            endoscopic service that sedation supports,                            requiring the presence of an independent trained                            observer to assist in the monitoring of the                            patient's level of consciousness and physiological                            status; initial 15 minutes of intra-service time;                            patient age 11 years or older (additional time may                            be reported with [id number], as appropriate) Diagnosis Code(s):        --- Professional ---                           Z12.11, Encounter for screening for malignant                            neoplasm of colon                           K64.8, Other hemorrhoids CPT copyright 2019 American Medical Association. All rights reserved. The codes documented in this report are preliminary and upon coder review may  be revised to meet current compliance requirements. Lionel DecemberNajeeb Aylen Rambert, MD Lionel DecemberNajeeb Mylin Gignac, MD 02/03/2019 8:06:52 AM This report has been signed electronically. Number of Addenda: 0

## 2019-02-03 NOTE — Discharge Instructions (Signed)
Resume usual medications as before. High-fiber diet. No driving for 24 hours. Next screening exam in 10 years.   Colonoscopy, Adult, Care After This sheet gives you information about how to care for yourself after your procedure. Your health care provider may also give you more specific instructions. If you have problems or questions, contact your health care provider. Dr. Laural Golden:  [phone].  What can I expect after the procedure? After the procedure, it is common to have:  A small amount of blood in your stool for 24 hours after the procedure.  Some gas.  Mild abdominal cramping or bloating. Follow these instructions at home:  General instructions  For the first 24 hours after the procedure: ? Do not drive or use machinery. ? Do not sign important documents. ? Do not drink alcohol. ? Do your regular daily activities at a slower pace than normal.   Take over-the-counter or prescription medicines only as told by your health care provider. Relieving cramping and bloating   Try walking around when you have cramps or feel bloated.  Eating and drinking   Drink enough fluid to keep your urine pale yellow.  Resume your normal diet as instructed by your health care provider.  Avoid drinking alcohol for 24 hours as instructed by your health care provider. Contact a health care provider if:  You have blood in your stool 2-3 days after the procedure. Get help right away if:  You have more than a small spotting of blood in your stool.  You pass large blood clots in your stool.  Your abdomen is swollen.  You have nausea or vomiting.  You have a fever.  You have increasing abdominal pain that is not relieved with medicine. Summary  After the procedure, it is common to have a small amount of blood in your stool. You may also have mild abdominal cramping and bloating.  For the first 24 hours after the procedure, do not drive or use machinery, sign important  documents, or drink alcohol.  Contact your health care provider if you have a lot of blood in your stool, nausea or vomiting, a fever, or increased abdominal pain. This information is not intended to replace advice given to you by your health care provider. Make sure you discuss any questions you have with your health care provider. Document Released: 12/05/2003 Document Revised: 02/12/2017 Document Reviewed: 07/04/2015 Elsevier Patient Education  [place].  High-Fiber Diet Fiber, also called dietary fiber, is a type of carbohydrate that is found in fruits, vegetables, whole grains, and beans. A high-fiber diet can have many health benefits. Your health care provider may recommend a high-fiber diet to help:  Prevent constipation. Fiber can make your bowel movements more regular.  Lower your cholesterol.  Relieve the following conditions: ? Swelling of veins in the anus (hemorrhoids). ? Swelling and irritation (inflammation) of specific areas of the digestive tract (uncomplicated diverticulosis). ? A problem of the large intestine (colon) that sometimes causes pain and diarrhea (irritable bowel syndrome, IBS).  Prevent overeating as part of a weight-loss plan.  Prevent heart disease, type 2 diabetes, and certain cancers. What is my plan? The recommended daily fiber intake in grams (g) includes:  38 g for men age 42 or younger.  30 g for men over age 75.  4 g for women age 65 or younger.  21 g for women over age 85. You can get the recommended daily intake of dietary fiber by:  Eating a variety of fruits,  vegetables, grains, and beans.  Taking a fiber supplement, if it is not possible to get enough fiber through your diet. What do I need to know about a high-fiber diet?  It is better to get fiber through food sources rather than from fiber supplements. There is not a lot of research about how effective supplements are.  Always check the fiber content on the nutrition  facts label of any prepackaged food. Look for foods that contain 5 g of fiber or more per serving.  Talk with a diet and nutrition specialist (dietitian) if you have questions about specific foods that are recommended or not recommended for your medical condition, especially if those foods are not listed below.  Gradually increase how much fiber you consume. If you increase your intake of dietary fiber too quickly, you may have bloating, cramping, or gas.  Drink plenty of water. Water helps you to digest fiber. What are tips for following this plan?  Eat a wide variety of high-fiber foods.  Make sure that half of the grains that you eat each day are whole grains.  Eat breads and cereals that are made with whole-grain flour instead of refined flour or white flour.  Eat brown rice, bulgur wheat, or millet instead of white rice.  Start the day with a breakfast that is high in fiber, such as a cereal that contains 5 g of fiber or more per serving.  Use beans in place of meat in soups, salads, and pasta dishes.  Eat high-fiber snacks, such as berries, raw vegetables, nuts, and popcorn.  Choose whole fruits and vegetables instead of processed forms like juice or sauce. What foods can I eat?  Fruits Berries. Pears. Apples. Oranges. Avocado. Prunes and raisins. Dried figs. Vegetables Sweet potatoes. Spinach. Kale. Artichokes. Cabbage. Broccoli. Cauliflower. Green peas. Carrots. Squash. Grains Whole-grain breads. Multigrain cereal. Oats and oatmeal. Brown rice. Barley. Bulgur wheat. Millet. Quinoa. Bran muffins. Popcorn. Rye wafer crackers. Meats and other proteins Navy, kidney, and pinto beans. Soybeans. Split peas. Lentils. Nuts and seeds. Dairy Fiber-fortified yogurt. Beverages Fiber-fortified soy milk. Fiber-fortified orange juice. Other foods Fiber bars. The items listed above may not be a complete list of recommended foods and beverages. Contact a dietitian for more  options. What foods are not recommended? Fruits Fruit juice. Cooked, strained fruit. Vegetables Fried potatoes. Canned vegetables. Well-cooked vegetables. Grains White bread. Pasta made with refined flour. White rice. Meats and other proteins Fatty cuts of meat. Fried chicken or fried fish. Dairy Milk. Yogurt. Cream cheese. Sour cream. Fats and oils Butters. Beverages Soft drinks. Other foods Cakes and pastries. The items listed above may not be a complete list of foods and beverages to avoid. Contact a dietitian for more information. Summary  Fiber is a type of carbohydrate. It is found in fruits, vegetables, whole grains, and beans.  There are many health benefits of eating a high-fiber diet, such as preventing constipation, lowering blood cholesterol, helping with weight loss, and reducing your risk of heart disease, diabetes, and certain cancers.  Gradually increase your intake of fiber. Increasing too fast can result in cramping, bloating, and gas. Drink plenty of water while you increase your fiber.  The best sources of fiber include whole fruits and vegetables, whole grains, nuts, seeds, and beans. This information is not intended to replace advice given to you by your health care provider. Make sure you discuss any questions you have with your health care provider. Document Released: 04/22/2005 Document Revised: 02/24/2017 Document Reviewed: 02/24/2017  Elsevier Patient Education  [employer].

## 2019-02-08 ENCOUNTER — Encounter (HOSPITAL_COMMUNITY): Payer: Self-pay | Admitting: Internal Medicine

## 2019-03-01 NOTE — Progress Notes (Signed)
Date:  03/08/2019   ID:  Clifford Harris, DOB [birth year], MRN [id number]  Provider Location: Office  PCP:  Assunta Found, MD  Cardiologist:  Eden Emms Electrophysiologist:  None   Evaluation Performed:  Follow-Up Visit  Chief Complaint:  Abnormal ECG   History of Present Illness:    63 y.o. first seen 08/21/16 for MVP, abnormal ECG and palpitations Normal cath 2006 after having chest pain running marathon. Unusual ventricular morphology with f/u MRI suggesting mild bi leaflet prolapse some thickening of apex but no gad uptake or true HOCM  Chronically abnormal ECG with diffuse T wave inversions Faints easily Has run over 30 marathons   MRI 08/21/16:  EF 66% septum 12 mm no SAM with likely apical hypertrophic DCM spade morphology apical hyperenhancement   Echo: 08/20/16 mild bi leaflet prolapse trivial MR no mention apical hypertrophy Cardiac MRI 08/21/16 EF 66% apical scarring consistent with apical hypertrophic DCM  He has not had syncope in years. He does note 2x/year he has sudden increase in HR while running with palpitations but no lightheadedness I discussed the diagnosis of apical hypertrophic DCM. And possible need for further w/u risk VT/NSVT  Seen by [facility] and no AICD recommended ILR was recommended and patient declined.   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 Operation would be major Doing stretching/PT  No palpitations or pre syncope No chest pain Has a 4 month old grand baby with him during quarantine   Doing a lot of camping during COVID  Lots of questions regarding ILR   The patient does not have symptoms concerning for COVID-19 infection (fever, chills, cough, or new shortness of breath).    Past Medical History:  Diagnosis Date  . Arthritis   . Depression   . Neuropathy    Past Surgical History:  Procedure Laterality Date  . COLONOSCOPY N/A 02/03/2019   Procedure: COLONOSCOPY;  Surgeon: Malissa Hippo, MD;  Location: [facility]  ENDO SUITE;  Service: Endoscopy;  Laterality: N/A;  730   . TONSILLECTOMY AND ADENOIDECTOMY    . WISDOM TOOTH EXTRACTION       Current Meds  Medication Sig  . carboxymethylcellulose (REFRESH PLUS) 0.5 % SOLN Place 1 drop into both eyes 2 (two) times daily as needed (dry eyes).  . Cyanocobalamin (B-12) 2500 MCG TABS Take 2,500 mcg by mouth daily.  . diclofenac sodium (VOLTAREN) 1 % GEL Apply 2 g topically 2 (two) times daily.  Marland Kitchen escitalopram (LEXAPRO) 10 MG tablet Take 10 mg by mouth daily.   Marland Kitchen gabapentin (NEURONTIN) 300 MG capsule Take 300 mg by mouth 2 (two) times daily.   Marland Kitchen ibuprofen (ADVIL) 200 MG tablet Take 200 mg by mouth at bedtime as needed for moderate pain.  . traMADol (ULTRAM) 50 MG tablet Take 100 mg by mouth 2 (two) times daily.     Allergies:   Patient has no known allergies.   Social History   Tobacco Use  . Smoking status: Never Smoker  . Smokeless tobacco: Never Used  Substance Use Topics  . Alcohol use: Yes    Alcohol/week: 14.0 standard drinks    Types: 14 Glasses of wine per week  . Drug use: No     Family Hx: The patient's family history includes Alzheimer's disease in his father. There is no history of Sudden death, Hypertension, Hyperlipidemia, Diabetes, Heart attack, or Colon cancer.  ROS:   Please see the history of present illness.  All other systems reviewed and are negative.   Prior CV studies:   The following studies were reviewed today:  Cardiac MRI 08/21/16  Labs/Other Tests and Data Reviewed:    EKG:   07/23/17 SR ICRBBB deep T wave inversions inferior / lateral leads first degree AV block   Recent Labs: No results found for requested labs within last 8760 hours.   Recent Lipid Panel No results found for: CHOL, TRIG, HDL, CHOLHDL, LDLCALC, LDLDIRECT  Wt Readings from Last 3 Encounters:  03/08/19 162 lb (73.5 kg)  02/03/19 153 lb (69.4 kg)  09/08/18 160 lb (72.6 kg)     Objective:    Vital Signs:  BP (!) 158/75   Pulse  (!) 57   Temp 98.2 F (36.8 C) (Temporal)   Ht 5\' 10"  (1.778 m)   Wt 162 lb (73.5 kg)   SpO2 96%   BMI 23.24 kg/m    Affect appropriate Healthy:  appears stated age HEENT: normal Neck supple with no adenopathy JVP normal no bruits no thyromegaly Lungs clear with no wheezing and good diaphragmatic motion Heart:  S1/S2 no murmur, no rub, gallop or click PMI normal Abdomen: benighn, BS positve, no tenderness, no AAA no bruit.  No HSM or HJR Distal pulses intact with no bruits No edema Neuro non-focal Skin warm and dry No muscular weakness  ASSESSMENT & PLAN:    Apical Hypertrophic DCM:  With some scarring apex MRI 2018 and consistent ECG Seen by Dr Lovena Le who recommended ILR and watchful waiting  Has f/u with [facility] to further discuss ILR. Had echo today which I will Review latter I think biggest issue currently is risk stratification for sudden death   COVID-19 Education:  The signs and symptoms of COVID-19 were discussed with the patient and how to seek care for testing (follow up with PCP or arrange E-visit).  The importance of social distancing was discussed today.  Time:   Today, I have spent 30 minutes with the patient     Medication Adjustments/Labs and Tests Ordered: Current medicines are reviewed at length with the patient today.  Concerns regarding medicines are outlined above.   Tests Ordered:  To review echo done today 03/08/19   Medication Changes: No orders of the defined types were placed in this encounter.   Disposition:  Follow up with [facility] in 2 weeks   Signed, Jenkins Rouge, MD  03/08/2019 10:15 AM    [place]

## 2019-03-08 ENCOUNTER — Other Ambulatory Visit: Payer: Self-pay

## 2019-03-08 ENCOUNTER — Encounter: Payer: Self-pay | Admitting: Cardiovascular Disease

## 2019-03-08 ENCOUNTER — Ambulatory Visit: Payer: BC Managed Care – PPO | Admitting: Cardiovascular Disease

## 2019-03-08 ENCOUNTER — Ambulatory Visit (HOSPITAL_COMMUNITY)
Admission: RE | Admit: 2019-03-08 | Discharge: 2019-03-08 | Disposition: A | Discharge status: Home / Self Care | Payer: BC Managed Care – PPO | Source: Ambulatory Visit | Attending: Cardiovascular Disease | Admitting: Cardiovascular Disease

## 2019-03-08 VITALS — BP 158/75 | HR 57 | Temp 98.2°F | Ht 70.0 in | Wt 162.0 lb

## 2019-03-08 DIAGNOSIS — I422 Other hypertrophic cardiomyopathy: Secondary | ICD-10-CM | POA: Diagnosis not present

## 2019-03-08 DIAGNOSIS — I421 Obstructive hypertrophic cardiomyopathy: Secondary | ICD-10-CM | POA: Diagnosis not present

## 2019-03-08 NOTE — Progress Notes (Signed)
Echocardiogram 2D Echocardiogram has been performed.  Clifford Harris 03/08/2019, 9:14 AM

## 2019-03-08 NOTE — Patient Instructions (Signed)
Medication Instructions:  Your physician recommends that you continue on your current medications as directed. Please refer to the Current Medication list given to you today.   Labwork: none  Testing/Procedures: none  Follow-Up: Your physician wants you to follow-up in: 1 year with Dr. Dorathy Daft.  You will receive a reminder letter in the mail two months in advance. If you don't receive a letter, please call our office to schedule the follow-up appointment.   Any Other Special Instructions Will Be Listed Below (If Applicable).  Keep appointment with Dr. Lovena Le on 03/23/2019 @ 10:45    If you need a refill on your cardiac medications before your next appointment, please call your pharmacy.

## 2019-03-10 ENCOUNTER — Telehealth: Payer: Self-pay

## 2019-03-10 NOTE — Telephone Encounter (Signed)
Returned call

## 2019-03-10 NOTE — Telephone Encounter (Signed)
Called pt. No answer, left message for pt to return call.  

## 2019-03-10 NOTE — Telephone Encounter (Signed)
-----   Message from Josue Hector, MD sent at 03/08/2019  2:22 PM EST ----- Echo no change EF normal no bad valves apical HOCM

## 2019-03-10 NOTE — Telephone Encounter (Signed)
Pt.notified

## 2019-03-18 ENCOUNTER — Telehealth: Payer: Self-pay

## 2019-03-18 NOTE — Telephone Encounter (Signed)
New message    Patient wants to know if he can bring his wife to his appointment with him, he doesn't have any physical or mental restrictions requiring assistance he just wants her at the appointment. Please call pt and advise.

## 2019-03-18 NOTE — Telephone Encounter (Signed)
Due to the increased positivity rate with COVID, no visitors are allowed. I suggested he facetime his wife while he is in the apt, he liked that idea.

## 2019-03-18 NOTE — Telephone Encounter (Signed)
I will ask Dr.T nurse to see how he handles thisaylors

## 2019-03-23 ENCOUNTER — Encounter: Payer: Self-pay | Admitting: Internal Medicine

## 2019-03-23 ENCOUNTER — Other Ambulatory Visit: Payer: Self-pay

## 2019-03-23 ENCOUNTER — Ambulatory Visit: Payer: BC Managed Care – PPO | Admitting: Internal Medicine

## 2019-03-23 VITALS — BP 145/79 | HR 59 | Temp 96.6°F | Ht 70.0 in | Wt 165.0 lb

## 2019-03-23 DIAGNOSIS — I422 Other hypertrophic cardiomyopathy: Secondary | ICD-10-CM | POA: Diagnosis not present

## 2019-03-23 NOTE — Progress Notes (Signed)
HPI Clifford Harris is referred back today by Dr. Johnsie Cancel for evaluation of HCM for risk stratification. He has a remote h/o syncope in the 1980's. He has had none recently. He has palpitations. He is thought to have an apical HCM variant with no evidence of apical aneursym. 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 He jogs 2-3 miles several times a week but does not compete. He does not have SAM. He has no family h/o SCD. He denies chest pain or sob.  No Known Allergies   Current Outpatient Medications  Medication Sig Dispense Refill  . carboxymethylcellulose (REFRESH PLUS) 0.5 % SOLN Place 1 drop into both eyes 2 (two) times daily as needed (dry eyes).    . Cyanocobalamin (B-12) 2500 MCG TABS Take 2,500 mcg by mouth daily.    . diclofenac sodium (VOLTAREN) 1 % GEL Apply 2 g topically 2 (two) times daily.    Marland Kitchen escitalopram (LEXAPRO) 10 MG tablet Take 10 mg by mouth daily.     Marland Kitchen gabapentin (NEURONTIN) 300 MG capsule Take 300 mg by mouth 2 (two) times daily.     Marland Kitchen ibuprofen (ADVIL) 200 MG tablet Take 200 mg by mouth at bedtime as needed for moderate pain.    . traMADol (ULTRAM) 50 MG tablet Take 100 mg by mouth 2 (two) times daily.     No current facility-administered medications for this visit.      Past Medical History:  Diagnosis Date  . Arthritis   . Depression   . Neuropathy     ROS:   All systems reviewed and negative except as noted in the HPI.   Past Surgical History:  Procedure Laterality Date  . COLONOSCOPY N/A 02/03/2019   Procedure: COLONOSCOPY;  Surgeon: Rogene Houston, MD;  Location: [facility];  Service: Endoscopy;  Laterality: N/A;  730   . TONSILLECTOMY AND ADENOIDECTOMY    . WISDOM TOOTH EXTRACTION       Family History  Problem Relation Age of Onset  . Alzheimer's disease Father   . Sudden death Neg Hx   . Hypertension Neg Hx   . Hyperlipidemia Neg Hx   . Diabetes Neg Hx   . Heart attack Neg Hx   . Colon cancer Neg Hx      Social History    Socioeconomic History  . Marital status: Married    Spouse name: Not on file  . Number of children: Not on file  . Years of education: Not on file  . Highest education level: Not on file  Occupational History  . Not on file  Social Needs  . Financial resource strain: Not on file  . Food insecurity    Worry: Not on file    Inability: Not on file  . Transportation needs    Medical: Not on file    Non-medical: Not on file  Tobacco Use  . Smoking status: Never Smoker  . Smokeless tobacco: Never Used  Substance and Sexual Activity  . Alcohol use: Yes    Alcohol/week: 14.0 standard drinks    Types: 14 Glasses of wine per week  . Drug use: No  . Sexual activity: Not on file  Lifestyle  . Physical activity    Days per week: Not on file    Minutes per session: Not on file  . Stress: Not on file  Relationships  . Social Herbalist on phone: Not on file    Gets together: Not on  file    Attends religious service: Not on file    Active member of club or organization: Not on file    Attends meetings of clubs or organizations: Not on file    Relationship status: Not on file  . Intimate partner violence    Fear of current or ex partner: Not on file    Emotionally abused: Not on file    Physically abused: Not on file    Forced sexual activity: Not on file  Other Topics Concern  . Not on file  Social History Narrative  . Not on file     BP (!) 145/79   Pulse (!) 59   Temp (!) 96.6 F (35.9 C) (Temporal)   Ht 5\' 10"  (1.778 m)   Wt 165 lb (74.8 kg)   SpO2 97%   BMI 23.68 kg/m   Physical Exam:  Well appearing NAD HEENT: Unremarkable Neck:  No JVD, no thyromegally Lymphatics:  No adenopathy Back:  No CVA tenderness Lungs:  Clear with no wheezes HEART:  Regular rate rhythm, no murmurs, no rubs, no clicks Abd:  soft, positive bowel sounds, no organomegally, no rebound, no guarding Ext:  2 plus pulses, no edema, no cyanosis, no clubbing Skin:  No rashes no  nodules Neuro:  CN II through XII intact, motor grossly intact  Assess/Plan: 1. HCM, apical variant - I discussed the risk factors for sudden death. That he had syncope over 30 years ago is not important for prognostication. He has no evidence of bp drop with exercise and has not ever had syncope associated with exercise. I have reviewed the likely good prognosis. He will undergo watchful waiting.  2. HTN - his bp was up a bit today. He notes that he was quiet nervous. I have not recommended any bp lowering unless he has repeat high bp's.   .D.

## 2019-03-23 NOTE — Patient Instructions (Signed)
Medication Instructions:  Your physician recommends that you continue on your current medications as directed. Please refer to the Current Medication list given to you today.  *If you need a refill on your cardiac medications before your next appointment, please call your pharmacy*  Lab Work: NONE  If you have labs (blood work) drawn today and your tests are completely normal, you will receive your results only by: . MyChart Message (if you have MyChart) OR . A paper copy in the mail If you have any lab test that is abnormal or we need to change your treatment, we will call you to review the results.  Testing/Procedures: NONE   Follow-Up: At [facility], you and your health needs are our priority.  As part of our continuing mission to provide you with exceptional heart care, we have created designated Provider Care Teams.  These Care Teams include your primary Cardiologist (physician) and Advanced Practice Providers (APPs -  Physician Assistants and Nurse Practitioners) who all work together to provide you with the care you need, when you need it.  Your next appointment:   As Needed   The format for your next appointment:   Either In Person or Virtual  Provider:   Gregg Taylor, MD  Other Instructions Thank you for choosing [facility]!    

## 2019-05-10 ENCOUNTER — Other Ambulatory Visit: Payer: Self-pay

## 2019-05-10 ENCOUNTER — Ambulatory Visit: Payer: BC Managed Care – PPO | Attending: Internal Medicine

## 2019-05-10 DIAGNOSIS — Z20822 Contact with and (suspected) exposure to covid-19: Secondary | ICD-10-CM

## 2019-05-11 LAB — NOVEL CORONAVIRUS, NAA: SARS-CoV-2, NAA: NOT DETECTED

## 2019-07-04 ENCOUNTER — Ambulatory Visit: Payer: BC Managed Care – PPO | Attending: Internal Medicine

## 2019-07-04 DIAGNOSIS — Z23 Encounter for immunization: Secondary | ICD-10-CM | POA: Insufficient documentation

## 2019-07-04 NOTE — Progress Notes (Signed)
   Covid-19 Vaccination Clinic  Name:  Clifford Harris    MRN: [id number] DOB: 12/11/1955  07/04/2019  Mr. Clifford Harris was observed post Covid-19 immunization for 15 minutes without incidence. He was provided with Vaccine Information Sheet and instruction to access the V-Safe system.   Mr. Clifford Harris was instructed to call 911 with any severe reactions post vaccine: Marland Kitchen Difficulty breathing  . Swelling of your face and throat  . A fast heartbeat  . A bad rash all over your body  . Dizziness and weakness    Immunizations Administered    Name Date Dose VIS Date Route   Moderna COVID-19 Vaccine 07/04/2019 11:48 AM 0.5 mL 04/06/2019 Intramuscular   Manufacturer: Moderna   Lot: [id number]   NDC: 47395-844-17

## 2019-08-07 ENCOUNTER — Ambulatory Visit: Payer: BC Managed Care – PPO | Attending: Internal Medicine

## 2019-08-07 DIAGNOSIS — Z23 Encounter for immunization: Secondary | ICD-10-CM

## 2019-08-07 NOTE — Progress Notes (Signed)
   Covid-19 Vaccination Clinic  Name:  Clifford Harris    MRN: [id number] DOB: March 27, 1956  08/07/2019  Mr. Clifford Harris was observed post Covid-19 immunization for 15 minutes without incident. He was provided with Vaccine Information Sheet and instruction to access the V-Safe system.   Mr. Clifford Harris was instructed to call 911 with any severe reactions post vaccine: Marland Kitchen Difficulty breathing  . Swelling of face and throat  . A fast heartbeat  . A bad rash all over body  . Dizziness and weakness   Immunizations Administered    Name Date Dose VIS Date Route   Moderna COVID-19 Vaccine 08/07/2019  1:38 PM 0.5 mL 04/06/2019 Intramuscular   Manufacturer: Moderna   Lot: [id number]   NDC: 71580-638-68

## 2020-01-25 DIAGNOSIS — B353 Tinea pedis: Secondary | ICD-10-CM | POA: Diagnosis not present

## 2020-02-07 ENCOUNTER — Other Ambulatory Visit: Payer: Self-pay

## 2020-02-07 DIAGNOSIS — Z20822 Contact with and (suspected) exposure to covid-19: Secondary | ICD-10-CM | POA: Diagnosis not present

## 2020-02-08 LAB — SARS-COV-2, NAA 2 DAY TAT

## 2020-02-08 LAB — NOVEL CORONAVIRUS, NAA: SARS-CoV-2, NAA: NOT DETECTED

## 2020-02-28 DIAGNOSIS — Z23 Encounter for immunization: Secondary | ICD-10-CM | POA: Diagnosis not present

## 2020-03-07 DIAGNOSIS — B355 Tinea imbricata: Secondary | ICD-10-CM | POA: Diagnosis not present

## 2020-03-07 DIAGNOSIS — L308 Other specified dermatitis: Secondary | ICD-10-CM | POA: Diagnosis not present

## 2020-03-07 DIAGNOSIS — B353 Tinea pedis: Secondary | ICD-10-CM | POA: Diagnosis not present

## 2020-03-07 DIAGNOSIS — L0100 Impetigo, unspecified: Secondary | ICD-10-CM | POA: Diagnosis not present

## 2020-05-17 DIAGNOSIS — H43811 Vitreous degeneration, right eye: Secondary | ICD-10-CM | POA: Diagnosis not present

## 2020-06-07 DIAGNOSIS — F320 Major depressive disorder, single episode, mild: Secondary | ICD-10-CM | POA: Diagnosis not present

## 2020-06-07 DIAGNOSIS — L308 Other specified dermatitis: Secondary | ICD-10-CM | POA: Diagnosis not present

## 2020-06-07 DIAGNOSIS — R7309 Other abnormal glucose: Secondary | ICD-10-CM | POA: Diagnosis not present

## 2020-06-07 DIAGNOSIS — Z6825 Body mass index (BMI) 25.0-25.9, adult: Secondary | ICD-10-CM | POA: Diagnosis not present

## 2020-06-07 DIAGNOSIS — L218 Other seborrheic dermatitis: Secondary | ICD-10-CM | POA: Diagnosis not present

## 2020-06-07 DIAGNOSIS — Z1389 Encounter for screening for other disorder: Secondary | ICD-10-CM | POA: Diagnosis not present

## 2020-06-07 DIAGNOSIS — M1991 Primary osteoarthritis, unspecified site: Secondary | ICD-10-CM | POA: Diagnosis not present

## 2020-06-07 DIAGNOSIS — F419 Anxiety disorder, unspecified: Secondary | ICD-10-CM | POA: Diagnosis not present

## 2020-06-07 DIAGNOSIS — G894 Chronic pain syndrome: Secondary | ICD-10-CM | POA: Diagnosis not present

## 2020-06-07 DIAGNOSIS — Z0000 Encounter for general adult medical examination without abnormal findings: Secondary | ICD-10-CM | POA: Diagnosis not present

## 2020-07-03 DIAGNOSIS — L258 Unspecified contact dermatitis due to other agents: Secondary | ICD-10-CM | POA: Diagnosis not present

## 2020-07-11 DIAGNOSIS — D225 Melanocytic nevi of trunk: Secondary | ICD-10-CM | POA: Diagnosis not present

## 2020-07-11 DIAGNOSIS — D485 Neoplasm of uncertain behavior of skin: Secondary | ICD-10-CM | POA: Diagnosis not present

## 2020-07-16 ENCOUNTER — Emergency Department (HOSPITAL_COMMUNITY)
Admission: EM | Admit: 2020-07-16 | Discharge: 2020-07-16 | Disposition: A | Discharge status: Home / Self Care | Payer: Medicare PPO | Attending: Emergency Medicine | Admitting: Emergency Medicine

## 2020-07-16 ENCOUNTER — Other Ambulatory Visit: Payer: Self-pay

## 2020-07-16 ENCOUNTER — Emergency Department (HOSPITAL_COMMUNITY): Payer: Medicare PPO

## 2020-07-16 ENCOUNTER — Encounter (HOSPITAL_COMMUNITY): Payer: Self-pay | Admitting: *Deleted

## 2020-07-16 DIAGNOSIS — M19012 Primary osteoarthritis, left shoulder: Secondary | ICD-10-CM | POA: Diagnosis not present

## 2020-07-16 DIAGNOSIS — S42202A Unspecified fracture of upper end of left humerus, initial encounter for closed fracture: Secondary | ICD-10-CM

## 2020-07-16 DIAGNOSIS — S42292A Other displaced fracture of upper end of left humerus, initial encounter for closed fracture: Secondary | ICD-10-CM | POA: Diagnosis not present

## 2020-07-16 DIAGNOSIS — W1839XA Other fall on same level, initial encounter: Secondary | ICD-10-CM | POA: Insufficient documentation

## 2020-07-16 DIAGNOSIS — S4992XA Unspecified injury of left shoulder and upper arm, initial encounter: Secondary | ICD-10-CM | POA: Diagnosis present

## 2020-07-16 DIAGNOSIS — S42352A Displaced comminuted fracture of shaft of humerus, left arm, initial encounter for closed fracture: Secondary | ICD-10-CM | POA: Insufficient documentation

## 2020-07-16 DIAGNOSIS — Y9302 Activity, running: Secondary | ICD-10-CM | POA: Insufficient documentation

## 2020-07-16 DIAGNOSIS — S30811A Abrasion of abdominal wall, initial encounter: Secondary | ICD-10-CM | POA: Insufficient documentation

## 2020-07-16 MED ORDER — HYDROCODONE-ACETAMINOPHEN 7.5-325 MG PO TABS: 1.0000 | ORAL_TABLET | Freq: Four times a day (QID) | ORAL | 0 refills | Status: DC | PRN

## 2020-07-16 MED ORDER — HYDROCODONE-ACETAMINOPHEN 5-325 MG PO TABS
1.0000 | ORAL_TABLET | Freq: Once | ORAL | Status: AC
  Administered 2020-07-16: 1 via ORAL
  Filled 2020-07-16: qty 1

## 2020-07-16 NOTE — ED Provider Notes (Signed)
[facility] EMERGENCY DEPARTMENT Provider Note   CSN: [id number] Arrival date & time: 07/16/20  1791     History Chief Complaint  Patient presents with  . Shoulder Injury    Clifford Harris is a 65 y.o. male.  HPI   65 year old male presents the emergency department with left shoulder injury.  Patient states that he was running this morning in the dark, he missed his footing and fell forward onto his left side.  He heard a popping sensation had immediate left shoulder pain.  Patient denies any head injury, loss of consciousness, syncope.  Denies any other pain complaints.  He has otherwise been in his usual state of health.  Past Medical History:  Diagnosis Date  . Arthritis   . Depression   . Neuropathy     Patient Active Problem List   Diagnosis Date Noted  . Special screening for malignant neoplasms, colon 06/10/2018  . Tinea pedis of left foot 01/04/2014  . Osteoarthritis of foot, left 01/04/2014  . Stiffness of joint, lower leg 06/29/2013  . Strain of hamstring muscle 06/29/2013  . Right hip pain 06/22/2013  . Sciatic nerve pain 04/11/2011  . Gait abnormality 09/26/2010  . STRESS FRACTURE, FOOT 04/24/2009  . MONONEURITIS, LEG 03/27/2009    Past Surgical History:  Procedure Laterality Date  . COLONOSCOPY N/A 02/03/2019   Procedure: COLONOSCOPY;  Surgeon: Malissa Hippo, MD;  Location[facility];  Service: Endoscopy;  Laterality: N/A;  730   . TONSILLECTOMY AND ADENOIDECTOMY    . WISDOM TOOTH EXTRACTION         Family History  Problem Relation Age of Onset  . Alzheimer's disease Father   . Sudden death Neg Hx   . Hypertension Neg Hx   . Hyperlipidemia Neg Hx   . Diabetes Neg Hx   . Heart attack Neg Hx   . Colon cancer Neg Hx     Social History   Tobacco Use  . Smoking status: Never Smoker  . Smokeless tobacco: Never Used  Vaping Use  . Vaping Use: Never used  Substance Use Topics  . Alcohol use: Yes    Alcohol/week: 14.0 standard drinks     Types: 14 Glasses of wine per week  . Drug use: No    Home Medications Prior to Admission medications   Medication Sig Start Date End Date Taking? Authorizing Provider  carboxymethylcellulose (REFRESH PLUS) 0.5 % SOLN Place 1 drop into both eyes 2 (two) times daily as needed (dry eyes).    [provider]  Cyanocobalamin (B-12) 2500 MCG TABS Take 2,500 mcg by mouth daily.    [provider]  diclofenac sodium (VOLTAREN) 1 % GEL Apply 2 g topically 2 (two) times daily.    [provider]  escitalopram (LEXAPRO) 10 MG tablet Take 10 mg by mouth daily.  09/16/16   [provider]  gabapentin (NEURONTIN) 300 MG capsule Take 300 mg by mouth 2 (two) times daily.     [provider]  ibuprofen (ADVIL) 200 MG tablet Take 200 mg by mouth at bedtime as needed for moderate pain.    [provider]  traMADol (ULTRAM) 50 MG tablet Take 100 mg by mouth 2 (two) times daily.    [provider]    Allergies    Patient has no known allergies.  Review of Systems   Review of Systems  Constitutional: Negative for chills and fever.  Respiratory: Negative for shortness of breath.   Cardiovascular:  Negative for chest pain.  Gastrointestinal: Negative for abdominal pain.  Musculoskeletal: Negative for back pain and neck pain.       + Left shoulder pain  Skin: Positive for wound.  Neurological: Negative for headaches.    Physical Exam Updated Vital Signs BP (!) 156/93 (BP Location: Right Arm)   Pulse (!) 56   Temp 97.9 F (36.6 C) (Oral)   Resp 18   Ht 5\' 10"  (1.778 m)   Wt 70.3 kg   SpO2 100%   BMI 22.24 kg/m   Physical Exam Vitals and nursing note reviewed.  Constitutional:      Appearance: Normal appearance.  HENT:     Head: Normocephalic.     Mouth/Throat:     Mouth: Mucous membranes are moist.  Cardiovascular:     Rate and Rhythm: Normal rate.  Pulmonary:     Effort: Pulmonary effort is normal. No respiratory  distress.  Abdominal:     Palpations: Abdomen is soft.     Tenderness: There is no abdominal tenderness.  Musculoskeletal:     Comments: Left shoulder is swollen, tender to touch, no obvious deformity/dislocation, left radial pulse is palpable and normal, patient has no extension of the left upper extremity  Skin:    General: Skin is warm.     Comments: Scattered superficial abrasions on the left upper extremity and left flank, no active bleeding  Neurological:     Mental Status: He is alert and oriented to person, place, and time. Mental status is at baseline.  Psychiatric:        Mood and Affect: Mood normal.     ED Results / Procedures / Treatments   Labs (all labs ordered are listed, but only abnormal results are displayed) Labs Reviewed - No data to display  EKG None  Radiology DG Shoulder Left  Result Date: 07/16/2020 CLINICAL DATA:  Pain following fall EXAM: LEFT SHOULDER - 2+ VIEW COMPARISON:  None. FINDINGS: Oblique and Y scapular images were obtained. There is a comminuted fracture along the lateral aspect of the proximal humeral head extending into the lateral proximal metaphysis. There is mild separation of fracture fragments in this area, particularly along the more superior aspect. No other fractures are evident. No dislocation. There is mild narrowing of the glenohumeral joint. There is bony overgrowth in the acromioclavicular joint region. A focus of calcification is noted just lateral to the acromion. IMPRESSION: Comminuted fracture proximal humerus involving the lateral humeral head as well as the lateral proximal metaphysis. There is separation of fracture fragments in this area along the more superior aspect. No other fractures. No dislocation. Osteoarthritic change. Probable calcific tendinosis immediately lateral to the left acromion. Electronically Signed   By: 07/18/2020 III M.D.   On: 07/16/2020 07:54   DG Humerus Left  Result Date: 07/16/2020 CLINICAL  DATA:  Pain following fall EXAM: LEFT HUMERUS - 2+ VIEW COMPARISON:  Left shoulder radiographs July 16, 2020 FINDINGS: Frontal and lateral views were obtained. Comminuted fracture of the proximal left humerus along the lateral humeral head and lateral proximal metaphysis noted, described in the shoulder report. Separation of fracture fragments noted in this area. No fracture or dislocation seen more distally. Osteoarthritic change noted in the shoulder joint. Visualized left lung clear. IMPRESSION: Comminuted fracture along the lateral aspect of the proximal humerus with mild displacement of fracture fragments. No fracture more distally. No dislocation. Osteoarthritic change in the left shoulder noted. Electronically Signed   By: July 18, 2020 III  M.D.   On: 07/16/2020 07:55    Procedures Procedures   Medications Ordered in ED Medications - No data to display  ED Course  I have reviewed the triage vital signs and the nursing notes.  Pertinent labs & imaging results that were available during my care of the patient were reviewed by me and considered in my medical decision making (see chart for details).    MDM Rules/Calculators/A&P                          66 year old male presents the emergency department mechanical fall and left shoulder injury.  X-ray shows a comminuted proximal humerus fracture.  Will consult with orthopedics and provide pain control.  X-ray shows a proximal comminuted left humerus fracture.  Consulted with Dr. Romeo Apple, orthopedic surgery.  Recommend sling for immobilization, pain control and will follow up with the patient this week for further treatment.  Patient will be discharged and treated as an outpatient.  Discharge plan and strict return to ED precautions discussed, patient verbalizes understanding and agreement.  Final Clinical Impression(s) / ED Diagnoses Final diagnoses:  None    Rx / DC Orders ED Discharge Orders    None       Rozelle Logan,  DO 07/16/20 0981

## 2020-07-16 NOTE — ED Triage Notes (Signed)
Pt c/o left shoulder injury with pain while running this morning outside. Pt reports he probably stumbled over his feet and then "took a dive" straight down. Denies hitting his head or LOC. Pt reports pain with movement to left shoulder.

## 2020-07-16 NOTE — Discharge Instructions (Addendum)
You have been seen and discharged from the emergency department.  You have sustained a left proximal comminuted humerus fracture.  Dr. Romeo Apple was consulted on your care today.  Keep the sling in place.  You may take ibuprofen for pain control.  Take stronger pain medicine as needed.  Do not mix this medicine with alcohol or other sedating medications. Do not drive or do heavy physical activity and to know how this medication affects you.  It may cause drowsiness.  Follow-up with orthopedics for reevaluation and definitive care. Take home medications as prescribed. If you have any worsening symptoms or further concerns for health please return to an emergency department for further evaluation.

## 2020-07-19 ENCOUNTER — Ambulatory Visit: Payer: Medicare PPO | Admitting: Orthopedic Surgery

## 2020-07-19 ENCOUNTER — Encounter: Payer: Self-pay | Admitting: Orthopedic Surgery

## 2020-07-19 ENCOUNTER — Other Ambulatory Visit: Payer: Self-pay

## 2020-07-19 VITALS — BP 161/84 | HR 65 | Ht 70.0 in | Wt 169.0 lb

## 2020-07-19 DIAGNOSIS — S42202A Unspecified fracture of upper end of left humerus, initial encounter for closed fracture: Secondary | ICD-10-CM

## 2020-07-19 MED ORDER — IBUPROFEN 800 MG PO TABS: 800.0000 mg | ORAL_TABLET | Freq: Three times a day (TID) | ORAL | 1 refills | Status: DC | PRN

## 2020-07-19 MED ORDER — HYDROCODONE-ACETAMINOPHEN 10-325 MG PO TABS: 1.0000 | ORAL_TABLET | ORAL | 0 refills | Status: DC | PRN

## 2020-07-19 NOTE — Progress Notes (Signed)
No chief complaint on file.   65 year old male fell injured his shoulder sustaining a fracture of the left proximal humerus 2 sets of images were obtained on 07/16/2020 in the emergency room showing a comminuted fracture of the proximal humerus with minimal displacement it does involve the greater tuberosity  ED provider notes are reviewed apparently patient was running lost his footing and fell, heard a pop in the left shoulder.  Patient was treated with a shoulder harness  Presents for evaluation and treatment Review of Systems  All other systems reviewed and are negative.   Past Medical History:  Diagnosis Date  . Arthritis   . Depression   . Neuropathy    Past Surgical History:  Procedure Laterality Date  . COLONOSCOPY N/A 02/03/2019   Procedure: COLONOSCOPY;  Surgeon: Malissa Hippo, MD;  [facility]: [facility];  Service: Endoscopy;  Laterality: N/A;  730   . TONSILLECTOMY AND ADENOIDECTOMY    . WISDOM TOOTH EXTRACTION     Ht 5\' 10"  (1.778 m)   Wt 169 lb (76.7 kg)   BMI 24.25 kg/m   Physical Exam Constitutional:      General: He is not in acute distress.    Appearance: He is well-developed.     Comments: Well developed, well nourished Normal grooming and hygiene     Cardiovascular:     Comments: No peripheral edema Skin:    General: Skin is warm and dry.  Neurological:     Mental Status: He is alert and oriented to person, place, and time.     Sensory: No sensory deficit.     Coordination: Coordination normal.     Gait: Gait normal.     Deep Tendon Reflexes: Reflexes are normal and symmetric.  Psychiatric:        Mood and Affect: Mood normal.        Behavior: Behavior normal.        Thought Content: Thought content normal.        Judgment: Judgment normal.     Comments: Affect normal     Left shoulder tender swollen some ecchymosis in the proximal portion of the humerus  Outside films show a nondisplaced greater tuberosity fracture with the fracture  line running transversely across the humerus and another fracture line exiting the lateral cortex  Recommend sling and swathe for 6 weeks  Meds ordered this encounter  Medications  . HYDROcodone-acetaminophen (NORCO) 10-325 MG tablet    Sig: Take 1 tablet by mouth every 4 (four) hours as needed.    Dispense:  42 tablet    Refill:  0  . ibuprofen (ADVIL) 800 MG tablet    Sig: Take 1 tablet (800 mg total) by mouth every 8 (eight) hours as needed.    Dispense:  90 tablet    Refill:  1    X-ray weekly for 3 weeks total

## 2020-07-27 ENCOUNTER — Encounter: Payer: Self-pay | Admitting: Orthopedic Surgery

## 2020-07-27 ENCOUNTER — Ambulatory Visit: Payer: Medicare PPO

## 2020-07-27 ENCOUNTER — Ambulatory Visit (INDEPENDENT_AMBULATORY_CARE_PROVIDER_SITE_OTHER): Payer: Medicare PPO | Admitting: Orthopedic Surgery

## 2020-07-27 ENCOUNTER — Other Ambulatory Visit: Payer: Self-pay

## 2020-07-27 DIAGNOSIS — S42295D Other nondisplaced fracture of upper end of left humerus, subsequent encounter for fracture with routine healing: Secondary | ICD-10-CM

## 2020-07-27 DIAGNOSIS — S42202A Unspecified fracture of upper end of left humerus, initial encounter for closed fracture: Secondary | ICD-10-CM | POA: Insufficient documentation

## 2020-07-27 DIAGNOSIS — S42202D Unspecified fracture of upper end of left humerus, subsequent encounter for fracture with routine healing: Secondary | ICD-10-CM | POA: Insufficient documentation

## 2020-07-27 NOTE — Patient Instructions (Signed)
Continue sling  Return in 1 week for x-ray

## 2020-07-27 NOTE — Progress Notes (Signed)
Fracture care follow-up proximal humerus fracture left shoulder  Date of injury July 16, 2020  Mechanical fall while running  65 year old male currently in a sling and swathe he is now 11 days post injury  His x-ray shows a nondisplaced comminuted proximal humerus fracture which extends into the greater tuberosity but is less than 5 mm displaced and there is no angulation in the fracture  He has no new complaints , his pain is controlled   Plan is for the patient continue in his sling and swathe come back in a week for x-ray and then we can begin some physical therapy

## 2020-08-01 DIAGNOSIS — L988 Other specified disorders of the skin and subcutaneous tissue: Secondary | ICD-10-CM | POA: Diagnosis not present

## 2020-08-01 DIAGNOSIS — D485 Neoplasm of uncertain behavior of skin: Secondary | ICD-10-CM | POA: Diagnosis not present

## 2020-08-03 ENCOUNTER — Encounter: Payer: Self-pay | Admitting: Orthopedic Surgery

## 2020-08-03 ENCOUNTER — Other Ambulatory Visit: Payer: Self-pay

## 2020-08-03 ENCOUNTER — Ambulatory Visit: Payer: Medicare PPO

## 2020-08-03 ENCOUNTER — Ambulatory Visit (INDEPENDENT_AMBULATORY_CARE_PROVIDER_SITE_OTHER): Payer: Medicare PPO | Admitting: Orthopedic Surgery

## 2020-08-03 DIAGNOSIS — S42295D Other nondisplaced fracture of upper end of left humerus, subsequent encounter for fracture with routine healing: Secondary | ICD-10-CM

## 2020-08-03 NOTE — Progress Notes (Signed)
Chief Complaint  Patient presents with  . Shoulder Injury    Still painful left shoulder     65 year old male had a proximal humerus fracture involves the greater tuberosity in the transverse portion of the surgical neck  Injury date March 13  He is 18 days post injury he is down to 1 hydrocodone at night  He can start physical therapy  He can wean himself from his shoulder immobilizer  X-rays today show the fracture is stable no displaced fragments by near criteria  Follow-up in 4 weeks

## 2020-08-10 ENCOUNTER — Telehealth: Payer: Self-pay | Admitting: Orthopedic Surgery

## 2020-08-10 DIAGNOSIS — S42295D Other nondisplaced fracture of upper end of left humerus, subsequent encounter for fracture with routine healing: Secondary | ICD-10-CM

## 2020-08-10 NOTE — Telephone Encounter (Signed)
Put in order / looks like I missed it during office visit he will get a call to schedule, I will ck on it today.

## 2020-08-10 NOTE — Telephone Encounter (Signed)
Patient said he thought he was to have therapy but hasnt heard from anyone.  Can you check on this for him and let him know what is going on?  Thanks so much

## 2020-08-14 ENCOUNTER — Ambulatory Visit (HOSPITAL_COMMUNITY): Payer: Medicare PPO | Attending: Orthopedic Surgery

## 2020-08-14 ENCOUNTER — Encounter (HOSPITAL_COMMUNITY): Payer: Self-pay

## 2020-08-14 ENCOUNTER — Other Ambulatory Visit: Payer: Self-pay

## 2020-08-14 DIAGNOSIS — M25612 Stiffness of left shoulder, not elsewhere classified: Secondary | ICD-10-CM | POA: Diagnosis not present

## 2020-08-14 DIAGNOSIS — M25512 Pain in left shoulder: Secondary | ICD-10-CM | POA: Diagnosis not present

## 2020-08-14 DIAGNOSIS — R29898 Other symptoms and signs involving the musculoskeletal system: Secondary | ICD-10-CM | POA: Diagnosis not present

## 2020-08-14 NOTE — Therapy (Signed)
[facility] [address] [place], [place], [address] Phone: [phone]   Fax:  [phone]  Occupational Therapy Evaluation  Patient Details  Name: Clifford Harris MRN: [id number] Date of Birth: Mar 16, 1956 Referring Provider (OT): Fuller Canada, MD   Encounter Date: 08/14/2020   OT End of Session - 08/14/20 1249    Visit Number 1    Number of Visits 16    Date for OT Re-Evaluation 10/09/20    Authorization Type [employer]    Authorization Time Period no visit limit    Progress Note Due on Visit 10    OT Start Time 1117    OT Stop Time 1200    OT Time Calculation (min) 43 min    Activity Tolerance Patient tolerated treatment well    Behavior During Therapy [facility] for tasks assessed/performed           Past Medical History:  Diagnosis Date  . Arthritis   . Depression   . Neuropathy     Past Surgical History:  Procedure Laterality Date  . COLONOSCOPY N/A 02/03/2019   Procedure: COLONOSCOPY;  Surgeon: Malissa Hippo, MD;  [facility];  Service: Endoscopy;  Laterality: N/A;  730   . TONSILLECTOMY AND ADENOIDECTOMY    . WISDOM TOOTH EXTRACTION      There were no vitals filed for this visit.   Subjective Assessment - 08/14/20 1122    Subjective  S: I fell running.    Pertinent History Patient is a 65 y/o male S/P left proximal humerus fracture which occurred on 3//13/22 after sustaining a fall when running. He arrived to evaluation with sling on. Dr. Romeo Apple has referred patient to occupational therapy for evaluation and treatment.    Patient Stated Goals To be able to use his left arm normal and return to being active with working out and camping.    Currently in Pain? Yes    Pain Score 7     Pain Location Shoulder    Pain Orientation Left    Pain Descriptors / Indicators Aching;Constant    Pain Type Acute pain    Pain Onset 1 to 4 weeks ago    Pain Frequency Constant    Aggravating Factors  moving  the wrong way    Pain Relieving Factors pain medication, heat    Effect of Pain on Daily Activities pt is unable to utilize his LUE for any activities.    Multiple Pain Sites No             [facility] OT Assessment - 08/14/20 1129      Assessment   Medical Diagnosis Left proximal humerus fracture    Referring Provider (OT) Fuller Canada, MD    Onset Date/Surgical Date 07/16/20    Hand Dominance Right    Next MD Visit 08/31/20    Prior Therapy yes in 2015 for his neck.      Precautions   Precautions Shoulder    Type of Shoulder Precautions standard protocol. ACZY6-0: P/ROM within pain tolerance. Week 3-5 (4/3-4/17: Begin AA/ROM if pain has deminished and it's tolerable. isometrics Week 6-8 (4/24-5/8): A/ROM Week 8-10 (5/8-5/22): light resistance strengthening    Shoulder Interventions Shoulder sling/immobilizer   may start to wean from sling at this time.     Restrictions   Weight Bearing Restrictions Yes    LUE Weight Bearing Non weight bearing      Balance Screen   Has the patient fallen in the  past 6 months Yes    How many times? 1      Home  Environment   Family/patient expects to be discharged to: Private residence      Prior Function   Level of Independence Independent    Vocation Retired    Leisure Enjoys staying active with running and working out. Camping with 2 dogs.      ADL   ADL comments Unable to utilize his LUE for any daily tasks at this time.      Mobility   Mobility Status Independent      Written Expression   Dominant Hand Left      Vision - History   Baseline Vision No visual deficits      Cognition   Overall Cognitive Status Within Functional Limits for tasks assessed      Observation/Other Assessments   Focus on Therapeutic Outcomes (FOTO)  N/A      Posture/Postural Control   Posture/Postural Control Postural limitations    Postural Limitations Rounded Shoulders;Forward head      ROM / Strength   AROM / PROM / Strength  AROM;PROM;Strength      Palpation   Palpation comment Max fascial restrictions noted in left upper arm, upper trapezius, and scapularis region.      AROM   Overall AROM  Unable to assess;Due to precautions      PROM   Overall PROM Comments Assessed supine. IR/er adducted    PROM Assessment Site Shoulder    Right/Left Shoulder Left    Left Shoulder Flexion 105 Degrees    Left Shoulder ABduction 70 Degrees    Left Shoulder Internal Rotation 90 Degrees    Left Shoulder External Rotation 20 Degrees      Strength   Overall Strength Unable to assess;Due to precautions                           OT Education - 08/14/20 1248    Education Details table slides, scapular A/ROM row and extension    Person(s) Educated Patient    Methods Explanation;Demonstration;Handout    Comprehension Verbalized understanding;Returned demonstration            OT Short Term Goals - 08/14/20 1253      OT SHORT TERM GOAL #1   Title Patient will be educated and independent with HEP in order to faciliate progress in therapy and allow him to return to using his LUE for all daily tasks.    Time 4    Period Weeks    Status New    Target Date 09/11/20      OT SHORT TERM GOAL #2   Title Patient will increase his LUE P/ROM to WNL in order to complete dressing tasks with less difficulty.    Time 4    Period Weeks    Status New      OT SHORT TERM GOAL #3   Title Patient will increase his LUE strength to 3/5 in order to complete wait level self care tasks without difficulty.    Time 4    Period Weeks    Status New      OT SHORT TERM GOAL #4   Title Patient will decrease the fascial restrictions in his left UE to mod amount in order to increase the functional mobility needed to complete low level reaching tasks.    Time 4    Period Weeks    Status New  OT SHORT TERM GOAL #5   Title Patient will report a decrease in pain in the LUE during basic ADL tasks of approximately 5/10 or  less.    Time 4    Period Weeks    Status New             OT Long Term Goals - 08/14/20 1255      OT LONG TERM GOAL #1   Title Patient will increase his LUE A/ROM to WNL while completing all high level reaching tasks as needed without difficulty.    Time 8    Period Weeks    Status New    Target Date 10/09/20      OT LONG TERM GOAL #2   Title Patient will increase his LUE strength to 5/5 in order to return to desired workout routine such as running.    Time 8    Period Weeks    Status New      OT LONG TERM GOAL #3   Title Patient will report a decrease in pain level of approximately 3/10 or less when utilizing his LUE to complete all daily tasks.    Time 8    Period Weeks    Status New      OT LONG TERM GOAL #4   Title Patient will decrease his left UE fascial restrictions to min amount or less in order to increase the functional mobility needed to complete reaching tasks.    Time 8    Period Weeks    Status New                 Plan - 08/14/20 1250    Clinical Impression Statement A: Patient is a 65 y/o male S/P left proximal humerus fracture causing increased pain, fascial restrictions and decreased ROM and strength resulting in difficulty completing daily tasks using his left UE as his nondominant extremity.    OT Occupational Profile and History Problem Focused Assessment - Including review of records relating to presenting problem    Occupational performance deficits (Please refer to evaluation for details): ADL's;Leisure;Rest and Sleep;IADL's    Body Structure / Function / Physical Skills ADL;UE functional use;Fascial restriction;Pain;ROM;Strength;Edema;Decreased knowledge of precautions;IADL    Rehab Potential Excellent    Clinical Decision Making Limited treatment options, no task modification necessary    Comorbidities Affecting Occupational Performance: None    Modification or Assistance to Complete Evaluation  No modification of tasks or assist  necessary to complete eval    OT Frequency 2x / week    OT Duration 8 weeks    OT Treatment/Interventions Self-care/ADL training;Ultrasound;Patient/family education;DME and/or AE instruction;Passive range of motion;Cryotherapy;Electrical Stimulation;Moist Heat;Neuromuscular education;Therapeutic exercise;Manual Therapy;Therapeutic activities    Plan P: Patient will benefit from skilled OT services to increase functional use of his left UE and return to completing all desired leisure and ADL tasks. Treatment plan: Follow protocol. Myofascial release, manual stretching, P/ROM, AA/ROM, A/ROM, general shoulder strengthening. Modalities PRN. Next session complete UEFI.    OT Home Exercise Plan eval: table slides, A/ROM scapular exercises    Consulted and Agree with Plan of Care Patient           Patient will benefit from skilled therapeutic intervention in order to improve the following deficits and impairments:   Body Structure / Function / Physical Skills: ADL,UE functional use,Fascial restriction,Pain,ROM,Strength,Edema,Decreased knowledge of precautions,IADL       Visit Diagnosis: Other symptoms and signs involving the musculoskeletal system - Plan: Ot plan of care  cert/re-cert  Acute pain of left shoulder - Plan: Ot plan of care cert/re-cert  Stiffness of left shoulder, not elsewhere classified - Plan: Ot plan of care cert/re-cert    Problem List Patient Active Problem List   Diagnosis Date Noted  . Closed fracture of proximal end of left humerus with routine healing 07/27/2020  . Special screening for malignant neoplasms, colon 06/10/2018  . DDD (degenerative disc disease), cervical 04/06/2015  . Tinea pedis of left foot 01/04/2014  . Osteoarthritis of foot, left 01/04/2014  . Stiffness of joint, lower leg 06/29/2013  . Strain of hamstring muscle 06/29/2013  . Right hip pain 06/22/2013  . Sciatic nerve pain 04/11/2011  . Gait abnormality 09/26/2010  . STRESS FRACTURE, FOOT  04/24/2009  . MONONEURITIS, LEG 03/27/2009    Limmie Patricia, OTR/L,CBIS  [phone]  08/14/2020, 1:22 PM  [facility] [address] [place], [place], [address] Phone: [phone]   Fax:  [phone]  Name: Clifford Harris MRN: [id number] Date of Birth: Aug 28, 1955

## 2020-08-14 NOTE — Patient Instructions (Signed)
1) SHOULDER: Flexion On Table   Place hands on towel placed on table, elbows straight. Lean forward with you upper body, pushing towel away from body.  10-15_ reps per set, _2-3__ sets per day  2) Abduction (Passive)   With arm out to side, resting on towel placed on table with palm DOWN, keeping trunk away from table, lean to the side while pushing towel away from body.  Repeat _10-15___ times. Do _2-3___ sessions per day.  Copyright  VHI. All rights reserved.     3) Internal Rotation (Assistive)   Seated with elbow bent at right angle and held against side, slide arm on table surface in an inward arc keeping elbow anchored in place. Repeat _10-15___ times. Do __2-3__ sessions per day. Activity: Use this motion to brush crumbs off the table.  Copyright  VHI. All rights reserved.   AROM SHOULDER EXTENSION  With your affected arm starting at your side, draw your arm back behind your waist. Keep your elbows straight. 10X 1-2 times a day.     Retraction   Push arms straight forward and pull back to squeeze shoulder blades together.    10X 1-2 times a day.

## 2020-08-18 ENCOUNTER — Ambulatory Visit (HOSPITAL_COMMUNITY): Payer: Medicare PPO | Admitting: Specialist

## 2020-08-18 ENCOUNTER — Other Ambulatory Visit: Payer: Self-pay

## 2020-08-18 DIAGNOSIS — R29898 Other symptoms and signs involving the musculoskeletal system: Secondary | ICD-10-CM | POA: Diagnosis not present

## 2020-08-18 DIAGNOSIS — M25612 Stiffness of left shoulder, not elsewhere classified: Secondary | ICD-10-CM

## 2020-08-18 DIAGNOSIS — M25512 Pain in left shoulder: Secondary | ICD-10-CM | POA: Diagnosis not present

## 2020-08-20 ENCOUNTER — Encounter (HOSPITAL_COMMUNITY): Payer: Self-pay | Admitting: Specialist

## 2020-08-20 NOTE — Therapy (Signed)
[facility] [address] [place], [place], [address] Phone: [phone]   Fax:  [phone]  Occupational Therapy Treatment  Patient Details  Name: Clifford Harris MRN: [id number] Date of Birth: 05-14-1955 Referring Provider (OT): Fuller Canada, MD   Encounter Date: 08/18/2020   OT End of Session - 08/20/20 2112    Visit Number 2    Number of Visits 16    Date for OT Re-Evaluation 10/09/20   mini reassess on 09/11/20   Authorization Type [employer]    Authorization Time Period 12 visits approved through 09/18/53    Authorization - Visit Number 2    Authorization - Number of Visits 12    Progress Note Due on Visit 10    OT Start Time 1520    OT Stop Time 1600    OT Time Calculation (min) 40 min    Activity Tolerance Patient tolerated treatment well    Behavior During Therapy [facility] for tasks assessed/performed           Past Medical History:  Diagnosis Date  . Arthritis   . Depression   . Neuropathy     Past Surgical History:  Procedure Laterality Date  . COLONOSCOPY N/A 02/03/2019   Procedure: COLONOSCOPY;  Surgeon: Malissa Hippo, MD;  [facility];  Service: Endoscopy;  Laterality: N/A;  730   . TONSILLECTOMY AND ADENOIDECTOMY    . WISDOM TOOTH EXTRACTION      There were no vitals filed for this visit.   Subjective Assessment - 08/20/20 2111    Subjective  S:  Its really just sore, not painful.    Pain Score 5     Pain Location Shoulder    Pain Orientation Left    Pain Descriptors / Indicators Aching    Pain Type Acute pain    Pain Onset 1 to 4 weeks ago              [facility] OT Assessment - 08/20/20 0001      Assessment   Medical Diagnosis Left proximal humerus fracture    Referring Provider (OT) Fuller Canada, MD      Precautions   Precautions Shoulder    Type of Shoulder Precautions standard protocol. TDHR4-1: P/ROM within pain tolerance. Week 3-5 (4/3-4/17: Begin AA/ROM if pain has  deminished and it's tolerable. isometrics Week 6-8 (4/24-5/8): A/ROM Week 8-10 (5/8-5/22): light resistance strengthening    Shoulder Interventions Shoulder sling/immobilizer                    OT Treatments/Exercises (OP) - 08/20/20 0001      Exercises   Exercises Shoulder      Shoulder Exercises: Supine   Protraction PROM;10 reps    Horizontal ABduction PROM;10 reps    External Rotation PROM;10 reps    Internal Rotation PROM;10 reps    Flexion PROM;10 reps    ABduction PROM;10 reps      Shoulder Exercises: Seated   Elevation AROM;10 reps    Extension AROM;10 reps    Row 10 reps;AROM      Shoulder Exercises: Therapy Ball   Flexion 10 reps    ABduction 10 reps      Manual Therapy   Manual Therapy Myofascial release    Manual therapy comments manual therapy completed seperately than all other interventions this date    Myofascial Release myofascial release and manual stretching to left upper arm, scapular, and shoulder region to decrease pain  and restrictions and improve mobility to [facility]                  OT Education - 08/20/20 2112    Education Details reviewed HEP    Methods Explanation    Comprehension Verbalized understanding            OT Short Term Goals - 08/20/20 2117      OT SHORT TERM GOAL #1   Title Patient will be educated and independent with HEP in order to faciliate progress in therapy and allow him to return to using his LUE for all daily tasks.    Time 4    Period Weeks    Status On-going    Target Date 09/11/20      OT SHORT TERM GOAL #2   Title Patient will increase his LUE P/ROM to WNL in order to complete dressing tasks with less difficulty.    Time 4    Period Weeks    Status On-going      OT SHORT TERM GOAL #3   Title Patient will increase his LUE strength to 3/5 in order to complete wait level self care tasks without difficulty.    Time 4    Period Weeks    Status On-going      OT SHORT TERM GOAL #4   Title  Patient will decrease the fascial restrictions in his left UE to mod amount in order to increase the functional mobility needed to complete low level reaching tasks.    Time 4    Period Weeks    Status On-going      OT SHORT TERM GOAL #5   Title Patient will report a decrease in pain in the LUE during basic ADL tasks of approximately 5/10 or less.    Time 4    Period Weeks    Status On-going             OT Long Term Goals - 08/20/20 2117      OT LONG TERM GOAL #1   Title Patient will increase his LUE A/ROM to WNL while completing all high level reaching tasks as needed without difficulty.    Time 8    Period Weeks    Status On-going      OT LONG TERM GOAL #2   Title Patient will increase his LUE strength to 5/5 in order to return to desired workout routine such as running.    Time 8    Period Weeks    Status On-going      OT LONG TERM GOAL #3   Title Patient will report a decrease in pain level of approximately 3/10 or less when utilizing his LUE to complete all daily tasks.    Time 8    Period Weeks    Status On-going      OT LONG TERM GOAL #4   Title Patient will decrease his left UE fascial restrictions to min amount or less in order to increase the functional mobility needed to complete reaching tasks.    Time 8    Period Weeks    Status On-going                 Plan - 08/20/20 2114    Clinical Impression Statement A:  patient able to tolerate p/rom to approximately 65% range in supine.  began a/rom elevation, depression, retraction, and table slides.    Body Structure / Function / Physical Skills ADL;UE functional use;Fascial restriction;Pain;ROM;Strength;Edema;Decreased  knowledge of precautions;IADL    Plan P:  continue to increase p/rom to [facility] in supine for improved ability to don and doff shirts independently. complete UEFI.    Consulted and Agree with Plan of Care Patient           Patient will benefit from skilled therapeutic intervention in  order to improve the following deficits and impairments:   Body Structure / Function / Physical Skills: ADL,UE functional use,Fascial restriction,Pain,ROM,Strength,Edema,Decreased knowledge of precautions,IADL       Visit Diagnosis: Other symptoms and signs involving the musculoskeletal system  Acute pain of left shoulder  Stiffness of left shoulder, not elsewhere classified    Problem List Patient Active Problem List   Diagnosis Date Noted  . Closed fracture of proximal end of left humerus with routine healing 07/27/2020  . Special screening for malignant neoplasms, colon 06/10/2018  . DDD (degenerative disc disease), cervical 04/06/2015  . Tinea pedis of left foot 01/04/2014  . Osteoarthritis of foot, left 01/04/2014  . Stiffness of joint, lower leg 06/29/2013  . Strain of hamstring muscle 06/29/2013  . Right hip pain 06/22/2013  . Sciatic nerve pain 04/11/2011  . Gait abnormality 09/26/2010  . STRESS FRACTURE, FOOT 04/24/2009  . MONONEURITIS, LEG 03/27/2009    Shirlean Mylar, MHA, OTR/L [phone]  08/20/2020, 9:23 PM  [facility] [address] [place], [place], [address] Phone: [phone]   Fax:  [phone]  Name: FLYNT BREEZE MRN: [id number] Date of Birth: 1955/11/24

## 2020-08-22 ENCOUNTER — Ambulatory Visit (HOSPITAL_COMMUNITY): Payer: Medicare PPO

## 2020-08-22 ENCOUNTER — Other Ambulatory Visit: Payer: Self-pay

## 2020-08-22 ENCOUNTER — Encounter (HOSPITAL_COMMUNITY): Payer: Self-pay

## 2020-08-22 DIAGNOSIS — M25612 Stiffness of left shoulder, not elsewhere classified: Secondary | ICD-10-CM

## 2020-08-22 DIAGNOSIS — M25512 Pain in left shoulder: Secondary | ICD-10-CM | POA: Diagnosis not present

## 2020-08-22 DIAGNOSIS — R29898 Other symptoms and signs involving the musculoskeletal system: Secondary | ICD-10-CM | POA: Diagnosis not present

## 2020-08-22 NOTE — Therapy (Signed)
[place], [place], [address] Phone: [phone]   Fax:  [phone]  Occupational Therapy Treatment  Patient Details  Name: Clifford Harris MRN: [id number] Date of Birth: 27-Jul-1955 Referring Provider (OT): Arther Abbott, MD   Encounter Date: 08/22/2020   OT End of Session - 08/22/20 0908    Visit Number 3    Number of Visits 16    Date for OT Re-Evaluation 10/09/20   mini reassess on 09/11/20   Authorization Type [employer]    Authorization Time Period 12 visits approved through 09/18/53    Authorization - Visit Number 3    Authorization - Number of Visits 12    Progress Note Due on Visit 10    OT Start Time 0900    OT Stop Time 0938    OT Time Calculation (min) 38 min    Activity Tolerance Patient tolerated treatment well    Behavior During Therapy [facility] for tasks assessed/performed           Past Medical History:  Diagnosis Date  . Arthritis   . Depression   . Neuropathy     Past Surgical History:  Procedure Laterality Date  . COLONOSCOPY N/A 02/03/2019   Procedure: COLONOSCOPY;  Surgeon: Rogene Houston, MD;  [facility];  Service: Endoscopy;  Laterality: N/A;  730   . TONSILLECTOMY AND ADENOIDECTOMY    . WISDOM TOOTH EXTRACTION      There were no vitals filed for this visit.   Subjective Assessment - 08/22/20 0905    Subjective  S: I went for a walk yesterday. My shoulder was sore afterwards.    Currently in Pain? Yes    Pain Score 7     Pain Location Shoulder    Pain Orientation Left    Pain Descriptors / Indicators Aching    Pain Type Acute pain    Pain Onset 1 to 4 weeks ago    Pain Frequency Occasional    Aggravating Factors  moving the wrong way, being in sling    Pain Relieving Factors pain medication, heat    Effect of Pain on Daily Activities Pt is unable to utilize his LUE for any activities.    Multiple Pain Sites No              [facility] OT Assessment -  08/22/20 1018      Assessment   Medical Diagnosis Left proximal humerus fracture      Precautions   Precautions Shoulder    Type of Shoulder Precautions standard protocol. [id number]: P/ROM within pain tolerance. Week 3-5 (4/3-4/17: Begin AA/ROM if pain has deminished and it's tolerable. isometrics Week 6-8 (4/24-5/8): A/ROM Week 8-10 (5/8-5/22): light resistance strengthening    Shoulder Interventions Shoulder sling/immobilizer                    OT Treatments/Exercises (OP) - 08/22/20 0923      Exercises   Exercises Shoulder      Shoulder Exercises: Supine   Protraction PROM;10 reps    Horizontal ABduction PROM;10 reps    External Rotation PROM;10 reps    Internal Rotation PROM;10 reps    Flexion PROM;10 reps    ABduction PROM;10 reps      Shoulder Exercises: Seated   Extension AROM;10 reps    Row AROM;10 reps      Shoulder Exercises: Therapy Ball   Flexion 10 reps   2" hold at end  stretch   ABduction 10 reps   2" hold at end stretch     Shoulder Exercises: ROM/Strengthening   Thumb Tacks 1' low level      Manual Therapy   Manual Therapy Myofascial release    Manual therapy comments manual therapy completed seperately than all other interventions this date    Myofascial Release myofascial release and manual stretching to left upper arm, scapular, and shoulder region to decrease pain and restrictions and improve mobility to [facility]                    OT Short Term Goals - 08/20/20 2117      OT SHORT TERM GOAL #1   Title Patient will be educated and independent with HEP in order to faciliate progress in therapy and allow him to return to using his LUE for all daily tasks.    Time 4    Period Weeks    Status On-going    Target Date 09/11/20      OT SHORT TERM GOAL #2   Title Patient will increase his LUE P/ROM to WNL in order to complete dressing tasks with less difficulty.    Time 4    Period Weeks    Status On-going      OT SHORT TERM GOAL #3    Title Patient will increase his LUE strength to 3/5 in order to complete wait level self care tasks without difficulty.    Time 4    Period Weeks    Status On-going      OT SHORT TERM GOAL #4   Title Patient will decrease the fascial restrictions in his left UE to mod amount in order to increase the functional mobility needed to complete low level reaching tasks.    Time 4    Period Weeks    Status On-going      OT SHORT TERM GOAL #5   Title Patient will report a decrease in pain in the LUE during basic ADL tasks of approximately 5/10 or less.    Time 4    Period Weeks    Status On-going             OT Long Term Goals - 08/20/20 2117      OT LONG TERM GOAL #1   Title Patient will increase his LUE A/ROM to WNL while completing all high level reaching tasks as needed without difficulty.    Time 8    Period Weeks    Status On-going      OT LONG TERM GOAL #2   Title Patient will increase his LUE strength to 5/5 in order to return to desired workout routine such as running.    Time 8    Period Weeks    Status On-going      OT LONG TERM GOAL #3   Title Patient will report a decrease in pain level of approximately 3/10 or less when utilizing his LUE to complete all daily tasks.    Time 8    Period Weeks    Status On-going      OT LONG TERM GOAL #4   Title Patient will decrease his left UE fascial restrictions to min amount or less in order to increase the functional mobility needed to complete reaching tasks.    Time 8    Period Weeks    Status On-going                 Plan -  08/22/20 1019    Clinical Impression Statement A: Focused on decreased fascial restrictions in the left UE with use of manual techniques. Pt presents with mod-max fascial restrictions in the left upper arm, upper trapezius, and scapularis region. P/ROM tolerance continues to be limited due to pain level. Added 2" hold with therapy ball stretches to help increase tolerance of movement. Added  thumb tacks exercise. VC for form and technique were provided.    Body Structure / Function / Physical Skills ADL;UE functional use;Fascial restriction;Pain;ROM;Strength;Edema;Decreased knowledge of precautions;IADL    Plan P: Supine isometrics. pro/ret/elev/dep    Consulted and Agree with Plan of Care Patient           Patient will benefit from skilled therapeutic intervention in order to improve the following deficits and impairments:   Body Structure / Function / Physical Skills: ADL,UE functional use,Fascial restriction,Pain,ROM,Strength,Edema,Decreased knowledge of precautions,IADL       Visit Diagnosis: Stiffness of left shoulder, not elsewhere classified  Acute pain of left shoulder  Other symptoms and signs involving the musculoskeletal system    Problem List Patient Active Problem List   Diagnosis Date Noted  . Closed fracture of proximal end of left humerus with routine healing 07/27/2020  . Special screening for malignant neoplasms, colon 06/10/2018  . DDD (degenerative disc disease), cervical 04/06/2015  . Tinea pedis of left foot 01/04/2014  . Osteoarthritis of foot, left 01/04/2014  . Stiffness of joint, lower leg 06/29/2013  . Strain of hamstring muscle 06/29/2013  . Right hip pain 06/22/2013  . Sciatic nerve pain 04/11/2011  . Gait abnormality 09/26/2010  . STRESS FRACTURE, FOOT 04/24/2009  . MONONEURITIS, LEG 03/27/2009    Ailene Ravel, OTR/L,CBIS  [phone]  08/22/2020, 10:22 AM  [facility] [address] [place], [place], [address] Phone: [phone]   Fax:  [phone]  Name: SHANDY VI MRN: [id number] Date of Birth: June 13, 1955

## 2020-08-23 ENCOUNTER — Ambulatory Visit (HOSPITAL_COMMUNITY): Payer: Medicare PPO

## 2020-08-23 ENCOUNTER — Encounter (HOSPITAL_COMMUNITY): Payer: Self-pay

## 2020-08-23 DIAGNOSIS — M25612 Stiffness of left shoulder, not elsewhere classified: Secondary | ICD-10-CM | POA: Diagnosis not present

## 2020-08-23 DIAGNOSIS — M25512 Pain in left shoulder: Secondary | ICD-10-CM | POA: Diagnosis not present

## 2020-08-23 DIAGNOSIS — R29898 Other symptoms and signs involving the musculoskeletal system: Secondary | ICD-10-CM

## 2020-08-24 NOTE — Therapy (Signed)
[place], [place], [address] Phone: [phone]   Fax:  [phone]  Occupational Therapy Treatment  Patient Details  Name: Clifford Harris MRN: [id number] Date of Birth: 11/27/1955 Referring Provider (OT): Arther Abbott, MD   Encounter Date: 08/23/2020   OT End of Session - 08/24/20 1452    Visit Number 4    Number of Visits 16    Date for OT Re-Evaluation 10/09/20   mini reassess on 09/11/20   Authorization Type [employer]    Authorization Time Period 12 visits approved through 09/18/53    Authorization - Visit Number 4    Authorization - Number of Visits 12    Progress Note Due on Visit 10    OT Start Time 1600    OT Stop Time 1644    OT Time Calculation (min) 44 min    Activity Tolerance Patient tolerated treatment well    Behavior During Therapy [facility] for tasks assessed/performed           Past Medical History:  Diagnosis Date  . Arthritis   . Depression   . Neuropathy     Past Surgical History:  Procedure Laterality Date  . COLONOSCOPY N/A 02/03/2019   Procedure: COLONOSCOPY;  Surgeon: Rogene Houston, MD;  [facility];  Service: Endoscopy;  Laterality: N/A;  730   . TONSILLECTOMY AND ADENOIDECTOMY    . WISDOM TOOTH EXTRACTION      There were no vitals filed for this visit.   Subjective Assessment - 08/23/20 1637    Subjective  S: My right arm is sore from moving the straw hay yesterday.    Currently in Pain? Yes    Pain Score 6     Pain Location Shoulder    Pain Orientation Left    Pain Descriptors / Indicators Aching    Pain Type Acute pain    Pain Onset 1 to 4 weeks ago    Pain Frequency Occasional    Aggravating Factors  moving the wrong way, being in sling    Pain Relieving Factors pain medication, heat    Effect of Pain on Daily Activities Pt is unable to utilize his LUE for any activities.    Multiple Pain Sites No              [facility] OT Assessment -  08/23/20 1646      Assessment   Medical Diagnosis Left proximal humerus fracture      Precautions   Precautions Shoulder    Type of Shoulder Precautions standard protocol. [id number]: P/ROM within pain tolerance. Week 3-5 (4/3-4/17: Begin AA/ROM if pain has deminished and it's tolerable. isometrics Week 6-8 (4/24-5/8): A/ROM Week 8-10 (5/8-5/22): light resistance strengthening    Shoulder Interventions Shoulder sling/immobilizer                   08/23/20 1645  Exercises  Exercises Shoulder  Shoulder Exercises: Supine  Protraction PROM;10 reps  Horizontal ABduction PROM;10 reps  External Rotation PROM;10 reps  Internal Rotation PROM;10 reps  Flexion PROM;10 reps  ABduction PROM;10 reps  Shoulder Exercises: ROM/Strengthening  Prot/Ret//Elev/Dep 1' low level with elbows bent and shoulder abducted  Shoulder Exercises: Isometric Strengthening  Flexion Supine;5X5"  Extension Supine;5X5"  External Rotation Supine;5X5"  Internal Rotation Supine;5X5"  ABduction Supine;5X5"  ADduction Supine;5X5"  Manual Therapy  Manual Therapy Myofascial release  Manual therapy comments manual therapy completed seperately than all other interventions this date  Myofascial  Release myofascial release and manual stretching to left upper arm, scapular, and shoulder region to decrease pain and restrictions and improve mobility to [facility]      OT Short Term Goals - 08/20/20 2117      OT SHORT TERM GOAL #1   Title Patient will be educated and independent with HEP in order to faciliate progress in therapy and allow him to return to using his LUE for all daily tasks.    Time 4    Period Weeks    Status On-going    Target Date 09/11/20      OT SHORT TERM GOAL #2   Title Patient will increase his LUE P/ROM to WNL in order to complete dressing tasks with less difficulty.    Time 4    Period Weeks    Status On-going      OT SHORT TERM GOAL #3   Title Patient will increase his LUE strength to 3/5 in  order to complete wait level self care tasks without difficulty.    Time 4    Period Weeks    Status On-going      OT SHORT TERM GOAL #4   Title Patient will decrease the fascial restrictions in his left UE to mod amount in order to increase the functional mobility needed to complete low level reaching tasks.    Time 4    Period Weeks    Status On-going      OT SHORT TERM GOAL #5   Title Patient will report a decrease in pain in the LUE during basic ADL tasks of approximately 5/10 or less.    Time 4    Period Weeks    Status On-going             OT Long Term Goals - 08/20/20 2117      OT LONG TERM GOAL #1   Title Patient will increase his LUE A/ROM to WNL while completing all high level reaching tasks as needed without difficulty.    Time 8    Period Weeks    Status On-going      OT LONG TERM GOAL #2   Title Patient will increase his LUE strength to 5/5 in order to return to desired workout routine such as running.    Time 8    Period Weeks    Status On-going      OT LONG TERM GOAL #3   Title Patient will report a decrease in pain level of approximately 3/10 or less when utilizing his LUE to complete all daily tasks.    Time 8    Period Weeks    Status On-going      OT LONG TERM GOAL #4   Title Patient will decrease his left UE fascial restrictions to min amount or less in order to increase the functional mobility needed to complete reaching tasks.    Time 8    Period Weeks    Status On-going                 Plan - 08/24/20 1453    Clinical Impression Statement A: Education completed regarding patient's questions on dry needling and PRP injection. Patient is wondering if both or either one of those treatments would be benefitual for his arm. Will do some research regarding PRP injection and humerus fracture recovery. Discussed what dry needling is when it be benefitual. Completed manual techniques to the left upper arm, upper trapezius, and scapularis region  to address  moderate fascial restrictions. Able to achieve slightly further P/ROM this session. Added very gentle isometric shoulder exercises with VC for form and technique provided. Added pro/elev/ret/dep at the wall with arm position modified to pain.    Body Structure / Function / Physical Skills ADL;UE functional use;Fascial restriction;Pain;ROM;Strength;Edema;Decreased knowledge of precautions;IADL    Plan P: This OT will provide any research found regarding PRP injection to next treating therapy to pass on for next session. Continue with manual techniques to address fascial restrictions. Complete gentle P/ROM to LUE while working on increasing tolerance with lower pain level. Resume therapy ball stretches.    Consulted and Agree with Plan of Care Patient           Patient will benefit from skilled therapeutic intervention in order to improve the following deficits and impairments:   Body Structure / Function / Physical Skills: ADL,UE functional use,Fascial restriction,Pain,ROM,Strength,Edema,Decreased knowledge of precautions,IADL       Visit Diagnosis: Stiffness of left shoulder, not elsewhere classified  Acute pain of left shoulder  Other symptoms and signs involving the musculoskeletal system    Problem List Patient Active Problem List   Diagnosis Date Noted  . Closed fracture of proximal end of left humerus with routine healing 07/27/2020  . Special screening for malignant neoplasms, colon 06/10/2018  . DDD (degenerative disc disease), cervical 04/06/2015  . Tinea pedis of left foot 01/04/2014  . Osteoarthritis of foot, left 01/04/2014  . Stiffness of joint, lower leg 06/29/2013  . Strain of hamstring muscle 06/29/2013  . Right hip pain 06/22/2013  . Sciatic nerve pain 04/11/2011  . Gait abnormality 09/26/2010  . STRESS FRACTURE, FOOT 04/24/2009  . MONONEURITIS, LEG 03/27/2009    Ailene Ravel, OTR/L,CBIS  [phone]  08/24/2020, 2:58 PM  [facility] [address] [place], [place], [address] Phone: [phone]   Fax:  [phone]  Name: Clifford Harris MRN: [id number] Date of Birth: June 07, 1955

## 2020-08-29 ENCOUNTER — Ambulatory Visit (HOSPITAL_COMMUNITY): Payer: Medicare PPO | Admitting: Specialist

## 2020-08-29 ENCOUNTER — Encounter (HOSPITAL_COMMUNITY): Payer: Self-pay | Admitting: Specialist

## 2020-08-29 ENCOUNTER — Other Ambulatory Visit: Payer: Self-pay

## 2020-08-29 DIAGNOSIS — M25612 Stiffness of left shoulder, not elsewhere classified: Secondary | ICD-10-CM | POA: Diagnosis not present

## 2020-08-29 DIAGNOSIS — M25512 Pain in left shoulder: Secondary | ICD-10-CM | POA: Diagnosis not present

## 2020-08-29 DIAGNOSIS — R29898 Other symptoms and signs involving the musculoskeletal system: Secondary | ICD-10-CM | POA: Diagnosis not present

## 2020-08-29 NOTE — Therapy (Signed)
[place], [place], [address] Phone: [phone]   Fax:  [phone]  Occupational Therapy Treatment  Patient Details  Name: Clifford Harris MRN: [id number] Date of Birth: 02-Feb-1956 Referring Provider (OT): Arther Abbott, MD   Encounter Date: 08/29/2020   P/ROM measurements for MD Visit:       PROM   Overall PROM Comments Assessed supine. IR/er adducted    Left Shoulder Flexion 125 Degrees   105   Left Shoulder ABduction 100 Degrees   70   Left Shoulder Internal Rotation 90 Degrees   90   Left Shoulder External Rotation 40 Degrees   20           OT End of Session - 08/29/20 1543    Visit Number 5    Number of Visits 16    Date for OT Re-Evaluation 10/09/20   mini reassess on 09/11/20   Authorization Type [employer]    Authorization Time Period 12 visits approved through 09/18/53    Authorization - Visit Number 5    Authorization - Number of Visits 12    Progress Note Due on Visit 10    OT Start Time 1430    OT Stop Time 1525    OT Time Calculation (min) 55 min    Activity Tolerance Patient tolerated treatment well    Behavior During Therapy [facility] for tasks assessed/performed           Past Medical History:  Diagnosis Date  . Arthritis   . Depression   . Neuropathy     Past Surgical History:  Procedure Laterality Date  . COLONOSCOPY N/A 02/03/2019   Procedure: COLONOSCOPY;  Surgeon: Rogene Houston, MD;  [facility];  Service: Endoscopy;  Laterality: N/A;  730   . TONSILLECTOMY AND ADENOIDECTOMY    . WISDOM TOOTH EXTRACTION      There were no vitals filed for this visit.   Subjective Assessment - 08/29/20 1542    Subjective  S:  I have about 7 treatment sessions before my camping trip. I hope I am ready.    Currently in Pain? Yes    Pain Score 3     Pain Location Shoulder    Pain Orientation Left    Pain Descriptors / Indicators Aching    Pain Type Acute pain               [facility] OT Assessment - 08/29/20 0001      Assessment   Medical Diagnosis Left proximal humerus fracture      Precautions   Precautions Shoulder    Type of Shoulder Precautions standard protocol. [id number]: P/ROM within pain tolerance. Week 3-5 (4/3-4/17: Begin AA/ROM if pain has deminished and it's tolerable. isometrics Week 6-8 (4/24-5/8): A/ROM Week 8-10 (5/8-5/22): light resistance strengthening    Shoulder Interventions Shoulder sling/immobilizer      PROM   Overall PROM Comments Assessed supine. IR/er adducted    Left Shoulder Flexion 125 Degrees   105   Left Shoulder ABduction 100 Degrees   70   Left Shoulder Internal Rotation 90 Degrees   90   Left Shoulder External Rotation 40 Degrees   20                   OT Treatments/Exercises (OP) - 08/29/20 0001      Shoulder Exercises: Supine   Protraction PROM;AAROM;10 reps    Horizontal ABduction PROM;AAROM;10 reps  External Rotation PROM;AAROM;10 reps    Internal Rotation PROM;AAROM;10 reps    Flexion PROM;AAROM;10 reps    ABduction PROM;AAROM;10 reps      Shoulder Exercises: Seated   Elevation AROM;10 reps    Extension AROM;10 reps    Row AROM;10 reps      Shoulder Exercises: Therapy Ball   Flexion 20 reps    ABduction 20 reps    ABduction Limitations tactile cues for proper positioning and to prolong stretch at end range      Manual Therapy   Manual Therapy Myofascial release    Manual therapy comments manual therapy completed seperately than all other interventions this date    Myofascial Release myofascial release and manual stretching to left upper arm, scapular, and shoulder region to decrease pain and restrictions and improve mobility to [facility]                  OT Education - 08/29/20 1545    Education Details reviewed PRP injection findings for proximal humerus fractures, it has proven to shorten recovery time and does not affect overall improvement during recovery.    Person(s)  Educated Patient    Methods Explanation    Comprehension Verbalized understanding            OT Short Term Goals - 08/20/20 2117      OT SHORT TERM GOAL #1   Title Patient will be educated and independent with HEP in order to faciliate progress in therapy and allow him to return to using his LUE for all daily tasks.    Time 4    Period Weeks    Status On-going    Target Date 09/11/20      OT SHORT TERM GOAL #2   Title Patient will increase his LUE P/ROM to WNL in order to complete dressing tasks with less difficulty.    Time 4    Period Weeks    Status On-going      OT SHORT TERM GOAL #3   Title Patient will increase his LUE strength to 3/5 in order to complete wait level self care tasks without difficulty.    Time 4    Period Weeks    Status On-going      OT SHORT TERM GOAL #4   Title Patient will decrease the fascial restrictions in his left UE to mod amount in order to increase the functional mobility needed to complete low level reaching tasks.    Time 4    Period Weeks    Status On-going      OT SHORT TERM GOAL #5   Title Patient will report a decrease in pain in the LUE during basic ADL tasks of approximately 5/10 or less.    Time 4    Period Weeks    Status On-going             OT Long Term Goals - 08/20/20 2117      OT LONG TERM GOAL #1   Title Patient will increase his LUE A/ROM to WNL while completing all high level reaching tasks as needed without difficulty.    Time 8    Period Weeks    Status On-going      OT LONG TERM GOAL #2   Title Patient will increase his LUE strength to 5/5 in order to return to desired workout routine such as running.    Time 8    Period Weeks    Status On-going  OT LONG TERM GOAL #3   Title Patient will report a decrease in pain level of approximately 3/10 or less when utilizing his LUE to complete all daily tasks.    Time 8    Period Weeks    Status On-going      OT LONG TERM GOAL #4   Title Patient will  decrease his left UE fascial restrictions to min amount or less in order to increase the functional mobility needed to complete reaching tasks.    Time 8    Period Weeks    Status On-going                 Plan - 08/29/20 1544    Clinical Impression Statement A:  Patient has had significant improvements in P/ROM this date.  Initiated aa/rom in supine with mod verbal guidance for technique.  assess humeral-scapular rhythm with aa/rom flexion in seated with good ratio of rhythm present.    Body Structure / Function / Physical Skills ADL;UE functional use;Fascial restriction;Pain;ROM;Strength;Edema;Decreased knowledge of precautions;IADL    Plan P:  continue to improve p/rom and aa/rom in supine, add thumb tacks, wall wash, and prot/ret//elev, dep.           Patient will benefit from skilled therapeutic intervention in order to improve the following deficits and impairments:   Body Structure / Function / Physical Skills: ADL,UE functional use,Fascial restriction,Pain,ROM,Strength,Edema,Decreased knowledge of precautions,IADL       Visit Diagnosis: Stiffness of left shoulder, not elsewhere classified  Acute pain of left shoulder  Other symptoms and signs involving the musculoskeletal system    Problem List Patient Active Problem List   Diagnosis Date Noted  . Closed fracture of proximal end of left humerus with routine healing 07/27/2020  . Special screening for malignant neoplasms, colon 06/10/2018  . DDD (degenerative disc disease), cervical 04/06/2015  . Tinea pedis of left foot 01/04/2014  . Osteoarthritis of foot, left 01/04/2014  . Stiffness of joint, lower leg 06/29/2013  . Strain of hamstring muscle 06/29/2013  . Right hip pain 06/22/2013  . Sciatic nerve pain 04/11/2011  . Gait abnormality 09/26/2010  . STRESS FRACTURE, FOOT 04/24/2009  . MONONEURITIS, LEG 03/27/2009   Vangie Bicker, [place], OTR/L [phone]  08/29/2020, 3:49 PM  [facility] [address] [place], [place], [address] Phone: [phone]   Fax:  [phone]  Name: Clifford Harris MRN: [id number] Date of Birth: June 22, 1955

## 2020-08-31 ENCOUNTER — Ambulatory Visit: Payer: Medicare PPO | Admitting: Orthopedic Surgery

## 2020-08-31 ENCOUNTER — Other Ambulatory Visit: Payer: Self-pay

## 2020-08-31 ENCOUNTER — Ambulatory Visit: Payer: Medicare PPO

## 2020-08-31 ENCOUNTER — Encounter: Payer: Self-pay | Admitting: Orthopedic Surgery

## 2020-08-31 VITALS — Ht 70.0 in | Wt 169.0 lb

## 2020-08-31 DIAGNOSIS — S42295D Other nondisplaced fracture of upper end of left humerus, subsequent encounter for fracture with routine healing: Secondary | ICD-10-CM

## 2020-08-31 NOTE — Progress Notes (Signed)
Chief Complaint  Patient presents with  . Shoulder Injury    Lt shoulder pain DOI 07/16/20    6 weeks post injury   Fracture care  6 weeks after left proximal humerus fracture comminuted but not displaced  PT notes indicate significant improvements in abduction and flexion  He can remove the sling is okay to drive is okay to go on his camping trip no running yet  Continue therapy follow-up in 6 weeks

## 2020-09-01 ENCOUNTER — Ambulatory Visit (HOSPITAL_COMMUNITY): Payer: Medicare PPO | Admitting: Specialist

## 2020-09-01 DIAGNOSIS — M25612 Stiffness of left shoulder, not elsewhere classified: Secondary | ICD-10-CM

## 2020-09-01 DIAGNOSIS — R29898 Other symptoms and signs involving the musculoskeletal system: Secondary | ICD-10-CM

## 2020-09-01 DIAGNOSIS — M25512 Pain in left shoulder: Secondary | ICD-10-CM

## 2020-09-01 NOTE — Patient Instructions (Signed)

## 2020-09-02 ENCOUNTER — Encounter (HOSPITAL_COMMUNITY): Payer: Self-pay | Admitting: Specialist

## 2020-09-02 NOTE — Therapy (Signed)
[place], [place], [address] Phone: [phone]   Fax:  [phone]  Occupational Therapy Treatment  Patient Details  Name: Clifford Harris MRN: [id number] Date of Birth: [birth year] Referring Provider (OT): Arther Abbott, MD   Encounter Date: 09/01/2020   OT End of Session - 09/02/20 0924    Visit Number 6    Number of Visits 16    Date for OT Re-Evaluation 10/09/20   mini reassess on 09/11/20   Authorization Type [employer]    Authorization Time Period 12 visits approved through 09/18/53    Authorization - Visit Number 6    Authorization - Number of Visits 12    Progress Note Due on Visit 10    OT Start Time 1520    OT Stop Time 1600    OT Time Calculation (min) 40 min    Activity Tolerance Patient tolerated treatment well    Behavior During Therapy [facility] for tasks assessed/performed           Past Medical History:  Diagnosis Date  . Arthritis   . Depression   . Neuropathy     Past Surgical History:  Procedure Laterality Date  . COLONOSCOPY N/A 02/03/2019   Procedure: COLONOSCOPY;  Surgeon: Rogene Houston, MD;  [facility];  Service: Endoscopy;  Laterality: N/A;  730   . TONSILLECTOMY AND ADENOIDECTOMY    . WISDOM TOOTH EXTRACTION      There were no vitals filed for this visit.   Subjective Assessment - 09/02/20 0923    Subjective  S:  I dont have to wear my sling anymore.    Currently in Pain? Yes    Pain Score 3     Pain Location Shoulder    Pain Orientation Left              [facility] OT Assessment - 09/02/20 0001      Assessment   Medical Diagnosis Left proximal humerus fracture    Referring Provider (OT) Arther Abbott, MD    Onset Date/Surgical Date 07/16/20      Precautions   Precautions Shoulder    Type of Shoulder Precautions standard protocol. [id number]: P/ROM within pain tolerance. Week 3-5 (4/3-4/17: Begin AA/ROM if pain has deminished and it's tolerable.  isometrics Week 6-8 (4/24-5/8): A/ROM Week 8-10 (5/8-5/22): light resistance strengthening    Shoulder Interventions Shoulder sling/immobilizer                    OT Treatments/Exercises (OP) - 09/02/20 0001      Exercises   Exercises Shoulder      Shoulder Exercises: Supine   Protraction PROM;AAROM;10 reps    Horizontal ABduction PROM;AAROM;10 reps    External Rotation PROM;AAROM;10 reps    Internal Rotation PROM;AAROM;10 reps    Flexion PROM;AAROM;10 reps    ABduction PROM;AAROM;10 reps      Shoulder Exercises: Seated   Elevation AROM;10 reps    Extension AROM;10 reps    Row AROM;10 reps      Shoulder Exercises: ROM/Strengthening   Wall Wash 1' low level with cuing for squaring shoulders to doorway    Thumb Tacks 1' low level    Prot/Ret//Elev/Dep 1' with cuing for technique and sequencing      Manual Therapy   Manual Therapy Myofascial release    Manual therapy comments manual therapy completed seperately than all other interventions this date    Myofascial Release myofascial release and manual  stretching to left upper arm, scapular, and shoulder region to decrease pain and restrictions and improve mobility to [place] Education - 09/02/20 0924    Education Details educated patient on aa/rom in supine for shoulder    Person(s) Educated Patient    Methods Explanation;Demonstration;Handout    Comprehension Verbalized understanding            OT Short Term Goals - 08/20/20 2117      OT SHORT TERM GOAL #1   Title Patient will be educated and independent with HEP in order to faciliate progress in therapy and allow him to return to using his LUE for all daily tasks.    Time 4    Period Weeks    Status On-going    Target Date 09/11/20      OT SHORT TERM GOAL #2   Title Patient will increase his LUE P/ROM to WNL in order to complete dressing tasks with less difficulty.    Time 4    Period Weeks    Status On-going      OT SHORT  TERM GOAL #3   Title Patient will increase his LUE strength to 3/5 in order to complete wait level self care tasks without difficulty.    Time 4    Period Weeks    Status On-going      OT SHORT TERM GOAL #4   Title Patient will decrease the fascial restrictions in his left UE to mod amount in order to increase the functional mobility needed to complete low level reaching tasks.    Time 4    Period Weeks    Status On-going      OT SHORT TERM GOAL #5   Title Patient will report a decrease in pain in the LUE during basic ADL tasks of approximately 5/10 or less.    Time 4    Period Weeks    Status On-going             OT Long Term Goals - 08/20/20 2117      OT LONG TERM GOAL #1   Title Patient will increase his LUE A/ROM to WNL while completing all high level reaching tasks as needed without difficulty.    Time 8    Period Weeks    Status On-going      OT LONG TERM GOAL #2   Title Patient will increase his LUE strength to 5/5 in order to return to desired workout routine such as running.    Time 8    Period Weeks    Status On-going      OT LONG TERM GOAL #3   Title Patient will report a decrease in pain level of approximately 3/10 or less when utilizing his LUE to complete all daily tasks.    Time 8    Period Weeks    Status On-going      OT LONG TERM GOAL #4   Title Patient will decrease his left UE fascial restrictions to min amount or less in order to increase the functional mobility needed to complete reaching tasks.    Time 8    Period Weeks    Status On-going                 Plan - 09/02/20 0981    Clinical Impression Statement A:  per md and protocol, sling discontinued.  treatment focused on  improving p/rom in supine as well as aa/rom in supine and standing in prep for a/rom.  educated patient on hep for aa/rom in supine.    Body Structure / Function / Physical Skills ADL;UE functional use;Fascial restriction;Pain;ROM;Strength;Edema;Decreased knowledge  of precautions;IADL    Plan P:  follow up on HEP, increase aa/rom repetitions to 12 in supine, add ball circles and pulleys for improved aa/rom needed for improved independence with adls.           Patient will benefit from skilled therapeutic intervention in order to improve the following deficits and impairments:   Body Structure / Function / Physical Skills: ADL,UE functional use,Fascial restriction,Pain,ROM,Strength,Edema,Decreased knowledge of precautions,IADL       Visit Diagnosis: Stiffness of left shoulder, not elsewhere classified  Acute pain of left shoulder  Other symptoms and signs involving the musculoskeletal system    Problem List Patient Active Problem List   Diagnosis Date Noted  . Closed fracture of proximal end of left humerus with routine healing 07/27/2020  . Special screening for malignant neoplasms, colon 06/10/2018  . DDD (degenerative disc disease), cervical 04/06/2015  . Tinea pedis of left foot 01/04/2014  . Osteoarthritis of foot, left 01/04/2014  . Stiffness of joint, lower leg 06/29/2013  . Strain of hamstring muscle 06/29/2013  . Right hip pain 06/22/2013  . Sciatic nerve pain 04/11/2011  . Gait abnormality 09/26/2010  . STRESS FRACTURE, FOOT 04/24/2009  . MONONEURITIS, LEG 03/27/2009    Vangie Bicker, [place], OTR/L [phone]  09/02/2020, 9:31 AM  [facility] [address] [place], [place], [address] Phone: [phone]   Fax:  [phone]  Name: LORNE WINKELS MRN: [id number] Date of Birth: 03/15/1956

## 2020-09-06 ENCOUNTER — Other Ambulatory Visit: Payer: Self-pay

## 2020-09-06 ENCOUNTER — Telehealth (HOSPITAL_COMMUNITY): Payer: Self-pay | Admitting: Occupational Therapy

## 2020-09-06 ENCOUNTER — Ambulatory Visit (HOSPITAL_COMMUNITY): Payer: Medicare PPO | Attending: Orthopedic Surgery | Admitting: Occupational Therapy

## 2020-09-06 ENCOUNTER — Encounter (HOSPITAL_COMMUNITY): Payer: Self-pay | Admitting: Occupational Therapy

## 2020-09-06 DIAGNOSIS — M25612 Stiffness of left shoulder, not elsewhere classified: Secondary | ICD-10-CM | POA: Diagnosis not present

## 2020-09-06 DIAGNOSIS — R29898 Other symptoms and signs involving the musculoskeletal system: Secondary | ICD-10-CM | POA: Diagnosis not present

## 2020-09-06 DIAGNOSIS — M25512 Pain in left shoulder: Secondary | ICD-10-CM | POA: Insufficient documentation

## 2020-09-06 NOTE — Telephone Encounter (Signed)
Left a message on pt mobile phone about Friday's appointment being at 2:30 PM not at 1 PM.   Danie Chandler OT, MOT

## 2020-09-06 NOTE — Therapy (Signed)
[facility] [address] [place], [place], [address] Phone: [phone]   Fax:  [phone]  Occupational Therapy Treatment  Patient Details  Name: Clifford Harris MRN: [id number] Date of Birth: Jan 17, 1956 Referring Provider (OT): Fuller Canada, MD   Encounter Date: 09/06/2020   OT End of Session - 09/06/20 1358    Visit Number 7    Number of Visits 16    Date for OT Re-Evaluation 10/09/20   mini reassess on 09/11/20   Authorization Type [employer]    Authorization Time Period 12 visits approved through 09/18/53    Authorization - Visit Number 7    Authorization - Number of Visits 12    Progress Note Due on Visit 10    OT Start Time 1301    OT Stop Time 1348    OT Time Calculation (min) 47 min    Activity Tolerance Patient tolerated treatment well    Behavior During Therapy [facility] for tasks assessed/performed           Past Medical History:  Diagnosis Date  . Arthritis   . Depression   . Neuropathy     Past Surgical History:  Procedure Laterality Date  . COLONOSCOPY N/A 02/03/2019   Procedure: COLONOSCOPY;  Surgeon: Malissa Hippo, MD;  [facility];  Service: Endoscopy;  Laterality: N/A;  730   . TONSILLECTOMY AND ADENOIDECTOMY    . WISDOM TOOTH EXTRACTION      There were no vitals filed for this visit.   Subjective Assessment - 09/06/20 1300    Subjective  S: I am not sleeping well. I am probably going to cancel. I am not doing the exercises at home anymore.    Currently in Pain? Yes    Pain Score 8     Pain Location Shoulder    Pain Orientation Left    Pain Descriptors / Indicators Aching    Pain Type Acute pain    Pain Onset 1 to 4 weeks ago    Pain Frequency Constant    Aggravating Factors  hurts even at rest    Pain Relieving Factors nothing is helping    Effect of Pain on Daily Activities Pt is unable to use LUE for any activities.    Multiple Pain Sites No              [facility] OT  Assessment - 09/06/20 1300      Assessment   Medical Diagnosis Left proximal humerus fracture    Referring Provider (OT) Fuller Canada, MD    Onset Date/Surgical Date 07/16/20      Precautions   Precautions Shoulder    Type of Shoulder Precautions standard protocol. [id number]: P/ROM within pain tolerance. Week 3-5 (4/3-4/17: Begin AA/ROM if pain has deminished and it's tolerable. isometrics Week 6-8 (4/24-5/8): A/ROM Week 8-10 (5/8-5/22): light resistance strengthening    Shoulder Interventions --                    OT Treatments/Exercises (OP) - 09/06/20 0001      Exercises   Exercises Shoulder      Shoulder Exercises: Supine   Protraction PROM;AAROM;10 reps    Horizontal ABduction PROM;AAROM;10 reps    External Rotation PROM;AAROM;10 reps    Internal Rotation PROM;AAROM;10 reps    Flexion PROM;AAROM;10 reps    ABduction PROM;AAROM;10 reps      Shoulder Exercises: Pulleys   Flexion --   x10 with tactile cuing  ABduction --   x10 with tactile cuing     Manual Therapy   Manual Therapy Myofascial release    Manual therapy comments manual therapy completed seperately than all other interventions this date    Myofascial Release myofascial release and manual stretching to left upper arm, scapular, and shoulder region to decrease pain and restrictions and improve mobility to [facility]                  OT Education - 09/06/20 1357    Education Details educated patient on purpose of P/ROM and AA/ROM and the importance of doing the home programs provided.    Person(s) Educated Patient    Methods Explanation    Comprehension Verbalized understanding            OT Short Term Goals - 08/20/20 2117      OT SHORT TERM GOAL #1   Title Patient will be educated and independent with HEP in order to faciliate progress in therapy and allow him to return to using his LUE for all daily tasks.    Time 4    Period Weeks    Status On-going    Target Date 09/11/20      OT  SHORT TERM GOAL #2   Title Patient will increase his LUE P/ROM to WNL in order to complete dressing tasks with less difficulty.    Time 4    Period Weeks    Status On-going      OT SHORT TERM GOAL #3   Title Patient will increase his LUE strength to 3/5 in order to complete wait level self care tasks without difficulty.    Time 4    Period Weeks    Status On-going      OT SHORT TERM GOAL #4   Title Patient will decrease the fascial restrictions in his left UE to mod amount in order to increase the functional mobility needed to complete low level reaching tasks.    Time 4    Period Weeks    Status On-going      OT SHORT TERM GOAL #5   Title Patient will report a decrease in pain in the LUE during basic ADL tasks of approximately 5/10 or less.    Time 4    Period Weeks    Status On-going             OT Long Term Goals - 08/20/20 2117      OT LONG TERM GOAL #1   Title Patient will increase his LUE A/ROM to WNL while completing all high level reaching tasks as needed without difficulty.    Time 8    Period Weeks    Status On-going      OT LONG TERM GOAL #2   Title Patient will increase his LUE strength to 5/5 in order to return to desired workout routine such as running.    Time 8    Period Weeks    Status On-going      OT LONG TERM GOAL #3   Title Patient will report a decrease in pain level of approximately 3/10 or less when utilizing his LUE to complete all daily tasks.    Time 8    Period Weeks    Status On-going      OT LONG TERM GOAL #4   Title Patient will decrease his left UE fascial restrictions to min amount or less in order to increase the functional mobility needed to complete reaching  tasks.    Time 8    Period Weeks    Status On-going                 Plan - 09/06/20 1359    Clinical Impression Statement A: Pt no longer donning sling as noted last session. Pt arrived this date reporting 8/10 pain with significant frustration with his perceived  lack of progress. At the start of session pt reported that he likely would not come Friday and would cancel future treatments, but by the end of session he reported he would do his best to come Friday. Pt also reported he stopped doing his home exercise programs. With education on the process of rehab and the benefit of completing home programs, pt appeared to soften and be less frustrated. P/ROM completed in supine with much care due to pt increase in pain. Pt able to tolerate x10 reps of pully exercises for flexion and abduction as well as supine A/ROM with frequent cuing for form and technique. Per pt request this therapist sent a message to the referring provider about the pt's interest in pain medication due to significant pain in L UE.    OT Treatment/Interventions Self-care/ADL training;Ultrasound;Patient/family education;DME and/or AE instruction;Passive range of motion;Cryotherapy;Electrical Stimulation;Moist Heat;Neuromuscular education;Therapeutic exercise;Manual Therapy;Therapeutic activities    Plan P: Continue to encourage pt to complete HEPs. Complete ball rolls and continue Myofascial realse, P/ROM and A/AROM in supine. Continue Pully exercises. Continue to educate on the therapy process. Possibly attempt A/AROM in sitting.    OT Home Exercise Plan eval: table slides, A/ROM scapular exercises    Consulted and Agree with Plan of Care Patient           Patient will benefit from skilled therapeutic intervention in order to improve the following deficits and impairments:           Visit Diagnosis: Stiffness of left shoulder, not elsewhere classified  Acute pain of left shoulder  Other symptoms and signs involving the musculoskeletal system    Problem List Patient Active Problem List   Diagnosis Date Noted  . Closed fracture of proximal end of left humerus with routine healing 07/27/2020  . Special screening for malignant neoplasms, colon 06/10/2018  . DDD (degenerative disc  disease), cervical 04/06/2015  . Tinea pedis of left foot 01/04/2014  . Osteoarthritis of foot, left 01/04/2014  . Stiffness of joint, lower leg 06/29/2013  . Strain of hamstring muscle 06/29/2013  . Right hip pain 06/22/2013  . Sciatic nerve pain 04/11/2011  . Gait abnormality 09/26/2010  . STRESS FRACTURE, FOOT 04/24/2009  . MONONEURITIS, LEG 03/27/2009   Danie Chandler OT, MOT  Danie Chandler 09/06/2020, 2:08 PM  [facility] [address] [place], [place], [address] Phone: [phone]   Fax:  [phone]  Name: Clifford Harris MRN: [id number] Date of Birth: [birth year]

## 2020-09-08 ENCOUNTER — Encounter (HOSPITAL_COMMUNITY): Payer: Self-pay | Admitting: Occupational Therapy

## 2020-09-08 ENCOUNTER — Ambulatory Visit (HOSPITAL_COMMUNITY): Payer: Medicare PPO | Admitting: Occupational Therapy

## 2020-09-08 ENCOUNTER — Other Ambulatory Visit: Payer: Self-pay

## 2020-09-08 DIAGNOSIS — R29898 Other symptoms and signs involving the musculoskeletal system: Secondary | ICD-10-CM | POA: Diagnosis not present

## 2020-09-08 DIAGNOSIS — M25612 Stiffness of left shoulder, not elsewhere classified: Secondary | ICD-10-CM | POA: Diagnosis not present

## 2020-09-08 DIAGNOSIS — M25512 Pain in left shoulder: Secondary | ICD-10-CM | POA: Diagnosis not present

## 2020-09-08 NOTE — Therapy (Signed)
[facility] [address] [place], [place], [address] Phone: [phone]   Fax:  [phone]  Occupational Therapy Treatment  Patient Details  Name: Clifford Harris MRN: [id number] Date of Birth: 1955/06/23 Referring Provider (OT): Fuller Canada, MD   Encounter Date: 09/08/2020   OT End of Session - 09/08/20 1602    Visit Number 8    Number of Visits 16    Date for OT Re-Evaluation 10/09/20   mini reassess on 09/11/20   Authorization Type [employer]    Authorization Time Period 12 visits approved through 09/18/53    Authorization - Visit Number 8    Authorization - Number of Visits 12    Progress Note Due on Visit 10    OT Start Time 1434    OT Stop Time 1515    OT Time Calculation (min) 41 min    Activity Tolerance Patient tolerated treatment well    Behavior During Therapy [facility] for tasks assessed/performed           Past Medical History:  Diagnosis Date  . Arthritis   . Depression   . Neuropathy     Past Surgical History:  Procedure Laterality Date  . COLONOSCOPY N/A 02/03/2019   Procedure: COLONOSCOPY;  Surgeon: Malissa Hippo, MD;  [facility];  Service: Endoscopy;  Laterality: N/A;  730   . TONSILLECTOMY AND ADENOIDECTOMY    . WISDOM TOOTH EXTRACTION      There were no vitals filed for this visit.   Subjective Assessment - 09/08/20 1434    Subjective  S: Somthing formed on my L shoulder blade and I need you to check it out.    Currently in Pain? Yes    Pain Score 8     Pain Location Shoulder    Pain Orientation Left    Pain Descriptors / Indicators Aching    Pain Type Acute pain    Pain Onset 1 to 4 weeks ago    Pain Frequency Constant    Aggravating Factors  hurts even at rest    Pain Relieving Factors nothing is helping still; medication partially helps    Effect of Pain on Daily Activities Pt is unable to use L UE for any activities.    Multiple Pain Sites No              [facility] OT  Assessment - 09/08/20 0001      Assessment   Medical Diagnosis Left proximal humerus fracture    Referring Provider (OT) Fuller Canada, MD    Onset Date/Surgical Date 07/16/20      Precautions   Precautions Shoulder    Type of Shoulder Precautions standard protocol. [id number]: P/ROM within pain tolerance. Week 3-5 (4/3-4/17: Begin AA/ROM if pain has deminished and it's tolerable. isometrics Week 6-8 (4/24-5/8): A/ROM Week 8-10 (5/8-5/22): light resistance strengthening                    OT Treatments/Exercises (OP) - 09/08/20 0001      Exercises   Exercises Shoulder      Shoulder Exercises: Supine   Protraction PROM;AAROM;10 reps;20 reps    Horizontal ABduction PROM;AAROM;10 reps    External Rotation PROM;AAROM;10 reps    Internal Rotation PROM;AAROM;10 reps    Flexion PROM;AAROM;10 reps    ABduction PROM;AAROM;10 reps      Shoulder Exercises: Pulleys   Flexion 1 minute    ABduction 1 minute  Shoulder Exercises: ROM/Strengthening   Other ROM/Strengthening Exercises theraball roll x10 with 3 second hold flexion and abduction      Manual Therapy   Manual Therapy Myofascial release    Manual therapy comments manual therapy completed seperately than all other interventions this date    Myofascial Release myofascial release and manual stretching to left upper arm, scapular, and shoulder region to decrease pain and restrictions and improve mobility to [facility]                  OT Education - 09/08/20 1602    Education Details Pt encouraged to complete HEP exercises 2 to 3 times a day.    Person(s) Educated Patient    Methods Explanation    Comprehension Verbalized understanding            OT Short Term Goals - 08/20/20 2117      OT SHORT TERM GOAL #1   Title Patient will be educated and independent with HEP in order to faciliate progress in therapy and allow him to return to using his LUE for all daily tasks.    Time 4    Period Weeks    Status  On-going    Target Date 09/11/20      OT SHORT TERM GOAL #2   Title Patient will increase his LUE P/ROM to WNL in order to complete dressing tasks with less difficulty.    Time 4    Period Weeks    Status On-going      OT SHORT TERM GOAL #3   Title Patient will increase his LUE strength to 3/5 in order to complete wait level self care tasks without difficulty.    Time 4    Period Weeks    Status On-going      OT SHORT TERM GOAL #4   Title Patient will decrease the fascial restrictions in his left UE to mod amount in order to increase the functional mobility needed to complete low level reaching tasks.    Time 4    Period Weeks    Status On-going      OT SHORT TERM GOAL #5   Title Patient will report a decrease in pain in the LUE during basic ADL tasks of approximately 5/10 or less.    Time 4    Period Weeks    Status On-going             OT Long Term Goals - 08/20/20 2117      OT LONG TERM GOAL #1   Title Patient will increase his LUE A/ROM to WNL while completing all high level reaching tasks as needed without difficulty.    Time 8    Period Weeks    Status On-going      OT LONG TERM GOAL #2   Title Patient will increase his LUE strength to 5/5 in order to return to desired workout routine such as running.    Time 8    Period Weeks    Status On-going      OT LONG TERM GOAL #3   Title Patient will report a decrease in pain level of approximately 3/10 or less when utilizing his LUE to complete all daily tasks.    Time 8    Period Weeks    Status On-going      OT LONG TERM GOAL #4   Title Patient will decrease his left UE fascial restrictions to min amount or less in order to increase the  functional mobility needed to complete reaching tasks.    Time 8    Period Weeks    Status On-going                 Plan - 09/08/20 1603    Clinical Impression Statement A: Pt demonstrating mild insreased range in P/ROM and A/ROM. Pt less agitated about pain this  date despite 8/10 report as it was in the previous session. Pt able to complete ball stretches seated and pully exercises again this date. Pt encouraged to complete exercises 2 to 3 times a day. Pt seemed to report he completed HEP exercises once since last visit. Lack of engagement with HEP likely is impacting pace of recovery. Pt mildly receptive to encouragement to complete exercises daily. Pt also concerned about dense muscle/scar tissue area in L anterior shoulder.    Body Structure / Function / Physical Skills ADL;UE functional use;Fascial restriction;Pain;ROM;Strength;Edema;Decreased knowledge of precautions;IADL    OT Treatment/Interventions Self-care/ADL training;Ultrasound;Patient/family education;DME and/or AE instruction;Passive range of motion;Cryotherapy;Electrical Stimulation;Moist Heat;Neuromuscular education;Therapeutic exercise;Manual Therapy;Therapeutic activities    Plan P: Continue to encourage pt to complete HEPs. Continue myofascial release followed by P/ROM and AA/ROM. Per protocal pt can increase to A/ROM but A/AROM is still below full ROM and results in much grimacing. AA/ROM likely still the best course using ball rolls, pully exercises, etc.    OT Home Exercise Plan eval: table slides, A/ROM scapular exercises           Patient will benefit from skilled therapeutic intervention in order to improve the following deficits and impairments:   Body Structure / Function / Physical Skills: ADL,UE functional use,Fascial restriction,Pain,ROM,Strength,Edema,Decreased knowledge of precautions,IADL       Visit Diagnosis: Stiffness of left shoulder, not elsewhere classified  Acute pain of left shoulder  Other symptoms and signs involving the musculoskeletal system    Problem List Patient Active Problem List   Diagnosis Date Noted  . Closed fracture of proximal end of left humerus with routine healing 07/27/2020  . Special screening for malignant neoplasms, colon 06/10/2018   . DDD (degenerative disc disease), cervical 04/06/2015  . Tinea pedis of left foot 01/04/2014  . Osteoarthritis of foot, left 01/04/2014  . Stiffness of joint, lower leg 06/29/2013  . Strain of hamstring muscle 06/29/2013  . Right hip pain 06/22/2013  . Sciatic nerve pain 04/11/2011  . Gait abnormality 09/26/2010  . STRESS FRACTURE, FOOT 04/24/2009  . MONONEURITIS, LEG 03/27/2009   Danie Chandler OT, MOT  Danie Chandler 09/08/2020, 4:14 PM  [facility] [address] [place], [place], [address] Phone: [phone]   Fax:  [phone]  Name: Clifford Harris MRN: [id number] Date of Birth: 08-26-1955

## 2020-09-12 ENCOUNTER — Ambulatory Visit (HOSPITAL_COMMUNITY): Payer: Medicare PPO | Admitting: Specialist

## 2020-09-12 ENCOUNTER — Encounter (HOSPITAL_COMMUNITY): Payer: Self-pay | Admitting: Specialist

## 2020-09-12 ENCOUNTER — Other Ambulatory Visit: Payer: Self-pay

## 2020-09-12 DIAGNOSIS — M25512 Pain in left shoulder: Secondary | ICD-10-CM

## 2020-09-12 DIAGNOSIS — R29898 Other symptoms and signs involving the musculoskeletal system: Secondary | ICD-10-CM

## 2020-09-12 DIAGNOSIS — M25612 Stiffness of left shoulder, not elsewhere classified: Secondary | ICD-10-CM

## 2020-09-12 NOTE — Patient Instructions (Signed)

## 2020-09-12 NOTE — Therapy (Signed)
[facility] [address] [place], [place], [address] Phone: [phone]   Fax:  [phone]  Occupational Therapy Treatment  Patient Details  Name: Clifford Harris MRN: [id number] Date of Birth: [birth year] Referring Provider (OT): Fuller Canada, MD   Encounter Date: 09/12/2020   OT End of Session - 09/12/20 1458    Visit Number 9    Number of Visits 16    Date for OT Re-Evaluation 10/09/20   mini reassess on 5/12   Authorization Type [employer]    Authorization Time Period 12 visits approved through 09/18/53    Authorization - Visit Number 9    Authorization - Number of Visits 12    Progress Note Due on Visit 10    OT Start Time 1305    OT Stop Time 1345    OT Time Calculation (min) 40 min    Activity Tolerance Patient tolerated treatment well    Behavior During Therapy [facility] for tasks assessed/performed           Past Medical History:  Diagnosis Date  . Arthritis   . Depression   . Neuropathy     Past Surgical History:  Procedure Laterality Date  . COLONOSCOPY N/A 02/03/2019   Procedure: COLONOSCOPY;  Surgeon: Malissa Hippo, MD;  [facility];  Service: Endoscopy;  Laterality: N/A;  730   . TONSILLECTOMY AND ADENOIDECTOMY    . WISDOM TOOTH EXTRACTION      There were no vitals filed for this visit.   Subjective Assessment - 09/12/20 1333    Subjective  S:  I have been doing the ab roller, it stretches my arm out.  Do you think I can mow the grass yet? (recommended doing 1/2 of front yard first, and waiting a few hours to see if pain level increases, if not, then can complete entire yard)    Currently in Pain? Yes    Pain Score 5     Pain Location Shoulder    Pain Orientation Left    Pain Descriptors / Indicators Aching              [facility] OT Assessment - 09/12/20 0001      Assessment   Medical Diagnosis Left Proximal Humerus Fracture    Referring Provider (OT) Fuller Canada, MD       Precautions   Precautions Shoulder    Type of Shoulder Precautions standard protocol. [id number]: P/ROM within pain tolerance. Week 3-5 (4/3-4/17: Begin AA/ROM if pain has deminished and it's tolerable. isometrics Week 6-8 (4/24-5/8): A/ROM Week 8-10 (5/8-5/22): light resistance strengthening    Shoulder Interventions Shoulder sling/immobilizer    Precaution Booklet Issued Yes (comment)                    OT Treatments/Exercises (OP) - 09/12/20 0001      Shoulder Exercises: Supine   Protraction PROM;5 reps;AAROM;15 reps    Horizontal ABduction PROM;5 reps;AAROM;15 reps    External Rotation PROM;5 reps;AAROM;15 reps    Internal Rotation PROM;5 reps;AAROM;15 reps    Flexion PROM;5 reps;AAROM;15 reps    ABduction PROM;5 reps;AAROM;15 reps      Shoulder Exercises: Stretch   Cross Chest Stretch 2 reps;10 seconds    External Rotation Stretch 2 reps;10 seconds    Wall Stretch - Flexion 2 reps;10 seconds    Wall Stretch - ABduction 2 reps;10 seconds  OT Education - 09/12/20 1335    Education Details shoulder stretches    Person(s) Educated Patient    Methods Explanation;Handout;Demonstration    Comprehension Verbalized understanding            OT Short Term Goals - 08/20/20 2117      OT SHORT TERM GOAL #1   Title Patient will be educated and independent with HEP in order to faciliate progress in therapy and allow him to return to using his LUE for all daily tasks.    Time 4    Period Weeks    Status On-going    Target Date 09/11/20      OT SHORT TERM GOAL #2   Title Patient will increase his LUE P/ROM to WNL in order to complete dressing tasks with less difficulty.    Time 4    Period Weeks    Status On-going      OT SHORT TERM GOAL #3   Title Patient will increase his LUE strength to 3/5 in order to complete wait level self care tasks without difficulty.    Time 4    Period Weeks    Status On-going      OT SHORT TERM GOAL #4   Title  Patient will decrease the fascial restrictions in his left UE to mod amount in order to increase the functional mobility needed to complete low level reaching tasks.    Time 4    Period Weeks    Status On-going      OT SHORT TERM GOAL #5   Title Patient will report a decrease in pain in the LUE during basic ADL tasks of approximately 5/10 or less.    Time 4    Period Weeks    Status On-going             OT Long Term Goals - 08/20/20 2117      OT LONG TERM GOAL #1   Title Patient will increase his LUE A/ROM to WNL while completing all high level reaching tasks as needed without difficulty.    Time 8    Period Weeks    Status On-going      OT LONG TERM GOAL #2   Title Patient will increase his LUE strength to 5/5 in order to return to desired workout routine such as running.    Time 8    Period Weeks    Status On-going      OT LONG TERM GOAL #3   Title Patient will report a decrease in pain level of approximately 3/10 or less when utilizing his LUE to complete all daily tasks.    Time 8    Period Weeks    Status On-going      OT LONG TERM GOAL #4   Title Patient will decrease his left UE fascial restrictions to min amount or less in order to increase the functional mobility needed to complete reaching tasks.    Time 8    Period Weeks    Status On-going                 Plan - 09/12/20 1459    Clinical Impression Statement A:  patient continues to have restrictions in p/rom and aa/rom at 100 degree range.  added shoulder stretches to HEP to complete 3-5 reps 2-3 times per day.  discussed mowing lawn and encouraged patient to attempt but to complete 1/2 of yard and wait a few hours to assess if pain or  stiffness occurs before finishing remainder of yard.  encouraged continued participation in daily tasks to aide in improving functional range of motion in left arm.    Plan P:  recert and 10th visit progress note, follow up on hep, add therapy ball on wall, therapy ball  presses, etc for improved bilateral upper extermity coordinated movements.  attempt sidelying a/rom.           Patient will benefit from skilled therapeutic intervention in order to improve the following deficits and impairments:           Visit Diagnosis: Stiffness of left shoulder, not elsewhere classified  Acute pain of left shoulder  Other symptoms and signs involving the musculoskeletal system    Problem List Patient Active Problem List   Diagnosis Date Noted  . Closed fracture of proximal end of left humerus with routine healing 07/27/2020  . Special screening for malignant neoplasms, colon 06/10/2018  . DDD (degenerative disc disease), cervical 04/06/2015  . Tinea pedis of left foot 01/04/2014  . Osteoarthritis of foot, left 01/04/2014  . Stiffness of joint, lower leg 06/29/2013  . Strain of hamstring muscle 06/29/2013  . Right hip pain 06/22/2013  . Sciatic nerve pain 04/11/2011  . Gait abnormality 09/26/2010  . STRESS FRACTURE, FOOT 04/24/2009  . MONONEURITIS, LEG 03/27/2009    Shirlean Mylar, MHA, OTR/L [phone]  09/12/2020, 3:12 PM  [facility] [address] [place], [place], [address] Phone: [phone]   Fax:  [phone]  Name: Clifford Harris MRN: [id number] Date of Birth: [birth year]

## 2020-09-14 ENCOUNTER — Other Ambulatory Visit: Payer: Self-pay

## 2020-09-14 ENCOUNTER — Encounter (HOSPITAL_COMMUNITY): Payer: Self-pay | Admitting: Specialist

## 2020-09-14 ENCOUNTER — Ambulatory Visit (HOSPITAL_COMMUNITY): Payer: Medicare PPO | Admitting: Specialist

## 2020-09-14 DIAGNOSIS — R29898 Other symptoms and signs involving the musculoskeletal system: Secondary | ICD-10-CM

## 2020-09-14 DIAGNOSIS — M25612 Stiffness of left shoulder, not elsewhere classified: Secondary | ICD-10-CM

## 2020-09-14 DIAGNOSIS — M25512 Pain in left shoulder: Secondary | ICD-10-CM | POA: Diagnosis not present

## 2020-09-14 NOTE — Therapy (Signed)
[facility] [address] [place], [place], [address] Phone: [phone]   Fax:  [phone]  Occupational Therapy Treatment Progress Note Reporting Period 08/14/20 to 09/14/20  See note below for Objective Data and Assessment of Progress/Goals.       Patient Details  Name: Karren CobbleDavid P Flessner MRN: [id number] Date of Birth: 07/25/1955 Referring Provider (OT): Fuller CanadaStanley Harrison, MD   Encounter Date: 09/14/2020   OT End of Session - 09/14/20 1332    Visit Number 10    Number of Visits 16    Date for OT Re-Evaluation 10/09/20    Authorization Type [employer]    Authorization Time Period 12 visits approved through 09/18/53    Authorization - Visit Number 10    Authorization - Number of Visits 12    Progress Note Due on Visit 20    OT Start Time 1115    OT Stop Time 1200    OT Time Calculation (min) 45 min    Activity Tolerance Patient tolerated treatment well    Behavior [facility] for tasks assessed/performed           Past Medical History:  Diagnosis Date  . Arthritis   . Depression   . Neuropathy     Past Surgical History:  Procedure Laterality Date  . COLONOSCOPY N/A 02/03/2019   Procedure: COLONOSCOPY;  Surgeon: Malissa Hippoehman, Najeeb U, MD;  [facility];  Service: Endoscopy;  Laterality: N/A;  730   . TONSILLECTOMY AND ADENOIDECTOMY    . WISDOM TOOTH EXTRACTION      There were no vitals filed for this visit.   Subjective Assessment - 09/14/20 1331    Subjective  S:  I mowed the grass and it went ok.  I stopped once to change the bag and empty it.  I can do more situps now with less pain in my shoulder.    Currently in Pain? Yes    Pain Score 4     Pain Location Shoulder    Pain Orientation Left              [facility] OT Assessment - 09/14/20 0001      Assessment   Medical Diagnosis Left Proximal Humerus Fracture    Referring Provider (OT) Fuller CanadaStanley Harrison, MD      Precautions   Precautions  Shoulder    Type of Shoulder Precautions standard protocol. [id number]: P/ROM within pain tolerance. Week 3-5 (4/3-4/17: Begin AA/ROM if pain has deminished and it's tolerable. isometrics Week 6-8 (4/24-5/8): A/ROM Week 8-10 (5/8-5/22): light resistance strengthening      AROM   Overall AROM Comments assessed in supine, external and internal rotation with shoulder adducted    AROM Assessment Site Shoulder    Right/Left Shoulder Left    Left Shoulder Flexion 105 Degrees    Left Shoulder ABduction 75 Degrees    Left Shoulder Internal Rotation 90 Degrees    Left Shoulder External Rotation 40 Degrees      PROM   Overall PROM Comments Assessed supine. IR/er adducted    Left Shoulder Flexion 125 Degrees   125   Left Shoulder ABduction 100 Degrees   100   Left Shoulder Internal Rotation 90 Degrees   90   Left Shoulder External Rotation 55 Degrees   40                   OT Treatments/Exercises (OP) - 09/14/20 0001      Exercises  Exercises Shoulder      Shoulder Exercises: Supine   Protraction PROM;5 reps;AAROM;15 reps    Horizontal ABduction PROM;5 reps;AAROM;15 reps    External Rotation PROM;5 reps;AAROM;15 reps    Internal Rotation PROM;5 reps;AAROM;15 reps    Flexion PROM;5 reps;AAROM;15 reps    ABduction PROM;5 reps;AAROM;15 reps      Shoulder Exercises: Standing   Extension Theraband;10 reps    Theraband Level (Shoulder Extension) Level 2 (Red)    Row Theraband;10 reps   tactile cue to complete correctly and depress shoulder   Theraband Level (Shoulder Row) Level 2 (Red)    Retraction Theraband;10 reps    Theraband Level (Shoulder Retraction) Level 2 (Red)   tactile cue to complete correctly and depress shoulder     Shoulder Exercises: ROM/Strengthening   Proximal Shoulder Strengthening, Supine 10 times each resting after each movement with max vg to increase rate of completion    Other ROM/Strengthening Exercises standing at wall walked volleyball up the wall with  hands to approximately 100 degress flexion and then down repeating 10 times    Other ROM/Strengthening Exercises chest press and flexion from 0 to 90 holding volleyball between hands, with moderate tactile and verbal guidance to decrease shoulder elevation and compensatory movements.      Manual Therapy   Manual Therapy Myofascial release    Manual therapy comments manual therapy completed seperately than all other interventions this date    Myofascial Release myofascial release and manual stretching to left upper arm, scapular, and shoulder region to decrease pain and restrictions and improve mobility to [facility]                  OT Education - 09/14/20 1331    Education Details encouraged patient to complete HEP of shoulder stretches and dowel rod exercises 2 times per day to continue progressing p/arom.    Person(s) Educated Patient    Methods Explanation;Handout;Demonstration    Comprehension Verbalized understanding            OT Short Term Goals - 09/14/20 1338      OT SHORT TERM GOAL #1   Title Patient will be educated and independent with HEP in order to faciliate progress in therapy and allow him to return to using his LUE for all daily tasks.    Time 4    Period Weeks    Status On-going    Target Date 09/11/20      OT SHORT TERM GOAL #2   Title Patient will increase his LUE P/ROM to WNL in order to complete dressing tasks with less difficulty.    Time 4    Period Weeks    Status On-going      OT SHORT TERM GOAL #3   Title Patient will increase his LUE strength to 3/5 in order to complete wait level self care tasks without difficulty.    Time 4    Period Weeks    Status On-going      OT SHORT TERM GOAL #4   Title Patient will decrease the fascial restrictions in his left UE to mod amount in order to increase the functional mobility needed to complete low level reaching tasks.    Time 4    Period Weeks    Status Achieved      OT SHORT TERM GOAL #5   Title  Patient will report a decrease in pain in the LUE during basic ADL tasks of approximately 5/10 or less.    Time  4    Period Weeks    Status Achieved             OT Long Term Goals - 09/14/20 1339      OT LONG TERM GOAL #1   Title Patient will increase his LUE A/ROM to WNL while completing all high level reaching tasks as needed without difficulty.    Time 8    Period Weeks    Status On-going      OT LONG TERM GOAL #2   Title Patient will increase his LUE strength to 5/5 in order to return to desired workout routine such as running.    Time 8    Period Weeks    Status On-going      OT LONG TERM GOAL #3   Title Patient will report a decrease in pain level of approximately 3/10 or less when utilizing his LUE to complete all daily tasks.    Time 8    Period Weeks    Status On-going      OT LONG TERM GOAL #4   Title Patient will decrease his left UE fascial restrictions to min amount or less in order to increase the functional mobility needed to complete reaching tasks.    Time 8    Period Weeks    Status On-going                 Plan - 09/14/20 1333    Clinical Impression Statement A:  patient's a/rom assessed formally for first time this date.  p/rom has improved in external rotation, with flexion and abduction unchanged from 2 weeks ago.  paitent is using his left arm with increased ease with activities at waist to mid trunk height, however, reaching and lifting above shoulder height is not feasible at this time due to pain, lack of AROM and strength.    Body Structure / Function / Physical Skills ADL;UE functional use;Fascial restriction;Pain;ROM;Strength;Edema;Decreased knowledge of precautions;IADL    OT Frequency 2x / week    OT Duration 8 weeks    Plan P:  Patient will benefit from continued skilled OT intervention with aggessive stretching, joint mobilizations at shoulder and therapeutic exercises to improve active range of motion, strength, and functional use of  LUE to PLOF.  Next session:  continue aggressive stretching, work on scapular humeral rhythm, add aa/rom in seated and begin functional reaching from waist to shoulder.           Patient will benefit from skilled therapeutic intervention in order to improve the following deficits and impairments:   Body Structure / Function / Physical Skills: ADL,UE functional use,Fascial restriction,Pain,ROM,Strength,Edema,Decreased knowledge of precautions,IADL       Visit Diagnosis: Stiffness of left shoulder, not elsewhere classified  Acute pain of left shoulder  Other symptoms and signs involving the musculoskeletal system    Problem List Patient Active Problem List   Diagnosis Date Noted  . Closed fracture of proximal end of left humerus with routine healing 07/27/2020  . Special screening for malignant neoplasms, colon 06/10/2018  . DDD (degenerative disc disease), cervical 04/06/2015  . Tinea pedis of left foot 01/04/2014  . Osteoarthritis of foot, left 01/04/2014  . Stiffness of joint, lower leg 06/29/2013  . Strain of hamstring muscle 06/29/2013  . Right hip pain 06/22/2013  . Sciatic nerve pain 04/11/2011  . Gait abnormality 09/26/2010  . STRESS FRACTURE, FOOT 04/24/2009  . MONONEURITIS, LEG 03/27/2009    Shirlean Mylar, MHA, OTR/L [phone]  09/14/2020,  1:46 PM  [facility] [address] [place], [place], [address] Phone: [phone]   Fax:  [phone]  Name: RAMSAY BOGNAR MRN: [id number] Date of Birth: [birth year]

## 2020-09-18 ENCOUNTER — Encounter (HOSPITAL_COMMUNITY): Payer: Self-pay

## 2020-09-18 ENCOUNTER — Other Ambulatory Visit: Payer: Self-pay

## 2020-09-18 ENCOUNTER — Ambulatory Visit (HOSPITAL_COMMUNITY): Payer: Medicare PPO

## 2020-09-18 DIAGNOSIS — R29898 Other symptoms and signs involving the musculoskeletal system: Secondary | ICD-10-CM | POA: Diagnosis not present

## 2020-09-18 DIAGNOSIS — M25512 Pain in left shoulder: Secondary | ICD-10-CM | POA: Diagnosis not present

## 2020-09-18 DIAGNOSIS — M25612 Stiffness of left shoulder, not elsewhere classified: Secondary | ICD-10-CM

## 2020-09-18 NOTE — Therapy (Addendum)
[facility] [address] [place], [place], [address] Phone: [phone]   Fax:  [phone]  Occupational Therapy Treatment  Patient Details  Name: Clifford Harris MRN: [id number] Date of Birth: October 17, 1955 Referring Provider (OT): Fuller Canada, MD   Encounter Date: 09/18/2020   OT End of Session - 09/18/20 1137    Visit Number 11    Number of Visits 18   Date for OT Re-Evaluation 10/09/20    Authorization Type [employer]    Authorization Time Period 12 visits approved through 09/18/53. *Sent new authorization for visits to [facility] on 09/18/20    Authorization - Visit Number 11    Authorization - Number of Visits 12    Progress Note Due on Visit 20    OT Start Time 1030    OT Stop Time 1112    OT Time Calculation (min) 42 min    Activity Tolerance Patient tolerated treatment well    Behavior During Therapy WFL for tasks assessed/performed           Past Medical History:  Diagnosis Date  . Arthritis   . Depression   . Neuropathy     Past Surgical History:  Procedure Laterality Date  . COLONOSCOPY N/A 02/03/2019   Procedure: COLONOSCOPY;  Surgeon: Malissa Hippo, MD;  [facility];  Service: Endoscopy;  Laterality: N/A;  730   . TONSILLECTOMY AND ADENOIDECTOMY    . WISDOM TOOTH EXTRACTION      There were no vitals filed for this visit.   Subjective Assessment - 09/18/20 1036    Subjective  S: I woke up this morning and had this pain.    Currently in Pain? Yes    Pain Score 7     Pain Location Shoulder    Pain Orientation Left    Pain Descriptors / Indicators Sore    Pain Type Acute pain    Pain Onset Today    Aggravating Factors  unknown. Maybe due to doing more with it and working out more.    Pain Relieving Factors ice    Effect of Pain on Daily Activities Max effect    Multiple Pain Sites No              [facility] OT Assessment - 09/18/20 1107      Assessment   Medical Diagnosis Left  Proximal Humerus Fracture      Precautions   Precautions Shoulder    Type of Shoulder Precautions standard protocol. [id number]: P/ROM within pain tolerance. Week 3-5 (4/3-4/17: Begin AA/ROM if pain has deminished and it's tolerable. isometrics Week 6-8 (4/24-5/8): A/ROM Week 8-10 (5/8-5/22): light resistance strengthening                    OT Treatments/Exercises (OP) - 09/18/20 1109      Exercises   Exercises Shoulder      Shoulder Exercises: Supine   Protraction PROM;5 reps    Horizontal ABduction PROM;5 reps    External Rotation PROM;5 reps    Internal Rotation PROM;5 reps    Flexion PROM;5 reps    ABduction PROM;5 reps      Shoulder Exercises: Standing   Horizontal ABduction AROM;5 reps    Flexion AROM;5 reps    ABduction AROM;5 reps      Manual Therapy   Manual Therapy Myofascial release;Scapular mobilization;Other (comment)    Manual therapy comments manual therapy completed seperately than all other interventions this date  Myofascial Release myofascial release and manual stretching to left upper arm, scapular, and shoulder region to decrease pain and restrictions and improve mobility to [facility]    Scapular Mobilization Scapular mobilization completed during passive ROM supine to the LUE to increase joint mobility and increase ability to complete functional shoulder movement.    Other Manual Therapy Massage gun used on level 1 setting, small foam ball attachment focusing on left bicep, anterior shoulder, and upper trapezius to decrease fascial restrictions and increase joint mobility.                  OT Education - 09/18/20 1136    Education Details Use of massage gun to decrease muscle knots/fascial restrictions.    Person(s) Educated Patient    Methods Explanation;Demonstration    Comprehension Verbalized understanding            OT Short Term Goals - 09/18/20 1144      OT SHORT TERM GOAL #1   Title Patient will be educated and independent with  HEP in order to faciliate progress in therapy and allow him to return to using his LUE for all daily tasks.    Time 4    Period Weeks    Status On-going    Target Date 09/11/20      OT SHORT TERM GOAL #2   Title Patient will increase his LUE P/ROM to WNL in order to complete dressing tasks with less difficulty.    Time 4    Period Weeks    Status On-going      OT SHORT TERM GOAL #3   Title Patient will increase his LUE strength to 3/5 in order to complete wait level self care tasks without difficulty.    Time 4    Period Weeks    Status On-going      OT SHORT TERM GOAL #4   Title Patient will decrease the fascial restrictions in his left UE to mod amount in order to increase the functional mobility needed to complete low level reaching tasks.    Time 4    Period Weeks      OT SHORT TERM GOAL #5   Title Patient will report a decrease in pain in the LUE during basic ADL tasks of approximately 5/10 or less.    Time 4    Period Weeks             OT Long Term Goals - 09/14/20 1339      OT LONG TERM GOAL #1   Title Patient will increase his LUE A/ROM to WNL while completing all high level reaching tasks as needed without difficulty.    Time 8    Period Weeks    Status On-going      OT LONG TERM GOAL #2   Title Patient will increase his LUE strength to 5/5 in order to return to desired workout routine such as running.    Time 8    Period Weeks    Status On-going      OT LONG TERM GOAL #3   Title Patient will report a decrease in pain level of approximately 3/10 or less when utilizing his LUE to complete all daily tasks.    Time 8    Period Weeks    Status On-going      OT LONG TERM GOAL #4   Title Patient will decrease his left UE fascial restrictions to min amount or less in order to increase the functional mobility  needed to complete reaching tasks.    Time 8    Period Weeks    Status On-going                 Plan - 09/18/20 1138    Clinical Impression  Statement A: Focused session on manula techniques and mobilizations to decrease fascial restrictions located in the left upper arm, trapezius, and scapularis region in order to increase joint mobility. Pectoralis release completed with patient able to achieve further ROM during passive horizontal abduction. Scapular mobilization were completed to address decreased scapular mobility. Massage gun assisted with decreasing fascial restriction in the left UE in order to increase ROM during P/ROM. VC were provided during standing A/ROM exercises to decrease shoulder and refrain from scapular elevation.    Body Structure / Function / Physical Skills ADL;UE functional use;Fascial restriction;Pain;ROM;Strength;Edema;Decreased knowledge of precautions;IADL    Plan P: Complete UEFI Continue with Manual techniques to decrease facial restrictions and increase joint mobility. Complete muscle energy technique during P/ROM. Complete functional reaching task.    Consulted and Agree with Plan of Care Patient           Patient will benefit from skilled therapeutic intervention in order to improve the following deficits and impairments:   Body Structure / Function / Physical Skills: ADL,UE functional use,Fascial restriction,Pain,ROM,Strength,Edema,Decreased knowledge of precautions,IADL       Visit Diagnosis: Stiffness of left shoulder, not elsewhere classified  Acute pain of left shoulder  Other symptoms and signs involving the musculoskeletal system    Problem List Patient Active Problem List   Diagnosis Date Noted  . Closed fracture of proximal end of left humerus with routine healing 07/27/2020  . Special screening for malignant neoplasms, colon 06/10/2018  . DDD (degenerative disc disease), cervical 04/06/2015  . Tinea pedis of left foot 01/04/2014  . Osteoarthritis of foot, left 01/04/2014  . Stiffness of joint, lower leg 06/29/2013  . Strain of hamstring muscle 06/29/2013  . Right hip pain  06/22/2013  . Sciatic nerve pain 04/11/2011  . Gait abnormality 09/26/2010  . STRESS FRACTURE, FOOT 04/24/2009  . MONONEURITIS, LEG 03/27/2009    Limmie Patricia, OTR/L,CBIS  [phone]  09/18/2020, 11:59 AM  [facility] [address] [place], [place], [address] Phone: [phone]   Fax:  [phone]  Name: Clifford Harris MRN: [id number] Date of Birth: 1955-05-09

## 2020-09-19 ENCOUNTER — Ambulatory Visit (HOSPITAL_COMMUNITY): Payer: Medicare PPO

## 2020-09-19 DIAGNOSIS — M25612 Stiffness of left shoulder, not elsewhere classified: Secondary | ICD-10-CM | POA: Diagnosis not present

## 2020-09-19 DIAGNOSIS — R29898 Other symptoms and signs involving the musculoskeletal system: Secondary | ICD-10-CM

## 2020-09-19 DIAGNOSIS — M25512 Pain in left shoulder: Secondary | ICD-10-CM

## 2020-09-20 ENCOUNTER — Encounter (HOSPITAL_COMMUNITY): Payer: Self-pay

## 2020-09-20 NOTE — Therapy (Signed)
[facility] [address] [place], [place], [address] Phone: [phone]   Fax:  [phone]  Occupational Therapy Treatment  Patient Details  Name: Clifford Harris MRN: [id number] Date of Birth: 23-Feb-1956 Referring Provider (OT): Fuller Canada, MD   Encounter Date: 09/19/2020   OT End of Session - 09/20/20 1228    Visit Number 12    Number of Visits 18    Date for OT Re-Evaluation 10/09/20    Authorization Type [employer]    Authorization Time Period 12 visits approved through 09/18/53. *Sent new authorization for visits to [facility] on 09/18/20    Authorization - Visit Number 12    Authorization - Number of Visits 12    Progress Note Due on Visit 20    OT Start Time 1347    OT Stop Time 1430    OT Time Calculation (min) 43 min    Activity Tolerance Patient tolerated treatment well    Behavior During Therapy WFL for tasks assessed/performed           Past Medical History:  Diagnosis Date  . Arthritis   . Depression   . Neuropathy     Past Surgical History:  Procedure Laterality Date  . COLONOSCOPY N/A 02/03/2019   Procedure: COLONOSCOPY;  Surgeon: Malissa Hippo, MD;  [facility];  Service: Endoscopy;  Laterality: N/A;  730   . TONSILLECTOMY AND ADENOIDECTOMY    . WISDOM TOOTH EXTRACTION      There were no vitals filed for this visit.   Subjective Assessment - 09/20/20 0939    Subjective  S: My pain level goes up and down. Sometimes it's a 4 then it's a 7, then it's an 8.    Currently in Pain? Yes    Pain Score 6     Pain Location Shoulder    Pain Orientation Left    Pain Descriptors / Indicators Sore    Pain Type Acute pain    Pain Onset Today    Pain Frequency Constant    Aggravating Factors  unknown    Pain Relieving Factors ice    Effect of Pain on Daily Activities max effect              [facility] OT Assessment - 09/20/20 0940      Assessment   Medical Diagnosis Left Proximal Humerus  Fracture      Precautions   Precautions Shoulder    Type of Shoulder Precautions standard protocol. [id number]: P/ROM within pain tolerance. Week 3-5 (4/3-4/17: Begin AA/ROM if pain has deminished and it's tolerable. isometrics Week 6-8 (4/24-5/8): A/ROM Week 8-10 (5/8-5/22): light resistance strengthening                    OT Treatments/Exercises (OP) - 09/20/20 0940      Exercises   Exercises Shoulder      Manual Therapy   Manual Therapy Myofascial release;Scapular mobilization;Other (comment);Muscle Energy Technique    Manual therapy comments manual therapy completed seperately than all other interventions this date    Myofascial Release myofascial release and manual stretching to left upper arm, scapular, and shoulder region to decrease pain and restrictions and improve mobility to [facility]    Scapular Mobilization Scapular mobilization completed during passive ROM supine to the LUE to increase joint mobility and increase ability to complete functional shoulder movement.    Other Manual Therapy Massage gun used on level 1 setting, small foam ball attachment focusing on  left bicep, anterior shoulder, and upper trapezius to decrease fascial restrictions and increase joint mobility.    Muscle Energy Technique Muscle energy technique used to left shoulder flexors and external rotators to relax tone and muscle spasm and improve range of motion.                    OT Short Term Goals - 09/18/20 1144      OT SHORT TERM GOAL #1   Title Patient will be educated and independent with HEP in order to faciliate progress in therapy and allow him to return to using his LUE for all daily tasks.    Time 4    Period Weeks    Status On-going    Target Date 09/11/20      OT SHORT TERM GOAL #2   Title Patient will increase his LUE P/ROM to WNL in order to complete dressing tasks with less difficulty.    Time 4    Period Weeks    Status On-going      OT SHORT TERM GOAL #3   Title  Patient will increase his LUE strength to 3/5 in order to complete wait level self care tasks without difficulty.    Time 4    Period Weeks    Status On-going      OT SHORT TERM GOAL #4   Title Patient will decrease the fascial restrictions in his left UE to mod amount in order to increase the functional mobility needed to complete low level reaching tasks.    Time 4    Period Weeks      OT SHORT TERM GOAL #5   Title Patient will report a decrease in pain in the LUE during basic ADL tasks of approximately 5/10 or less.    Time 4    Period Weeks             OT Long Term Goals - 09/14/20 1339      OT LONG TERM GOAL #1   Title Patient will increase his LUE A/ROM to WNL while completing all high level reaching tasks as needed without difficulty.    Time 8    Period Weeks    Status On-going      OT LONG TERM GOAL #2   Title Patient will increase his LUE strength to 5/5 in order to return to desired workout routine such as running.    Time 8    Period Weeks    Status On-going      OT LONG TERM GOAL #3   Title Patient will report a decrease in pain level of approximately 3/10 or less when utilizing his LUE to complete all daily tasks.    Time 8    Period Weeks    Status On-going      OT LONG TERM GOAL #4   Title Patient will decrease his left UE fascial restrictions to min amount or less in order to increase the functional mobility needed to complete reaching tasks.    Time 8    Period Weeks    Status On-going                 Plan - 09/20/20 1321    Clinical Impression Statement A: Focused session on manual techniques and mobilizations to decrease fascial restrictions located in the left upper arm, trapezius, and scapularis region in order to increase joint mobility.  Scapular mobilization were completed to address decreased scapular mobility. Massage gun assisted with  decreasing fascial restriction in the left UE in order to increase ROM during P/ROM. VC were  provided during standing A/ROM exercises to decrease shoulder and refrain from scapular elevation. Muscle energy technique completed to help increase passive and active ROM. Technique helped increase ROM slightly. Continues to present with joint limitations which is very similar to a frozen shoulder. Discussed increasing frequency to 3X as needed. Patient is interested in change and would like to pursue. OT will send an updated cert to MD.    Body Structure / Function / Physical Skills ADL;UE functional use;Fascial restriction;Pain;ROM;Strength;Edema;Decreased knowledge of precautions;IADL    OT Frequency 3x / week    OT Treatment/Interventions Self-care/ADL training;Ultrasound;Patient/family education;DME and/or AE instruction;Passive range of motion;Cryotherapy;Electrical Stimulation;Moist Heat;Neuromuscular education;Therapeutic exercise;Manual Therapy;Therapeutic activities    Plan P: Increased to 3X a week.  Continue with Manual techniques to decrease facial restrictions and increase joint mobility.  Complete functional reaching task.           Patient will benefit from skilled therapeutic intervention in order to improve the following deficits and impairments:   Body Structure / Function / Physical Skills: ADL,UE functional use,Fascial restriction,Pain,ROM,Strength,Edema,Decreased knowledge of precautions,IADL       Visit Diagnosis: Acute pain of left shoulder - Plan: Ot plan of care cert/re-cert  Stiffness of left shoulder, not elsewhere classified - Plan: Ot plan of care cert/re-cert  Other symptoms and signs involving the musculoskeletal system - Plan: Ot plan of care cert/re-cert    Problem List Patient Active Problem List   Diagnosis Date Noted  . Closed fracture of proximal end of left humerus with routine healing 07/27/2020  . Special screening for malignant neoplasms, colon 06/10/2018  . DDD (degenerative disc disease), cervical 04/06/2015  . Tinea pedis of left foot  01/04/2014  . Osteoarthritis of foot, left 01/04/2014  . Stiffness of joint, lower leg 06/29/2013  . Strain of hamstring muscle 06/29/2013  . Right hip pain 06/22/2013  . Sciatic nerve pain 04/11/2011  . Gait abnormality 09/26/2010  . STRESS FRACTURE, FOOT 04/24/2009  . MONONEURITIS, LEG 03/27/2009    Limmie Patricia, OTR/L,CBIS  [phone]  09/20/2020, 1:33 PM  [facility] [address] [place], [place], [address] Phone: [phone]   Fax:  [phone]  Name: Clifford Harris MRN: [id number] Date of Birth: Sep 20, 1955

## 2020-09-21 ENCOUNTER — Encounter (HOSPITAL_COMMUNITY): Payer: Self-pay | Admitting: Occupational Therapy

## 2020-09-21 ENCOUNTER — Other Ambulatory Visit: Payer: Self-pay

## 2020-09-21 ENCOUNTER — Ambulatory Visit (HOSPITAL_COMMUNITY): Payer: Medicare PPO | Admitting: Occupational Therapy

## 2020-09-21 DIAGNOSIS — R29898 Other symptoms and signs involving the musculoskeletal system: Secondary | ICD-10-CM | POA: Diagnosis not present

## 2020-09-21 DIAGNOSIS — M25612 Stiffness of left shoulder, not elsewhere classified: Secondary | ICD-10-CM | POA: Diagnosis not present

## 2020-09-21 DIAGNOSIS — M25512 Pain in left shoulder: Secondary | ICD-10-CM | POA: Diagnosis not present

## 2020-09-21 NOTE — Therapy (Signed)
[facility] [address] [place], [place], [address] Phone: [phone]   Fax:  [phone]  Occupational Therapy Treatment  Patient Details  Name: Clifford Harris MRN: [id number] Date of Birth: 05/19/1955 Referring Provider (OT): Fuller Canada, MD   Encounter Date: 09/21/2020   OT End of Session - 09/21/20 1719    Visit Number 13    Number of Visits 18    Date for OT Re-Evaluation 10/09/20    Authorization Type [employer]    Authorization Time Period 8 visits approved 5/17-10/09/20    Authorization - Visit Number 1    Authorization - Number of Visits 8    Progress Note Due on Visit 20    OT Start Time 1636    OT Stop Time 1718    OT Time Calculation (min) 42 min    Activity Tolerance Patient tolerated treatment well    Behavior During Therapy [facility] for tasks assessed/performed           Past Medical History:  Diagnosis Date  . Arthritis   . Depression   . Neuropathy     Past Surgical History:  Procedure Laterality Date  . COLONOSCOPY N/A 02/03/2019   Procedure: COLONOSCOPY;  Surgeon: Malissa Hippo, MD;  [facility];  Service: Endoscopy;  Laterality: N/A;  730   . TONSILLECTOMY AND ADENOIDECTOMY    . WISDOM TOOTH EXTRACTION      There were no vitals filed for this visit.   Subjective Assessment - 09/21/20 1637    Subjective  S: It was hurting for a while this morning.    Currently in Pain? Yes    Pain Score 6     Pain Location Shoulder    Pain Orientation Left    Pain Descriptors / Indicators Sore    Pain Type Acute pain    Pain Radiating Towards None    Pain Onset Today    Pain Frequency Intermittent    Aggravating Factors  unknown    Pain Relieving Factors ice    Effect of Pain on Daily Activities max effect on ADLs    Multiple Pain Sites No              [facility] OT Assessment - 09/21/20 1635      Assessment   Medical Diagnosis Left Proximal Humerus Fracture      Precautions    Precautions Shoulder    Type of Shoulder Precautions standard protocol. UDTH4-3: P/ROM within pain tolerance. Week 3-5 (4/3-4/17): Begin AA/ROM if pain has deminished and it's tolerable. isometrics Week 6-8 (4/24-5/8): A/ROM Week 8-10 (5/8-5/22): light resistance strengthening                    OT Treatments/Exercises (OP) - 09/21/20 1639      Exercises   Exercises Shoulder      Shoulder Exercises: Supine   Protraction PROM;5 reps;AROM;10 reps    Horizontal ABduction PROM;5 reps;AROM;10 reps    External Rotation PROM;5 reps    Internal Rotation PROM;5 reps    Flexion PROM;5 reps;AAROM;10 reps    ABduction PROM;5 reps      Shoulder Exercises: Standing   Extension Theraband;10 reps    Theraband Level (Shoulder Extension) Level 2 (Red)    Row Theraband;10 reps    Theraband Level (Shoulder Row) Level 2 (Red)    Retraction Theraband;10 reps    Theraband Level (Shoulder Retraction) Level 2 (Red)      Shoulder Exercises: ROM/Strengthening  Other ROM/Strengthening Exercises ball pass behind back for IR, behind head for er, 10X each using tennis balls      Shoulder Exercises: Stretch   Internal Rotation Stretch 2 reps   10" holds, horizontal towel   External Rotation Stretch 2 reps;10 seconds    Wall Stretch - Flexion 2 reps;10 seconds    Wall Stretch - ABduction 2 reps;10 seconds      Manual Therapy   Manual Therapy Myofascial release;Other (comment);Muscle Energy Technique    Manual therapy comments manual therapy completed seperately than all other interventions this date    Myofascial Release myofascial release and manual stretching to left upper arm, scapular, and shoulder region to decrease pain and restrictions and improve mobility to [facility]    Other Manual Therapy Massage gun used on level 1 setting, small foam ball attachment focusing on left bicep, anterior shoulder, and upper trapezius to decrease fascial restrictions and increase joint mobility.    Muscle Energy  Technique Muscle energy technique used to left shoulder flexors and external rotators to relax tone and muscle spasm and improve range of motion.                    OT Short Term Goals - 09/18/20 1144      OT SHORT TERM GOAL #1   Title Patient will be educated and independent with HEP in order to faciliate progress in therapy and allow him to return to using his LUE for all daily tasks.    Time 4    Period Weeks    Status On-going    Target Date 09/11/20      OT SHORT TERM GOAL #2   Title Patient will increase his LUE P/ROM to WNL in order to complete dressing tasks with less difficulty.    Time 4    Period Weeks    Status On-going      OT SHORT TERM GOAL #3   Title Patient will increase his LUE strength to 3/5 in order to complete wait level self care tasks without difficulty.    Time 4    Period Weeks    Status On-going      OT SHORT TERM GOAL #4   Title Patient will decrease the fascial restrictions in his left UE to mod amount in order to increase the functional mobility needed to complete low level reaching tasks.    Time 4    Period Weeks      OT SHORT TERM GOAL #5   Title Patient will report a decrease in pain in the LUE during basic ADL tasks of approximately 5/10 or less.    Time 4    Period Weeks             OT Long Term Goals - 09/14/20 1339      OT LONG TERM GOAL #1   Title Patient will increase his LUE A/ROM to WNL while completing all high level reaching tasks as needed without difficulty.    Time 8    Period Weeks    Status On-going      OT LONG TERM GOAL #2   Title Patient will increase his LUE strength to 5/5 in order to return to desired workout routine such as running.    Time 8    Period Weeks    Status On-going      OT LONG TERM GOAL #3   Title Patient will report a decrease in pain level of approximately 3/10 or  less when utilizing his LUE to complete all daily tasks.    Time 8    Period Weeks    Status On-going      OT LONG  TERM GOAL #4   Title Patient will decrease his left UE fascial restrictions to min amount or less in order to increase the functional mobility needed to complete reaching tasks.    Time 8    Period Weeks    Status On-going                 Plan - 09/21/20 1709    Clinical Impression Statement A: Continued with manual techniques to address fascial restrictions and muscle knots. Passive stretching completed with muscle energy technique in flexion, slight increase in ROM noted. Session focusing on sustained shoulder stretches, resumed scapular theraband. Verbal cuing for form and technique.    Body Structure / Function / Physical Skills ADL;UE functional use;Fascial restriction;Pain;ROM;Strength;Edema;Decreased knowledge of precautions;IADL    Plan P: Continue with manual techniques to decrease facial restrictions and increase joint mobility.  Complete functional reaching task.    OT Home Exercise Plan eval: table slides, A/ROM scapular exercises    Consulted and Agree with Plan of Care Patient           Patient will benefit from skilled therapeutic intervention in order to improve the following deficits and impairments:   Body Structure / Function / Physical Skills: ADL,UE functional use,Fascial restriction,Pain,ROM,Strength,Edema,Decreased knowledge of precautions,IADL       Visit Diagnosis: Acute pain of left shoulder  Stiffness of left shoulder, not elsewhere classified  Other symptoms and signs involving the musculoskeletal system    Problem List Patient Active Problem List   Diagnosis Date Noted  . Closed fracture of proximal end of left humerus with routine healing 07/27/2020  . Special screening for malignant neoplasms, colon 06/10/2018  . DDD (degenerative disc disease), cervical 04/06/2015  . Tinea pedis of left foot 01/04/2014  . Osteoarthritis of foot, left 01/04/2014  . Stiffness of joint, lower leg 06/29/2013  . Strain of hamstring muscle 06/29/2013  .  Right hip pain 06/22/2013  . Sciatic nerve pain 04/11/2011  . Gait abnormality 09/26/2010  . STRESS FRACTURE, FOOT 04/24/2009  . MONONEURITIS, LEG 03/27/2009   Ezra Sites, OTR/L  [phone] 09/21/2020, 5:20 PM  [facility] [address] [place], [place], [address] Phone: [phone]   Fax:  [phone]  Name: Clifford Harris MRN: [id number] Date of Birth: 1955/06/18

## 2020-09-22 ENCOUNTER — Encounter (HOSPITAL_COMMUNITY): Payer: Medicare PPO

## 2020-09-26 ENCOUNTER — Encounter (HOSPITAL_COMMUNITY): Payer: Medicare PPO | Admitting: Occupational Therapy

## 2020-09-28 ENCOUNTER — Encounter (HOSPITAL_COMMUNITY): Payer: Medicare PPO

## 2020-10-03 ENCOUNTER — Other Ambulatory Visit: Payer: Self-pay

## 2020-10-03 ENCOUNTER — Ambulatory Visit (HOSPITAL_COMMUNITY): Payer: Medicare PPO | Admitting: Occupational Therapy

## 2020-10-03 ENCOUNTER — Encounter (HOSPITAL_COMMUNITY): Payer: Self-pay | Admitting: Occupational Therapy

## 2020-10-03 DIAGNOSIS — R29898 Other symptoms and signs involving the musculoskeletal system: Secondary | ICD-10-CM

## 2020-10-03 DIAGNOSIS — M25512 Pain in left shoulder: Secondary | ICD-10-CM | POA: Diagnosis not present

## 2020-10-03 DIAGNOSIS — Z1283 Encounter for screening for malignant neoplasm of skin: Secondary | ICD-10-CM | POA: Diagnosis not present

## 2020-10-03 DIAGNOSIS — M25612 Stiffness of left shoulder, not elsewhere classified: Secondary | ICD-10-CM

## 2020-10-03 DIAGNOSIS — D225 Melanocytic nevi of trunk: Secondary | ICD-10-CM | POA: Diagnosis not present

## 2020-10-03 NOTE — Therapy (Signed)
[facility] [address] [place], [place], [address] Phone: [phone]   Fax:  [phone]  Occupational Therapy Treatment  Patient Details  Name: Clifford Harris MRN: [id number] Date of Birth: [birth year] Referring Provider (OT): Fuller Canada, MD   Encounter Date: 10/03/2020   OT End of Session - 10/03/20 1342    Visit Number 14    Number of Visits 18    Date for OT Re-Evaluation 10/09/20    Authorization Type [employer]    Authorization Time Period 8 visits approved 5/17-10/09/20    Authorization - Visit Number 2    Authorization - Number of Visits 8    Progress Note Due on Visit 20    OT Start Time 1300    OT Stop Time 1341    OT Time Calculation (min) 41 min    Activity Tolerance Patient tolerated treatment well    Behavior During Therapy [facility] for tasks assessed/performed           Past Medical History:  Diagnosis Date  . Arthritis   . Depression   . Neuropathy     Past Surgical History:  Procedure Laterality Date  . COLONOSCOPY N/A 02/03/2019   Procedure: COLONOSCOPY;  Surgeon: Malissa Hippo, MD;  [facility];  Service: Endoscopy;  Laterality: N/A;  730   . TONSILLECTOMY AND ADENOIDECTOMY    . WISDOM TOOTH EXTRACTION      There were no vitals filed for this visit.   Subjective Assessment - 10/03/20 1257    Subjective  S: It's been hurting since I was getting a pressure washer out of the back.    Currently in Pain? Yes    Pain Score 4     Pain Location Shoulder    Pain Orientation Left    Pain Descriptors / Indicators Sore    Pain Type Acute pain    Pain Radiating Towards None    Pain Onset In the past 7 days    Pain Frequency Intermittent    Aggravating Factors  unknown    Pain Relieving Factors ice    Effect of Pain on Daily Activities mod effect on ADLs    Multiple Pain Sites No              [facility] OT Assessment - 10/03/20 1257      Assessment   Medical Diagnosis Left  Proximal Humerus Fracture      Precautions   Precautions Shoulder    Type of Shoulder Precautions standard protocol. TIRW4-3: P/ROM within pain tolerance. Week 3-5 (4/3-4/17): Begin AA/ROM if pain has deminished and it's tolerable. isometrics Week 6-8 (4/24-5/8): A/ROM Week 8-10 (5/8-5/22): light resistance strengthening                    OT Treatments/Exercises (OP) - 10/03/20 1303      Exercises   Exercises Shoulder      Shoulder Exercises: Supine   Protraction PROM;5 reps;AROM;10 reps    Horizontal ABduction PROM;5 reps;AROM;10 reps    Flexion PROM;5 reps;AROM;10 reps      Shoulder Exercises: Stretch   Internal Rotation Stretch 2 reps   30" horizontal towel   External Rotation Stretch 2 reps;30 seconds    Wall Stretch - Flexion 2 reps;30 seconds    Wall Stretch - ABduction 2 reps;30 seconds    Other Shoulder Stretches doorway stretch, 2x, 30" holds      Functional Reaching Activities   High Level Pt placing  10 cones on top shelf of overhead cabinet in flexion, removing in abduction      Manual Therapy   Manual Therapy Myofascial release;Other (comment);Muscle Energy Technique    Manual therapy comments manual therapy completed seperately than all other interventions this date    Myofascial Release myofascial release and manual stretching to left upper arm, scapular, and shoulder region to decrease pain and restrictions and improve mobility to [facility]    Other Manual Therapy Massage gun used on level 1 setting, small foam ball attachment focusing on left bicep, anterior shoulder, and upper trapezius to decrease fascial restrictions and increase joint mobility.    Muscle Energy Technique Muscle energy technique used to left shoulder flexors and external rotators to relax tone and muscle spasm and improve range of motion.                    OT Short Term Goals - 09/18/20 1144      OT SHORT TERM GOAL #1   Title Patient will be educated and independent with HEP  in order to faciliate progress in therapy and allow him to return to using his LUE for all daily tasks.    Time 4    Period Weeks    Status On-going    Target Date 09/11/20      OT SHORT TERM GOAL #2   Title Patient will increase his LUE P/ROM to WNL in order to complete dressing tasks with less difficulty.    Time 4    Period Weeks    Status On-going      OT SHORT TERM GOAL #3   Title Patient will increase his LUE strength to 3/5 in order to complete wait level self care tasks without difficulty.    Time 4    Period Weeks    Status On-going      OT SHORT TERM GOAL #4   Title Patient will decrease the fascial restrictions in his left UE to mod amount in order to increase the functional mobility needed to complete low level reaching tasks.    Time 4    Period Weeks      OT SHORT TERM GOAL #5   Title Patient will report a decrease in pain in the LUE during basic ADL tasks of approximately 5/10 or less.    Time 4    Period Weeks             OT Long Term Goals - 09/14/20 1339      OT LONG TERM GOAL #1   Title Patient will increase his LUE A/ROM to WNL while completing all high level reaching tasks as needed without difficulty.    Time 8    Period Weeks    Status On-going      OT LONG TERM GOAL #2   Title Patient will increase his LUE strength to 5/5 in order to return to desired workout routine such as running.    Time 8    Period Weeks    Status On-going      OT LONG TERM GOAL #3   Title Patient will report a decrease in pain level of approximately 3/10 or less when utilizing his LUE to complete all daily tasks.    Time 8    Period Weeks    Status On-going      OT LONG TERM GOAL #4   Title Patient will decrease his left UE fascial restrictions to min amount or less in order to  increase the functional mobility needed to complete reaching tasks.    Time 8    Period Weeks    Status On-going                 Plan - 10/03/20 1342    Clinical Impression  Statement A: Continued with manual techniques to address fascial restrictions, passive stretching with muscle energy techinque. Completing A/ROM in supine and sustained shoulder stretches with holds increased to 30". Added functional reaching task today. Pt with improved ROM available, reaching to approximately 75% range in flexion and abduction.    Body Structure / Function / Physical Skills ADL;UE functional use;Fascial restriction;Pain;ROM;Strength;Edema;Decreased knowledge of precautions;IADL    Plan P: Continue with manual techniques, attempt A/ROM in sidelying, functional reaching, update HEP    OT Home Exercise Plan 5/10: shoulder stretches    Consulted and Agree with Plan of Care Patient           Patient will benefit from skilled therapeutic intervention in order to improve the following deficits and impairments:   Body Structure / Function / Physical Skills: ADL,UE functional use,Fascial restriction,Pain,ROM,Strength,Edema,Decreased knowledge of precautions,IADL       Visit Diagnosis: Acute pain of left shoulder  Stiffness of left shoulder, not elsewhere classified  Other symptoms and signs involving the musculoskeletal system    Problem List Patient Active Problem List   Diagnosis Date Noted  . Closed fracture of proximal end of left humerus with routine healing 07/27/2020  . Special screening for malignant neoplasms, colon 06/10/2018  . DDD (degenerative disc disease), cervical 04/06/2015  . Tinea pedis of left foot 01/04/2014  . Osteoarthritis of foot, left 01/04/2014  . Stiffness of joint, lower leg 06/29/2013  . Strain of hamstring muscle 06/29/2013  . Right hip pain 06/22/2013  . Sciatic nerve pain 04/11/2011  . Gait abnormality 09/26/2010  . STRESS FRACTURE, FOOT 04/24/2009  . MONONEURITIS, LEG 03/27/2009    Ezra Sites, OTR/L  [phone] 10/03/2020, 1:44 PM  [place] [facility] [address] [place], [place],  [address] Phone: [phone]   Fax:  [phone]  Name: CLERENCE GUBSER MRN: [id number] Date of Birth: 11/08/1955

## 2020-10-06 ENCOUNTER — Ambulatory Visit (HOSPITAL_COMMUNITY): Payer: Medicare PPO | Attending: Orthopedic Surgery | Admitting: Occupational Therapy

## 2020-10-06 ENCOUNTER — Encounter (HOSPITAL_COMMUNITY): Payer: Self-pay | Admitting: Occupational Therapy

## 2020-10-06 ENCOUNTER — Other Ambulatory Visit: Payer: Self-pay

## 2020-10-06 DIAGNOSIS — M25512 Pain in left shoulder: Secondary | ICD-10-CM | POA: Insufficient documentation

## 2020-10-06 DIAGNOSIS — R29898 Other symptoms and signs involving the musculoskeletal system: Secondary | ICD-10-CM | POA: Diagnosis not present

## 2020-10-06 DIAGNOSIS — M25612 Stiffness of left shoulder, not elsewhere classified: Secondary | ICD-10-CM | POA: Diagnosis not present

## 2020-10-06 NOTE — Therapy (Signed)
[facility] [address] [place], [place], [address] Phone: [phone]   Fax:  [phone]  Occupational Therapy Treatment  Patient Details  Name: RAMAN FEATHERSTON MRN: [id number] Date of Birth: 23-Dec-1955 Referring Provider (OT): Fuller Canada, MD   Encounter Date: 10/06/2020   OT End of Session - 10/06/20 1158    Visit Number 15    Number of Visits 18    Date for OT Re-Evaluation 10/09/20    Authorization Type [employer]    Authorization Time Period 8 visits approved 5/17-10/09/20; requesting 12 additional visits    Authorization - Visit Number 3    Authorization - Number of Visits 8    Progress Note Due on Visit 20    OT Start Time 1118    OT Stop Time 1157    OT Time Calculation (min) 39 min    Activity Tolerance Patient tolerated treatment well    Behavior During Therapy [facility] for tasks assessed/performed           Past Medical History:  Diagnosis Date  . Arthritis   . Depression   . Neuropathy     Past Surgical History:  Procedure Laterality Date  . COLONOSCOPY N/A 02/03/2019   Procedure: COLONOSCOPY;  Surgeon: Malissa Hippo, MD;  [facility];  Service: Endoscopy;  Laterality: N/A;  730   . TONSILLECTOMY AND ADENOIDECTOMY    . WISDOM TOOTH EXTRACTION      There were no vitals filed for this visit.   Subjective Assessment - 10/06/20 1120    Subjective  S: It's just the usual today.    Currently in Pain? Yes    Pain Score 4     Pain Location Shoulder    Pain Orientation Left    Pain Descriptors / Indicators Sore    Pain Type Acute pain    Pain Radiating Towards None    Pain Onset In the past 7 days    Pain Frequency Intermittent    Aggravating Factors  not sure    Pain Relieving Factors ice    Effect of Pain on Daily Activities mod effect on ADLs    Multiple Pain Sites No              [facility] OT Assessment - 10/06/20 1119      Assessment   Medical Diagnosis Left Proximal Humerus  Fracture      Precautions   Precautions Shoulder    Type of Shoulder Precautions standard protocol. BOFB5-1: P/ROM within pain tolerance. Week 3-5 (4/3-4/17): Begin AA/ROM if pain has deminished and it's tolerable. isometrics Week 6-8 (4/24-5/8): A/ROM Week 8-10 (5/8-5/22): light resistance strengthening                    OT Treatments/Exercises (OP) - 10/06/20 1120      Exercises   Exercises Shoulder      Shoulder Exercises: Supine   Protraction PROM;5 reps    Horizontal ABduction PROM;5 reps    External Rotation PROM;5 reps    Internal Rotation PROM;5 reps    Flexion PROM;5 reps    ABduction PROM;5 reps      Shoulder Exercises: Standing   Protraction AROM;10 reps    Horizontal ABduction AROM;10 reps    External Rotation AROM;10 reps    Internal Rotation AROM;10 reps    Flexion AROM;10 reps    ABduction AROM;10 reps      Shoulder Exercises: Stretch   Internal Rotation Stretch 2  reps   30" horizontal towel   External Rotation Stretch 2 reps;30 seconds    Wall Stretch - Flexion 2 reps;30 seconds    Wall Stretch - ABduction 2 reps;30 seconds      Functional Reaching Activities   High Level Pt removing and replacing boxes on top shelf of cabinet in ADL kitchen. Removing in flexion, replacing in abduction.      Manual Therapy   Manual Therapy Myofascial release;Other (comment);Muscle Energy Technique    Manual therapy comments manual therapy completed seperately than all other interventions this date    Myofascial Release myofascial release and manual stretching to left upper arm, scapular, and shoulder region to decrease pain and restrictions and improve mobility to [facility]    Other Manual Therapy Massage gun used on level 1 setting, small foam ball attachment focusing on scapular and teres regions to decrease fascial restrictions and increase joint mobility.    Muscle Energy Technique Muscle energy technique used to left shoulder flexors and external rotators to relax  tone and muscle spasm and improve range of motion.                  OT Education - 10/06/20 1147    Education Details A/ROM    Person(s) Educated Patient    Methods Explanation;Demonstration;Handout    Comprehension Verbalized understanding;Returned demonstration            OT Short Term Goals - 09/18/20 1144      OT SHORT TERM GOAL #1   Title Patient will be educated and independent with HEP in order to faciliate progress in therapy and allow him to return to using his LUE for all daily tasks.    Time 4    Period Weeks    Status On-going    Target Date 09/11/20      OT SHORT TERM GOAL #2   Title Patient will increase his LUE P/ROM to WNL in order to complete dressing tasks with less difficulty.    Time 4    Period Weeks    Status On-going      OT SHORT TERM GOAL #3   Title Patient will increase his LUE strength to 3/5 in order to complete wait level self care tasks without difficulty.    Time 4    Period Weeks    Status On-going      OT SHORT TERM GOAL #4   Title Patient will decrease the fascial restrictions in his left UE to mod amount in order to increase the functional mobility needed to complete low level reaching tasks.    Time 4    Period Weeks      OT SHORT TERM GOAL #5   Title Patient will report a decrease in pain in the LUE during basic ADL tasks of approximately 5/10 or less.    Time 4    Period Weeks             OT Long Term Goals - 09/14/20 1339      OT LONG TERM GOAL #1   Title Patient will increase his LUE A/ROM to WNL while completing all high level reaching tasks as needed without difficulty.    Time 8    Period Weeks    Status On-going      OT LONG TERM GOAL #2   Title Patient will increase his LUE strength to 5/5 in order to return to desired workout routine such as running.    Time 8  Period Weeks    Status On-going      OT LONG TERM GOAL #3   Title Patient will report a decrease in pain level of approximately 3/10 or  less when utilizing his LUE to complete all daily tasks.    Time 8    Period Weeks    Status On-going      OT LONG TERM GOAL #4   Title Patient will decrease his left UE fascial restrictions to min amount or less in order to increase the functional mobility needed to complete reaching tasks.    Time 8    Period Weeks    Status On-going                 Plan - 10/06/20 1145    Clinical Impression Statement A: Continued with manual techniques, trialed massage gun at scapular and teres regions, completed subscapularis release with good response. Continued with sustained shoulder stretches, A/ROM. Functional reaching task completed with pt reaching to overhead level. Verbal cuing for form and technique. Pt has difficulty with A/ROM above approximately 60% range, continues to have pain with end range stretching and functional reaching.    Body Structure / Function / Physical Skills ADL;UE functional use;Fascial restriction;Pain;ROM;Strength;Edema;Decreased knowledge of precautions;IADL    Plan P: Reassessment, recertification. Pt will benefit from continued skilled OT services to address functional use of LUE during ADLs    OT Home Exercise Plan 5/10: shoulder stretches; 6/3: A/ROM    Consulted and Agree with Plan of Care Patient           Patient will benefit from skilled therapeutic intervention in order to improve the following deficits and impairments:   Body Structure / Function / Physical Skills: ADL,UE functional use,Fascial restriction,Pain,ROM,Strength,Edema,Decreased knowledge of precautions,IADL       Visit Diagnosis: Acute pain of left shoulder  Stiffness of left shoulder, not elsewhere classified  Other symptoms and signs involving the musculoskeletal system    Problem List Patient Active Problem List   Diagnosis Date Noted  . Closed fracture of proximal end of left humerus with routine healing 07/27/2020  . Special screening for malignant neoplasms, colon  06/10/2018  . DDD (degenerative disc disease), cervical 04/06/2015  . Tinea pedis of left foot 01/04/2014  . Osteoarthritis of foot, left 01/04/2014  . Stiffness of joint, lower leg 06/29/2013  . Strain of hamstring muscle 06/29/2013  . Right hip pain 06/22/2013  . Sciatic nerve pain 04/11/2011  . Gait abnormality 09/26/2010  . STRESS FRACTURE, FOOT 04/24/2009  . MONONEURITIS, LEG 03/27/2009   Ezra Sites, OTR/L  [phone] 10/06/2020, 12:00 PM  [place] [address] [place], [place], [address] Phone: [phone]   Fax:  [phone]  Name: TREASURE INGRUM MRN: [id number] Date of Birth: 09/02/1955

## 2020-10-06 NOTE — Patient Instructions (Signed)

## 2020-10-10 ENCOUNTER — Encounter (HOSPITAL_COMMUNITY): Payer: Self-pay | Admitting: Occupational Therapy

## 2020-10-10 ENCOUNTER — Other Ambulatory Visit: Payer: Self-pay

## 2020-10-10 ENCOUNTER — Ambulatory Visit (HOSPITAL_COMMUNITY): Payer: Medicare PPO | Admitting: Occupational Therapy

## 2020-10-10 DIAGNOSIS — M25612 Stiffness of left shoulder, not elsewhere classified: Secondary | ICD-10-CM | POA: Diagnosis not present

## 2020-10-10 DIAGNOSIS — M25512 Pain in left shoulder: Secondary | ICD-10-CM | POA: Diagnosis not present

## 2020-10-10 DIAGNOSIS — R29898 Other symptoms and signs involving the musculoskeletal system: Secondary | ICD-10-CM

## 2020-10-10 NOTE — Therapy (Signed)
[place], [place], [address] Phone: [phone]   Fax:  [phone]  Occupational Therapy Reassessment, Treatment Recertification  Patient Details  Name: Clifford Harris MRN: [id number] Date of Birth: 04/21/1956 Referring Provider (OT): Arther Abbott, MD     [facility] OT Assessment - 10/10/20 [phone]      Assessment   Medical Diagnosis Left Proximal Humerus Fracture      Precautions   Precautions Shoulder    Type of Shoulder Precautions standard protocol. OTLX7-2: P/ROM within pain tolerance. Week 3-5 (4/3-4/17): Begin AA/ROM if pain has deminished and it's tolerable. isometrics Week 6-8 (4/24-5/8): A/ROM Week 8-10 (5/8-5/22): light resistance strengthening      Palpation   Palpation comment Mod-max fascial restrictions noted in left upper arm, upper trapezius, and scapularis region.      AROM   Overall AROM Comments assessed in supine, external and internal rotation with shoulder adducted    AROM Assessment Site Shoulder    Right/Left Shoulder Left    Left Shoulder Flexion 105 Degrees   same as previous   Left Shoulder ABduction 75 Degrees   same as previous   Left Shoulder Internal Rotation 90 Degrees   same as previous   Left Shoulder External Rotation 40 Degrees   same as previous     PROM   Overall PROM Comments Assessed supine. IR/er adducted    PROM Assessment Site Shoulder    Right/Left Shoulder Left    Left Shoulder Flexion 129 Degrees   125 previous   Left Shoulder ABduction 138 Degrees   100 previous   Left Shoulder Internal Rotation 90 Degrees   same as previous   Left Shoulder External Rotation 40 Degrees   55 previous     Strength   Overall Strength Comments assessed seated, er/IR adducted    Strength Assessment Site Shoulder    Right/Left Shoulder Left    Left Shoulder Flexion 4/5   not previously assessed   Left Shoulder ABduction 3-/5   not previously assessed   Left Shoulder Internal  Rotation 5/5   not previously assessed   Left Shoulder External Rotation 4-/5   not previously assessed             Encounter Date: 10/10/2020   OT End of Session - 10/10/20 1145    Visit Number 16    Number of Visits 24    Date for OT Re-Evaluation 11/09/20    Authorization Type [employer]    Authorization Time Period 8 visits aproved 10/10/20-11/18/20    Authorization - Visit Number 1    Authorization - Number of Visits 8    Progress Note Due on Visit 20    OT Start Time 0947    OT Stop Time 1029    OT Time Calculation (min) 42 min    Activity Tolerance Patient tolerated treatment well    Behavior During Therapy [facility] for tasks assessed/performed           Past Medical History:  Diagnosis Date  . Arthritis   . Depression   . Neuropathy     Past Surgical History:  Procedure Laterality Date  . COLONOSCOPY N/A 02/03/2019   Procedure: COLONOSCOPY;  Surgeon: Rogene Houston, MD;  [facility];  Service: Endoscopy;  Laterality: N/A;  730   . TONSILLECTOMY AND ADENOIDECTOMY    . WISDOM TOOTH EXTRACTION      There were no vitals filed for this visit.  Subjective Assessment - 10/10/20 0947    Subjective  S: It wakes me up at night.    Currently in Pain? Yes    Pain Score 4     Pain Location Shoulder    Pain Orientation Left    Pain Descriptors / Indicators Sore    Pain Type Acute pain    Pain Radiating Towards None    Pain Onset In the past 7 days    Pain Frequency Intermittent    Aggravating Factors  not sure    Pain Relieving Factors ice    Effect of Pain on Daily Activities mod effect on ADLs    Multiple Pain Sites No                     OT Treatments/Exercises (OP) - 10/10/20 0949      Exercises   Exercises Shoulder      Shoulder Exercises: Supine   Protraction PROM;5 reps    Horizontal ABduction PROM;5 reps    External Rotation PROM;5 reps    Internal Rotation PROM;5 reps    Flexion PROM;5 reps    ABduction PROM;5 reps       Shoulder Exercises: Standing   Protraction AROM;10 reps    Horizontal ABduction AROM;10 reps    External Rotation AROM;10 reps    Internal Rotation AROM;10 reps    Flexion AROM;10 reps    ABduction AROM;10 reps      Shoulder Exercises: ROM/Strengthening   Other ROM/Strengthening Exercises ball pass behind back for IR, behind head for er, 10X each using tennis balls      Shoulder Exercises: Stretch   Wall Stretch - Flexion 2 reps;30 seconds    Wall Stretch - ABduction 2 reps;30 seconds      Manual Therapy   Manual Therapy Myofascial release;Muscle Energy Technique    Manual therapy comments manual therapy completed seperately than all other interventions this date    Myofascial Release myofascial release and manual stretching to left upper arm, scapular, and shoulder region to decrease pain and restrictions and improve mobility to [facility]    Muscle Energy Technique Muscle energy technique used to left shoulder flexors and external rotators to relax tone and muscle spasm and improve range of motion.                    OT Short Term Goals - 10/10/20 1016      OT SHORT TERM GOAL #1   Title Patient will be educated and independent with HEP in order to faciliate progress in therapy and allow him to return to using his LUE for all daily tasks.    Time 4    Period Weeks    Status On-going    Target Date 09/11/20      OT SHORT TERM GOAL #2   Title Patient will increase his LUE P/ROM to WNL in order to complete dressing tasks with less difficulty.    Time 4    Period Weeks    Status On-going      OT SHORT TERM GOAL #3   Title Patient will increase his LUE strength to 3/5 in order to complete wait level self care tasks without difficulty.    Time 4    Period Weeks    Status Achieved      OT SHORT TERM GOAL #4   Title Patient will decrease the fascial restrictions in his left UE to mod amount in order to increase the functional mobility  needed to complete low level  reaching tasks.    Time 4    Period Weeks    Status Partially Met      OT SHORT TERM GOAL #5   Title Patient will report a decrease in pain in the LUE during basic ADL tasks of approximately 5/10 or less.    Time 4    Period Weeks    Status Achieved             OT Long Term Goals - 09/14/20 1339      OT LONG TERM GOAL #1   Title Patient will increase his LUE A/ROM to WNL while completing all high level reaching tasks as needed without difficulty.    Time 8    Period Weeks    Status On-going      OT LONG TERM GOAL #2   Title Patient will increase his LUE strength to 5/5 in order to return to desired workout routine such as running.    Time 8    Period Weeks    Status On-going      OT LONG TERM GOAL #3   Title Patient will report a decrease in pain level of approximately 3/10 or less when utilizing his LUE to complete all daily tasks.    Time 8    Period Weeks    Status On-going      OT LONG TERM GOAL #4   Title Patient will decrease his left UE fascial restrictions to min amount or less in order to increase the functional mobility needed to complete reaching tasks.    Time 8    Period Weeks    Status On-going                 Plan - 10/10/20 1017    Clinical Impression Statement A: Reassessment completed this session, pt has met 2/5 STGs and partially met an additional STG. Pt's progress has been slow due to complications of pain and ROM restrictions with tight end feel of joint capsule, question development of adhesive capsulitis. Pt reports he has been using his LUE during ADLs and is limited in functional reaching tasks. He did ride his bike this week with soreness afterwards from holding the handlebars. Continued with myofascial release, passive stretching, and A/ROM today. Verbal cuing for form and technique.    OT Occupational Profile and History Problem Focused Assessment - Including review of records relating to presenting problem    Occupational  performance deficits (Please refer to evaluation for details): ADL's;Leisure;Rest and Sleep;IADL's    Body Structure / Function / Physical Skills ADL;UE functional use;Fascial restriction;Pain;ROM;Strength;Edema;Decreased knowledge of precautions;IADL    Rehab Potential Excellent    Clinical Decision Making Limited treatment options, no task modification necessary    Comorbidities Affecting Occupational Performance: None    Modification or Assistance to Complete Evaluation  No modification of tasks or assist necessary to complete eval    OT Frequency 2x / week    OT Duration 4 weeks    OT Treatment/Interventions Self-care/ADL training;Ultrasound;Patient/family education;DME and/or AE instruction;Passive range of motion;Cryotherapy;Electrical Stimulation;Moist Heat;Neuromuscular education;Therapeutic exercise;Manual Therapy;Therapeutic activities    Plan P: Pt will benefit from continued skilled OT services to decrease pain and fascial restrictions, increase joint ROM, strength, and functional use. Next session: follow up on MD appt, continue with sustained stretching, functional reaching    [place] 5/10: shoulder stretches; 6/3: A/ROM    Consulted and Agree with Plan of Care Patient  Patient will benefit from skilled therapeutic intervention in order to improve the following deficits and impairments:   Body Structure / Function / Physical Skills: ADL,UE functional use,Fascial restriction,Pain,ROM,Strength,Edema,Decreased knowledge of precautions,IADL       Visit Diagnosis: Acute pain of left shoulder  Stiffness of left shoulder, not elsewhere classified  Other symptoms and signs involving the musculoskeletal system    Problem List Patient Active Problem List   Diagnosis Date Noted  . Closed fracture of proximal end of left humerus with routine healing 07/27/2020  . Special screening for malignant neoplasms, colon 06/10/2018  . DDD (degenerative disc  disease), cervical 04/06/2015  . Tinea pedis of left foot 01/04/2014  . Osteoarthritis of foot, left 01/04/2014  . Stiffness of joint, lower leg 06/29/2013  . Strain of hamstring muscle 06/29/2013  . Right hip pain 06/22/2013  . Sciatic nerve pain 04/11/2011  . Gait abnormality 09/26/2010  . STRESS FRACTURE, FOOT 04/24/2009  . MONONEURITIS, LEG 03/27/2009   Guadelupe Sabin, OTR/L  [phone] 10/10/2020, 11:46 AM  [place], [place], [address] Phone: [phone]   Fax:  [phone]  Name: Clifford Harris MRN: [id number] Date of Birth: [birth year]

## 2020-10-12 ENCOUNTER — Ambulatory Visit (HOSPITAL_COMMUNITY): Payer: Medicare PPO | Admitting: Occupational Therapy

## 2020-10-12 ENCOUNTER — Other Ambulatory Visit: Payer: Self-pay

## 2020-10-12 ENCOUNTER — Encounter (HOSPITAL_COMMUNITY): Payer: Self-pay | Admitting: Occupational Therapy

## 2020-10-12 ENCOUNTER — Encounter: Payer: Self-pay | Admitting: Orthopedic Surgery

## 2020-10-12 ENCOUNTER — Ambulatory Visit (INDEPENDENT_AMBULATORY_CARE_PROVIDER_SITE_OTHER): Payer: Medicare PPO | Admitting: Orthopedic Surgery

## 2020-10-12 DIAGNOSIS — R29898 Other symptoms and signs involving the musculoskeletal system: Secondary | ICD-10-CM

## 2020-10-12 DIAGNOSIS — M25512 Pain in left shoulder: Secondary | ICD-10-CM

## 2020-10-12 DIAGNOSIS — M25612 Stiffness of left shoulder, not elsewhere classified: Secondary | ICD-10-CM | POA: Diagnosis not present

## 2020-10-12 DIAGNOSIS — S42295D Other nondisplaced fracture of upper end of left humerus, subsequent encounter for fracture with routine healing: Secondary | ICD-10-CM

## 2020-10-12 MED ORDER — HYDROCODONE-ACETAMINOPHEN 5-325 MG PO TABS: 1.0000 | ORAL_TABLET | Freq: Every evening | ORAL | 0 refills | Status: DC

## 2020-10-12 MED ORDER — CELECOXIB 200 MG PO CAPS: 200.0000 mg | ORAL_CAPSULE | Freq: Two times a day (BID) | ORAL | 1 refills | Status: DC

## 2020-10-12 NOTE — Patient Instructions (Signed)
START PULLEY EXERCISES  TAKE 1 NORCO AT NIGHT   AND CELEBREX TWICE A DAY

## 2020-10-12 NOTE — Progress Notes (Signed)
Fracture care follow up   Chief Complaint  Patient presents with   Shoulder Injury    07/16/20 improving     FOV 07/19/20  FRACTURE CARE DAY 85   Clifford Harris is improving but not as fast as we would like,  having some aching pain during the day and then its worse at night and is causing some difficulty sleeping  His clinical exam today bears out the notes we note below where he has better passive than active motion  He does have this greater tuberosity fracture involvement with his proximal humerus fracture which may be causing some of his decreased range of motion however after looking at the x-ray again I do not think that is the cause.  I agree with his therapist that he is at risk for adhesive capsulitis  I did give him a subacromial injection I started him on Celebrex twice a day and I gave him hydrocodone 5 mg to take at night  Follow-up in 6 weeks  PT notes are listed below  A: Reassessment completed this session, pt has met 2/5 STGs and partially met an additional STG. Pt's progress has been slow due to complications of pain and ROM restrictions with tight end feel of joint capsule, question development of adhesive capsulitis. Pt reports he has been using his LUE during ADLs and is limited in functional reaching tasks. He did ride his bike this week with soreness afterwards from holding the handlebars. Continued with myofascial release, passive stretching, and A/ROM today. Verbal cuing for form and technique.    AROM     Overall AROM Comments assessed in supine, external and internal rotation with shoulder adducted    AROM Assessment Site Shoulder    Right/Left Shoulder Left    Left Shoulder Flexion 105 Degrees   same as previous    Left Shoulder ABduction 75 Degrees   same as previous    Left Shoulder Internal Rotation 90 Degrees   same as previous    Left Shoulder External Rotation 40 Degrees   same as previous   PROM   Overall PROM Comments Assessed supine. IR/er  adducted   PROM Assessment Site Shoulder   Right/Left Shoulder Left   Left Shoulder Flexion 129 Degrees   125 previous   Left Shoulder ABduction 138 Degrees   100 previous   Left Shoulder Internal Rotation 90 Degrees   same as previous   Left Shoulder External Rotation 40 Degrees   55 previous     Strength   Overall Strength Comments assessed seated, er/IR adducted   Strength Assessment Site Shoulder   Right/Left Shoulder Left   Left Shoulder Flexion 4/5   not previously assessed   Left Shoulder ABduction 3-/5   not previously assessed   Left Shoulder Internal Rotation 5/5   not previously assessed   Left Shoulder External Rotation 4-/5   not previously assessed    A steroid injection was performed at [place]  using 1% plain Lidocaine and 40 mg of DEPOMEDROL. This was well tolerated.    Meds ordered this encounter  Medications   celecoxib (CELEBREX) 200 MG capsule    Sig: Take 1 capsule (200 mg total) by mouth 2 (two) times daily.    Dispense:  60 capsule    Refill:  1   HYDROcodone-acetaminophen (NORCO/VICODIN) 5-325 MG tablet    Sig: Take 1 tablet by mouth at bedtime.    Dispense:  30 tablet    Refill:  0

## 2020-10-12 NOTE — Therapy (Signed)
[place], [place], [address] Phone: [phone]   Fax:  [phone]  Occupational Therapy Treatment  Patient Details  Name: Clifford Harris MRN: [id number] Date of Birth: Sep 26, 1955 Referring Provider (OT): Arther Abbott, MD   Encounter Date: 10/12/2020   OT End of Session - 10/12/20 1511     Visit Number 17    Number of Visits 24    Date for OT Re-Evaluation 11/09/20    Authorization Type [employer]    Authorization Time Period 8 visits aproved 10/10/20-11/18/20    Authorization - Visit Number 2    Authorization - Number of Visits 8    Progress Note Due on Visit 20    OT Start Time 1430    OT Stop Time 1511    OT Time Calculation (min) 41 min    Activity Tolerance Patient tolerated treatment well    Behavior During Therapy [facility] for tasks assessed/performed             Past Medical History:  Diagnosis Date   Arthritis    Depression    Neuropathy     Past Surgical History:  Procedure Laterality Date   COLONOSCOPY N/A 02/03/2019   Procedure: COLONOSCOPY;  Surgeon: Rogene Houston, MD;  [facility];  Service: Endoscopy;  Laterality: N/A;  730    TONSILLECTOMY AND ADENOIDECTOMY     WISDOM TOOTH EXTRACTION      There were no vitals filed for this visit.   Subjective Assessment - 10/12/20 1432     Subjective  S: The doctor gave me a shot.    Currently in Pain? Yes    Pain Score 5     Pain Location Shoulder    Pain Orientation Left    Pain Descriptors / Indicators Sore    Pain Type Acute pain    Pain Radiating Towards None    Pain Onset 1 to 4 weeks ago    Pain Frequency Intermittent    Aggravating Factors  getting a shot today    Pain Relieving Factors ice    Effect of Pain on Daily Activities mod effect on ADLs    Multiple Pain Sites No                [facility] OT Assessment - 10/12/20 1431       Assessment   Medical Diagnosis Left Proximal Humerus Fracture       Precautions   Precautions Shoulder    Type of Shoulder Precautions progress as tolerated                      OT Treatments/Exercises (OP) - 10/12/20 1431       Exercises   Exercises Shoulder      Shoulder Exercises: Supine   Protraction PROM;5 reps    Horizontal ABduction PROM;5 reps    External Rotation PROM;5 reps    Internal Rotation PROM;5 reps    Flexion PROM;5 reps    ABduction PROM;5 reps      Shoulder Exercises: Standing   Protraction AROM;10 reps    Horizontal ABduction AROM;10 reps    External Rotation AROM;10 reps    Internal Rotation AROM;10 reps    Flexion AROM;10 reps    ABduction AROM;10 reps      Shoulder Exercises: ROM/Strengthening   UBE (Upper Arm Bike) level 1 2' forward 2' reverse, pace: 5.0    Wall Wash 1'    Other  ROM/Strengthening Exercises ball pass behind back for IR, behind head for er, 10X each using tennis balls      Shoulder Exercises: Stretch   Other Shoulder Stretches countertop stretch, 2x20"      Functional Reaching Activities   High Level pt placing 20 clothespins along vertical bar of pinch tree in flexion.      Manual Therapy   Manual Therapy Myofascial release;Muscle Energy Technique    Manual therapy comments manual therapy completed seperately than all other interventions this date    Myofascial Release myofascial release and manual stretching to left upper arm, scapular, and shoulder region to decrease pain and restrictions and improve mobility to [facility]    Muscle Energy Technique Muscle energy technique used to left shoulder flexors and external rotators to relax tone and muscle spasm and improve range of motion.                      OT Short Term Goals - 10/10/20 1016       OT SHORT TERM GOAL #1   Title Patient will be educated and independent with HEP in order to faciliate progress in therapy and allow him to return to using his LUE for all daily tasks.    Time 4    Period Weeks    Status On-going     Target Date 09/11/20      OT SHORT TERM GOAL #2   Title Patient will increase his LUE P/ROM to WNL in order to complete dressing tasks with less difficulty.    Time 4    Period Weeks    Status On-going      OT SHORT TERM GOAL #3   Title Patient will increase his LUE strength to 3/5 in order to complete wait level self care tasks without difficulty.    Time 4    Period Weeks    Status Achieved      OT SHORT TERM GOAL #4   Title Patient will decrease the fascial restrictions in his left UE to mod amount in order to increase the functional mobility needed to complete low level reaching tasks.    Time 4    Period Weeks    Status Partially Met      OT SHORT TERM GOAL #5   Title Patient will report a decrease in pain in the LUE during basic ADL tasks of approximately 5/10 or less.    Time 4    Period Weeks    Status Achieved               OT Long Term Goals - 09/14/20 1339       OT LONG TERM GOAL #1   Title Patient will increase his LUE A/ROM to WNL while completing all high level reaching tasks as needed without difficulty.    Time 8    Period Weeks    Status On-going      OT LONG TERM GOAL #2   Title Patient will increase his LUE strength to 5/5 in order to return to desired workout routine such as running.    Time 8    Period Weeks    Status On-going      OT LONG TERM GOAL #3   Title Patient will report a decrease in pain level of approximately 3/10 or less when utilizing his LUE to complete all daily tasks.    Time 8    Period Weeks    Status On-going  OT LONG TERM GOAL #4   Title Patient will decrease his left UE fascial restrictions to min amount or less in order to increase the functional mobility needed to complete reaching tasks.    Time 8    Period Weeks    Status On-going                   Plan - 10/12/20 1453     Clinical Impression Statement A: Pt reports MD gave him a steroid injection today. Myofascial release to left upper arm  and anterior shoulder, gentle myofascial release to upper trapezius to address fascial restrictions. Continued with passive stretching, A/ROM in standing. Added countertop stretch, continued with wall wash with pt reaching overhead height, continued with functional reaching. Verbal cuing for form and technique during exercises.    Body Structure / Function / Physical Skills ADL;UE functional use;Fascial restriction;Pain;ROM;Strength;Edema;Decreased knowledge of precautions;IADL    Plan P: Follow up on HEP from vacation week, continue with A/ROM and attempt scapular theraband, functional reaching tasks    OT Home Exercise Plan 5/10: shoulder stretches; 6/3: A/ROM    Consulted and Agree with Plan of Care Patient             Patient will benefit from skilled therapeutic intervention in order to improve the following deficits and impairments:   Body Structure / Function / Physical Skills: ADL, UE functional use, Fascial restriction, Pain, ROM, Strength, Edema, Decreased knowledge of precautions, IADL       Visit Diagnosis: Acute pain of left shoulder  Stiffness of left shoulder, not elsewhere classified  Other symptoms and signs involving the musculoskeletal system    Problem List Patient Active Problem List   Diagnosis Date Noted   Closed fracture of proximal end of left humerus with routine healing 07/27/2020   Special screening for malignant neoplasms, colon 06/10/2018   DDD (degenerative disc disease), cervical 04/06/2015   Tinea pedis of left foot 01/04/2014   Osteoarthritis of foot, left 01/04/2014   Stiffness of joint, lower leg 06/29/2013   Strain of hamstring muscle 06/29/2013   Right hip pain 06/22/2013   Sciatic nerve pain 04/11/2011   Gait abnormality 09/26/2010   STRESS FRACTURE, FOOT 04/24/2009   MONONEURITIS, LEG 03/27/2009   Guadelupe Sabin, OTR/L  [phone] 10/12/2020, 3:12 PM  [place], [place], [address] Phone: [phone]   Fax:  [phone]  Name: Clifford Harris MRN: [id number] Date of Birth: [birth year]

## 2020-10-17 ENCOUNTER — Encounter (HOSPITAL_COMMUNITY): Payer: Medicare PPO | Admitting: Occupational Therapy

## 2020-10-19 ENCOUNTER — Encounter (HOSPITAL_COMMUNITY): Payer: Medicare PPO | Admitting: Occupational Therapy

## 2020-10-24 ENCOUNTER — Ambulatory Visit (HOSPITAL_COMMUNITY): Payer: Medicare PPO

## 2020-10-24 ENCOUNTER — Other Ambulatory Visit: Payer: Self-pay

## 2020-10-24 ENCOUNTER — Encounter (HOSPITAL_COMMUNITY): Payer: Self-pay

## 2020-10-24 DIAGNOSIS — M25512 Pain in left shoulder: Secondary | ICD-10-CM

## 2020-10-24 DIAGNOSIS — R29898 Other symptoms and signs involving the musculoskeletal system: Secondary | ICD-10-CM

## 2020-10-24 DIAGNOSIS — M25612 Stiffness of left shoulder, not elsewhere classified: Secondary | ICD-10-CM | POA: Diagnosis not present

## 2020-10-24 NOTE — Therapy (Signed)
[place], [place], [address] Phone: [phone]   Fax:  [phone]  Occupational Therapy Treatment  Patient Details  Name: Clifford Harris MRN: [id number] Date of Birth: [birth year] Referring Provider (OT): Arther Abbott, MD   Encounter Date: 10/24/2020   OT End of Session - 10/24/20 1625     Visit Number 18    Number of Visits 24    Date for OT Re-Evaluation 11/09/20    Authorization Type [employer]    Authorization Time Period 8 visits aproved 10/10/20-11/18/20    Authorization - Visit Number 3    Authorization - Number of Visits 8    Progress Note Due on Visit 20    OT Start Time 1305    OT Stop Time 1343    OT Time Calculation (min) 38 min    Activity Tolerance Patient tolerated treatment well    Behavior During Therapy [facility] for tasks assessed/performed             Past Medical History:  Diagnosis Date   Arthritis    Depression    Neuropathy     Past Surgical History:  Procedure Laterality Date   COLONOSCOPY N/A 02/03/2019   Procedure: COLONOSCOPY;  Surgeon: Rogene Houston, MD;  [facility];  Service: Endoscopy;  Laterality: N/A;  730    TONSILLECTOMY AND ADENOIDECTOMY     WISDOM TOOTH EXTRACTION      There were no vitals filed for this visit.   Subjective Assessment - 10/24/20 1621     Subjective  S: I can tell it doesn't hurt as bad.    Currently in Pain? Yes    Pain Score 3     Pain Location Shoulder    Pain Orientation Left    Pain Descriptors / Indicators Sore    Pain Type Acute pain    Pain Onset 1 to 4 weeks ago    Pain Frequency Intermittent                [facility] OT Assessment - 10/24/20 1622       Assessment   Medical Diagnosis Left Proximal Humerus Fracture      Precautions   Precautions Shoulder    Type of Shoulder Precautions progress as tolerated                      OT Treatments/Exercises (OP) - 10/24/20 1622        Exercises   Exercises Shoulder      Shoulder Exercises: Supine   Protraction PROM;5 reps    Horizontal ABduction PROM;5 reps    External Rotation PROM;5 reps   abducted   Internal Rotation PROM;5 reps   abducted   Flexion PROM;5 reps    ABduction PROM;5 reps      Modalities   Modalities Ultrasound      Ultrasound   Ultrasound Location left shoulder   arm abducted and externally rotated within comfort level   Ultrasound Parameters 1.5 W/cm2    Ultrasound Goals Other (Comment)   decrease adhesions     Manual Therapy   Manual Therapy Soft tissue mobilization    Manual therapy comments manual therapy completed seperately than all other interventions this date    Soft tissue mobilization soft tissue mobilization completed during passive stretching to increase LUE ROM within tolerable pain zone.  OT Short Term Goals - 10/24/20 1630       OT SHORT TERM GOAL #1   Title Patient will be educated and independent with HEP in order to faciliate progress in therapy and allow him to return to using his LUE for all daily tasks.    Time 4    Period Weeks    Status On-going    Target Date 09/11/20      OT SHORT TERM GOAL #2   Title Patient will increase his LUE P/ROM to WNL in order to complete dressing tasks with less difficulty.    Time 4    Period Weeks    Status On-going      OT SHORT TERM GOAL #3   Title Patient will increase his LUE strength to 3/5 in order to complete wait level self care tasks without difficulty.    Time 4    Period Weeks      OT SHORT TERM GOAL #4   Title Patient will decrease the fascial restrictions in his left UE to mod amount in order to increase the functional mobility needed to complete low level reaching tasks.    Time 4    Period Weeks    Status Partially Met      OT SHORT TERM GOAL #5   Title Patient will report a decrease in pain in the LUE during basic ADL tasks of approximately 5/10 or less.    Time 4     Period Weeks               OT Long Term Goals - 09/14/20 1339       OT LONG TERM GOAL #1   Title Patient will increase his LUE A/ROM to WNL while completing all high level reaching tasks as needed without difficulty.    Time 8    Period Weeks    Status On-going      OT LONG TERM GOAL #2   Title Patient will increase his LUE strength to 5/5 in order to return to desired workout routine such as running.    Time 8    Period Weeks    Status On-going      OT LONG TERM GOAL #3   Title Patient will report a decrease in pain level of approximately 3/10 or less when utilizing his LUE to complete all daily tasks.    Time 8    Period Weeks    Status On-going      OT LONG TERM GOAL #4   Title Patient will decrease his left UE fascial restrictions to min amount or less in order to increase the functional mobility needed to complete reaching tasks.    Time 8    Period Weeks    Status On-going                   Plan - 10/24/20 1626     Clinical Impression Statement A: Discussed disappointment with progress and recovery of his LUE. Pt was hoping he would be better by now. Provided encouragement and reviewed timeline for receovery usually takes about a year. Discussed frozen shoulder and 3 stages that occur with estimate of timeframe of each stage. Pt requested to decrease therapy to once a week for now. Requested that patient increase his frequency of shoulder stretches at home to 2-3 times a day versus once a day. [place] was used prior to passive stretching to help decrease adhesions. After use of [place], patient's arm presented with  less muscle spasms and resistance at end stretch. Pt verbalized being able to tell a difference.    Body Structure / Function / Physical Skills ADL;UE functional use;Fascial restriction;Pain;ROM;Strength;Edema;Decreased knowledge of precautions;IADL    Plan P: [place] prior to stretching    Consulted and Agree with Plan of Care Patient             Patient  will benefit from skilled therapeutic intervention in order to improve the following deficits and impairments:   Body Structure / Function / Physical Skills: ADL, UE functional use, Fascial restriction, Pain, ROM, Strength, Edema, Decreased knowledge of precautions, IADL       Visit Diagnosis: Stiffness of left shoulder, not elsewhere classified  Other symptoms and signs involving the musculoskeletal system  Acute pain of left shoulder    Problem List Patient Active Problem List   Diagnosis Date Noted   Closed fracture of proximal end of left humerus with routine healing 07/27/2020   Special screening for malignant neoplasms, colon 06/10/2018   DDD (degenerative disc disease), cervical 04/06/2015   Tinea pedis of left foot 01/04/2014   Osteoarthritis of foot, left 01/04/2014   Stiffness of joint, lower leg 06/29/2013   Strain of hamstring muscle 06/29/2013   Right hip pain 06/22/2013   Sciatic nerve pain 04/11/2011   Gait abnormality 09/26/2010   STRESS FRACTURE, FOOT 04/24/2009   MONONEURITIS, LEG 03/27/2009    Ailene Ravel, OTR/L,CBIS  [phone]   10/24/2020, 4:31 PM  [place] [address] [place], [place], [address] Phone: [phone]   Fax:  [phone]  Name: IZAC FAULKENBERRY MRN: [id number] Date of Birth: 1955/05/29

## 2020-10-26 ENCOUNTER — Ambulatory Visit (HOSPITAL_COMMUNITY): Payer: Medicare PPO

## 2020-10-31 ENCOUNTER — Encounter (HOSPITAL_COMMUNITY): Payer: Medicare PPO | Admitting: Occupational Therapy

## 2020-10-31 ENCOUNTER — Ambulatory Visit (HOSPITAL_COMMUNITY): Payer: Medicare PPO | Admitting: Occupational Therapy

## 2020-10-31 ENCOUNTER — Other Ambulatory Visit: Payer: Self-pay

## 2020-10-31 ENCOUNTER — Encounter (HOSPITAL_COMMUNITY): Payer: Self-pay | Admitting: Occupational Therapy

## 2020-10-31 DIAGNOSIS — M25512 Pain in left shoulder: Secondary | ICD-10-CM | POA: Diagnosis not present

## 2020-10-31 DIAGNOSIS — M25612 Stiffness of left shoulder, not elsewhere classified: Secondary | ICD-10-CM | POA: Diagnosis not present

## 2020-10-31 DIAGNOSIS — R29898 Other symptoms and signs involving the musculoskeletal system: Secondary | ICD-10-CM

## 2020-10-31 NOTE — Therapy (Signed)
[place], [place], [address] Phone: [phone]   Fax:  [phone]  Occupational Therapy Treatment  Patient Details  Name: Clifford Harris MRN: [id number] Date of Birth: Mar 18, 1956 Referring Provider (OT): Arther Abbott, MD   Encounter Date: 10/31/2020   OT End of Session - 10/31/20 1341     Visit Number 19    Number of Visits 24    Date for OT Re-Evaluation 11/09/20    Authorization Type [employer]    Authorization Time Period 8 visits aproved 10/10/20-11/18/20    Authorization - Visit Number 4    Authorization - Number of Visits 8    Progress Note Due on Visit 20    OT Start Time 1300    OT Stop Time 1340    OT Time Calculation (min) 40 min    Activity Tolerance Patient tolerated treatment well    Behavior During Therapy [facility] for tasks assessed/performed             Past Medical History:  Diagnosis Date   Arthritis    Depression    Neuropathy     Past Surgical History:  Procedure Laterality Date   COLONOSCOPY N/A 02/03/2019   Procedure: COLONOSCOPY;  Surgeon: Rogene Houston, MD;  [facility];  Service: Endoscopy;  Laterality: N/A;  730    TONSILLECTOMY AND ADENOIDECTOMY     WISDOM TOOTH EXTRACTION      There were no vitals filed for this visit.   Subjective Assessment - 10/31/20 1301     Subjective  S: I haven't done my exercises 3 times a day like I'm supposed to.    Currently in Pain? No/denies                [facility] OT Assessment - 10/31/20 1301       Assessment   Medical Diagnosis Left Proximal Humerus Fracture      Precautions   Precautions Shoulder    Type of Shoulder Precautions progress as tolerated                      OT Treatments/Exercises (OP) - 10/31/20 1302       Exercises   Exercises Shoulder      Shoulder Exercises: Supine   Protraction PROM;5 reps    Horizontal ABduction PROM;5 reps    External Rotation PROM;5 reps    abducted   Internal Rotation PROM;5 reps   abducted   Flexion PROM;5 reps    ABduction PROM;5 reps      Shoulder Exercises: ROM/Strengthening   Other ROM/Strengthening Exercises ball pass behind back for IR, behind head for er, 10X each using tennis balls      Shoulder Exercises: Stretch   Internal Rotation Stretch 2 reps   30" holds   External Rotation Stretch 2 reps;30 seconds    Wall Stretch - Flexion 2 reps;30 seconds    Wall Stretch - ABduction 2 reps;30 seconds    Other Shoulder Stretches countertop stretch, 2x30"      Functional Reaching Activities   High Level Pt removing items from top shelf in kitchen, then placing items from middle shelf to top shelf, and then replacing items onto middle shelf. Also completing number board, reaching overhead to remove and place numbers.      Modalities   Modalities Ultrasound      Ultrasound   Ultrasound Location left shoulder   abducted and externally rotated to comfort  level   Ultrasound Parameters 1.5 W/cm2    Ultrasound Goals Other (Comment)   decrease adhesions                     OT Short Term Goals - 10/24/20 1630       OT SHORT TERM GOAL #1   Title Patient will be educated and independent with HEP in order to faciliate progress in therapy and allow him to return to using his LUE for all daily tasks.    Time 4    Period Weeks    Status On-going    Target Date 09/11/20      OT SHORT TERM GOAL #2   Title Patient will increase his LUE P/ROM to WNL in order to complete dressing tasks with less difficulty.    Time 4    Period Weeks    Status On-going      OT SHORT TERM GOAL #3   Title Patient will increase his LUE strength to 3/5 in order to complete wait level self care tasks without difficulty.    Time 4    Period Weeks      OT SHORT TERM GOAL #4   Title Patient will decrease the fascial restrictions in his left UE to mod amount in order to increase the functional mobility needed to complete low level  reaching tasks.    Time 4    Period Weeks    Status Partially Met      OT SHORT TERM GOAL #5   Title Patient will report a decrease in pain in the LUE during basic ADL tasks of approximately 5/10 or less.    Time 4    Period Weeks               OT Long Term Goals - 09/14/20 1339       OT LONG TERM GOAL #1   Title Patient will increase his LUE A/ROM to WNL while completing all high level reaching tasks as needed without difficulty.    Time 8    Period Weeks    Status On-going      OT LONG TERM GOAL #2   Title Patient will increase his LUE strength to 5/5 in order to return to desired workout routine such as running.    Time 8    Period Weeks    Status On-going      OT LONG TERM GOAL #3   Title Patient will report a decrease in pain level of approximately 3/10 or less when utilizing his LUE to complete all daily tasks.    Time 8    Period Weeks    Status On-going      OT LONG TERM GOAL #4   Title Patient will decrease his left UE fascial restrictions to min amount or less in order to increase the functional mobility needed to complete reaching tasks.    Time 8    Period Weeks    Status On-going                   Plan - 10/31/20 1341     Clinical Impression Statement A: Continued with [place] at beginning of session prior to passive stretching. Pt completing wall stretches during session, overhead reaching tasks including functional reaching into cabinets and with number board. Verbal cuing for form and technique. Encouraged pt to continue working towards completing stretches 3x/day.    Body Structure / Function / Physical Skills ADL;UE functional use;Fascial  restriction;Pain;ROM;Strength;Edema;Decreased knowledge of precautions;IADL    Plan P: Continue with [place] prior to stretching, follow up on increasing stretches to 3x/day    [place] 5/10: shoulder stretches; 6/3: A/ROM    Consulted and Agree with Plan of Care Patient             Patient  will benefit from skilled therapeutic intervention in order to improve the following deficits and impairments:   Body Structure / Function / Physical Skills: ADL, UE functional use, Fascial restriction, Pain, ROM, Strength, Edema, Decreased knowledge of precautions, IADL       Visit Diagnosis: Stiffness of left shoulder, not elsewhere classified  Other symptoms and signs involving the musculoskeletal system  Acute pain of left shoulder    Problem List Patient Active Problem List   Diagnosis Date Noted   Closed fracture of proximal end of left humerus with routine healing 07/27/2020   Special screening for malignant neoplasms, colon 06/10/2018   DDD (degenerative disc disease), cervical 04/06/2015   Tinea pedis of left foot 01/04/2014   Osteoarthritis of foot, left 01/04/2014   Stiffness of joint, lower leg 06/29/2013   Strain of hamstring muscle 06/29/2013   Right hip pain 06/22/2013   Sciatic nerve pain 04/11/2011   Gait abnormality 09/26/2010   STRESS FRACTURE, FOOT 04/24/2009   MONONEURITIS, LEG 03/27/2009    Guadelupe Sabin, OTR/L  [phone] 10/31/2020, 1:46 PM  [place] [address] [place], [place], [address] Phone: [phone]   Fax:  [phone]  Name: Clifford Harris MRN: [id number] Date of Birth: 11-03-1955

## 2020-11-02 ENCOUNTER — Encounter (HOSPITAL_COMMUNITY): Payer: Medicare PPO

## 2020-11-07 ENCOUNTER — Encounter (HOSPITAL_COMMUNITY): Payer: Medicare PPO | Admitting: Occupational Therapy

## 2020-11-07 ENCOUNTER — Other Ambulatory Visit: Payer: Self-pay

## 2020-11-07 ENCOUNTER — Encounter (HOSPITAL_COMMUNITY): Payer: Self-pay | Admitting: Occupational Therapy

## 2020-11-07 ENCOUNTER — Ambulatory Visit (HOSPITAL_COMMUNITY): Payer: Medicare PPO | Attending: Orthopedic Surgery | Admitting: Occupational Therapy

## 2020-11-07 DIAGNOSIS — M25512 Pain in left shoulder: Secondary | ICD-10-CM | POA: Insufficient documentation

## 2020-11-07 DIAGNOSIS — R29898 Other symptoms and signs involving the musculoskeletal system: Secondary | ICD-10-CM | POA: Insufficient documentation

## 2020-11-07 DIAGNOSIS — M25612 Stiffness of left shoulder, not elsewhere classified: Secondary | ICD-10-CM | POA: Diagnosis not present

## 2020-11-07 NOTE — Therapy (Signed)
[place], [place], [address] Phone: [phone]   Fax:  [phone]  Occupational Therapy Reassessment & Treatment (Recertification)  Patient Details  Name: Clifford Harris MRN: [id number] Date of Birth: October 29, 1955 Referring Provider (OT): Arther Abbott, MD  Progress Note Reporting Period 09/18/2020 to 11/07/2020  See note below for Objective Data and Assessment of Progress/Goals.         [facility] OT Assessment - 11/07/20 1255       Assessment   Medical Diagnosis Left Proximal Humerus Fracture      Precautions   Precautions Shoulder    Type of Shoulder Precautions progress as tolerated      Palpation   Palpation comment Mod fascial restrictions noted in left upper arm, upper trapezius, and scapularis region.      AROM   Overall AROM Comments assessed in supine, external and internal rotation with shoulder adducted    AROM Assessment Site Shoulder    Right/Left Shoulder Left    Left Shoulder Flexion 116 Degrees   105 previous   Left Shoulder ABduction 122 Degrees   75 previous   Left Shoulder Internal Rotation 90 Degrees   same as previous   Left Shoulder External Rotation 50 Degrees   40 previous     PROM   Overall PROM Comments Assessed supine. IR/er adducted    PROM Assessment Site Shoulder    Right/Left Shoulder Left    Left Shoulder Flexion 130 Degrees   129 previous   Left Shoulder ABduction 140 Degrees   138 previous   Left Shoulder Internal Rotation 90 Degrees   same as previous   Left Shoulder External Rotation 52 Degrees   40 previous     Strength   Overall Strength Comments assessed seated, er/IR adducted    Strength Assessment Site Shoulder    Right/Left Shoulder Left    Left Shoulder Flexion 5/5   4/5 previous   Left Shoulder ABduction 4+/5   3-/5 previous   Left Shoulder Internal Rotation 5/5   same as previous   Left Shoulder External Rotation 4/5   4-/5 previous              Encounter Date: 11/07/2020   OT End of Session - 11/07/20 1354     Visit Number 20    Number of Visits 24    Date for OT Re-Evaluation 12/07/20    Authorization Type [employer]    Authorization Time Period 8 visits aproved 10/10/20-11/18/20    Authorization - Visit Number 5    Authorization - Number of Visits 8    Progress Note Due on Visit 30    OT Start Time 1300    OT Stop Time 1346    OT Time Calculation (min) 46 min    Activity Tolerance Patient tolerated treatment well    Behavior During Therapy WFL for tasks assessed/performed             Past Medical History:  Diagnosis Date   Arthritis    Depression    Neuropathy     Past Surgical History:  Procedure Laterality Date   COLONOSCOPY N/A 02/03/2019   Procedure: COLONOSCOPY;  Surgeon: Rogene Houston, MD;  [facility];  Service: Endoscopy;  Laterality: N/A;  730    TONSILLECTOMY AND ADENOIDECTOMY     WISDOM TOOTH EXTRACTION      There were no vitals filed for this visit.   Subjective Assessment - 11/07/20 1300  Subjective  S: It was crackling constantly when I was pulling in the water hose.    Currently in Pain? Yes    Pain Score 4     Pain Location Shoulder    Pain Orientation Left    Pain Descriptors / Indicators Sore    Pain Type Acute pain    Pain Radiating Towards None    Pain Onset 1 to 4 weeks ago    Pain Frequency Intermittent    Aggravating Factors  stretching    Pain Relieving Factors ice    Effect of Pain on Daily Activities mod effect on ADLs    Multiple Pain Sites No                      OT Treatments/Exercises (OP) - 11/07/20 1303       Exercises   Exercises Shoulder      Shoulder Exercises: Supine   Protraction PROM;5 reps    Horizontal ABduction PROM;5 reps    External Rotation PROM;5 reps    Internal Rotation PROM;5 reps    Flexion PROM;5 reps    ABduction PROM;5 reps      Shoulder Exercises: Standing   Protraction Strengthening;10 reps     Protraction Weight (lbs) 1    Horizontal ABduction Strengthening;10 reps    Horizontal ABduction Weight (lbs) 2    External Rotation Strengthening;10 reps    External Rotation Weight (lbs) 2    Internal Rotation Strengthening;10 reps    Internal Rotation Weight (lbs) 2    Flexion Strengthening;10 reps    Shoulder Flexion Weight (lbs) 2    ABduction Strengthening;10 reps    Shoulder ABduction Weight (lbs) 2    Extension Theraband;10 reps    Theraband Level (Shoulder Extension) Level 2 (Red)    Row Theraband;10 reps    Theraband Level (Shoulder Row) Level 2 (Red)    Retraction Theraband;10 reps    Theraband Level (Shoulder Retraction) Level 2 (Red)      Shoulder Exercises: ROM/Strengthening   Proximal Shoulder Strengthening, Seated 10X each, 1# weights, no rest breaks    Other ROM/Strengthening Exercises ball pass behind back for IR, behind head for er, 10X each using tennis ball      Modalities   Modalities Ultrasound      Ultrasound   Ultrasound Location left shoulder    Ultrasound Parameters 1.5 W/cm2    Ultrasound Goals Other (Comment)   decrease adhesions     Manual Therapy   Manual Therapy Soft tissue mobilization    Manual therapy comments manual therapy completed seperately than all other interventions this date    Soft tissue mobilization soft tissue mobilization completed during passive stretching to increase LUE ROM within tolerable pain zone.                      OT Short Term Goals - 11/07/20 1333       OT SHORT TERM GOAL #1   Title Patient will be educated and independent with HEP in order to faciliate progress in therapy and allow him to return to using his LUE for all daily tasks.    Time 4    Period Weeks    Status Achieved    Target Date 09/11/20      OT SHORT TERM GOAL #2   Title Patient will increase his LUE P/ROM to WNL in order to complete dressing tasks with less difficulty.    Time 4  Period Weeks    Status Partially Met       OT SHORT TERM GOAL #3   Title Patient will increase his LUE strength to 3/5 in order to complete wait level self care tasks without difficulty.    Time 4    Period Weeks      OT SHORT TERM GOAL #4   Title Patient will decrease the fascial restrictions in his left UE to mod amount in order to increase the functional mobility needed to complete low level reaching tasks.    Time 4    Period Weeks    Status Achieved      OT SHORT TERM GOAL #5   Title Patient will report a decrease in pain in the LUE during basic ADL tasks of approximately 5/10 or less.    Time 4    Period Weeks               OT Long Term Goals - 11/07/20 1333       OT LONG TERM GOAL #1   Title Patient will increase his LUE A/ROM to WNL while completing all high level reaching tasks as needed without difficulty.    Time 8    Period Weeks    Status On-going      OT LONG TERM GOAL #2   Title Patient will increase his LUE strength to 5/5 in order to return to desired workout routine such as running.    Time 8    Period Weeks    Status Partially Met      OT LONG TERM GOAL #3   Title Patient will report a decrease in pain level of approximately 3/10 or less when utilizing his LUE to complete all daily tasks.    Time 8    Period Weeks    Status Partially Met      OT LONG TERM GOAL #4   Title Patient will decrease his left UE fascial restrictions to min amount or less in order to increase the functional mobility needed to complete reaching tasks.    Time 8    Period Weeks    Status On-going                   Plan - 11/07/20 1334     Clinical Impression Statement A: Pt reports he has returned to his full workout routine at home and has been using low level weights while supine to attempt strengthening. Reassessment completed this session, pt has met 4/5 STGs with remaining goals partially met, and has partially met 2/4 LTGs. Pt has plateaued with P/ROM however A/ROM has improved and strength has  improved significantly since previous measurements. Pt continues to present with signs potentially indicative of adhesive capsulitis. Pt reports continued difficulty with reaching tasks, using hairdryer, and using LUE for lifting. Progressed to strengthening using low leve weights today, pt using 2# as this is similar to his home weights. Also added proximal shoulder strengthening in standing. Verbal cuing for form and technique.    OT Occupational Profile and History Problem Focused Assessment - Including review of records relating to presenting problem    Occupational performance deficits (Please refer to evaluation for details): ADL's;Leisure;Rest and Sleep;IADL's    Body Structure / Function / Physical Skills ADL;UE functional use;Fascial restriction;Pain;ROM;Strength;Edema;Decreased knowledge of precautions;IADL    Rehab Potential Good    Clinical Decision Making Limited treatment options, no task modification necessary    Comorbidities Affecting Occupational Performance: None  Modification or Assistance to Complete Evaluation  No modification of tasks or assist necessary to complete eval    OT Frequency 1x / week    OT Duration 4 weeks    OT Treatment/Interventions Self-care/ADL training;Ultrasound;Patient/family education;DME and/or AE instruction;Passive range of motion;Cryotherapy;Electrical Stimulation;Moist Heat;Neuromuscular education;Therapeutic exercise;Manual Therapy;Therapeutic activities    Plan P: Continue skilled OT services to address A/ROM, strength, and functional use of LUE during daily tasks. Treatment plan: passive stretching, LUE strengthening, A/ROM, functional reaching/overhead reaching tasks, modalities prn. Next session: work on position required for using hairdryer, add scapular theraband to [place] 5/10: shoulder stretches; 6/3: A/ROM    Consulted and Agree with Plan of Care Patient             Patient will benefit from skilled therapeutic  intervention in order to improve the following deficits and impairments:   Body Structure / Function / Physical Skills: ADL, UE functional use, Fascial restriction, Pain, ROM, Strength, Edema, Decreased knowledge of precautions, IADL       Visit Diagnosis: Stiffness of left shoulder, not elsewhere classified  Other symptoms and signs involving the musculoskeletal system  Acute pain of left shoulder    Problem List Patient Active Problem List   Diagnosis Date Noted   Closed fracture of proximal end of left humerus with routine healing 07/27/2020   Special screening for malignant neoplasms, colon 06/10/2018   DDD (degenerative disc disease), cervical 04/06/2015   Tinea pedis of left foot 01/04/2014   Osteoarthritis of foot, left 01/04/2014   Stiffness of joint, lower leg 06/29/2013   Strain of hamstring muscle 06/29/2013   Right hip pain 06/22/2013   Sciatic nerve pain 04/11/2011   Gait abnormality 09/26/2010   STRESS FRACTURE, FOOT 04/24/2009   MONONEURITIS, LEG 03/27/2009    Guadelupe Sabin, OTR/L  [phone] 11/07/2020, 1:59 PM  [facility] [address] [place], [place], [address] Phone: [phone]   Fax:  [phone]  Name: Clifford Harris MRN: [id number] Date of Birth: [birth year]

## 2020-11-09 ENCOUNTER — Encounter (HOSPITAL_COMMUNITY): Payer: Medicare PPO | Admitting: Occupational Therapy

## 2020-11-11 ENCOUNTER — Other Ambulatory Visit: Payer: Self-pay | Admitting: Orthopedic Surgery

## 2020-11-11 DIAGNOSIS — S42295D Other nondisplaced fracture of upper end of left humerus, subsequent encounter for fracture with routine healing: Secondary | ICD-10-CM

## 2020-11-14 ENCOUNTER — Encounter (HOSPITAL_COMMUNITY): Payer: Self-pay | Admitting: Occupational Therapy

## 2020-11-14 ENCOUNTER — Encounter (HOSPITAL_COMMUNITY): Payer: Medicare PPO | Admitting: Occupational Therapy

## 2020-11-14 ENCOUNTER — Ambulatory Visit (HOSPITAL_COMMUNITY): Payer: Medicare PPO | Admitting: Occupational Therapy

## 2020-11-14 ENCOUNTER — Other Ambulatory Visit: Payer: Self-pay

## 2020-11-14 DIAGNOSIS — M25612 Stiffness of left shoulder, not elsewhere classified: Secondary | ICD-10-CM | POA: Diagnosis not present

## 2020-11-14 DIAGNOSIS — R29898 Other symptoms and signs involving the musculoskeletal system: Secondary | ICD-10-CM

## 2020-11-14 DIAGNOSIS — M25512 Pain in left shoulder: Secondary | ICD-10-CM | POA: Diagnosis not present

## 2020-11-14 NOTE — Therapy (Signed)
[place], [place], [address] Phone: [phone]   Fax:  [phone]  Occupational Therapy Treatment  Patient Details  Name: Clifford Harris MRN: [id number] Date of Birth: 1956/04/20 Referring Provider (OT): Arther Abbott, MD   Encounter Date: 11/14/2020   OT End of Session - 11/14/20 1434     Visit Number 21    Number of Visits 24    Date for OT Re-Evaluation 12/07/20    Authorization Type [employer]    Authorization Time Period 8 visits aproved 10/10/20-11/18/20    Authorization - Visit Number 6    Authorization - Number of Visits 8    Progress Note Due on Visit 30    OT Start Time 1301    OT Stop Time 1343    OT Time Calculation (min) 42 min    Activity Tolerance Patient tolerated treatment well    Behavior During Therapy [facility] for tasks assessed/performed             Past Medical History:  Diagnosis Date   Arthritis    Depression    Neuropathy     Past Surgical History:  Procedure Laterality Date   COLONOSCOPY N/A 02/03/2019   Procedure: COLONOSCOPY;  Surgeon: Rogene Houston, MD;  [facility];  Service: Endoscopy;  Laterality: N/A;  730    TONSILLECTOMY AND ADENOIDECTOMY     WISDOM TOOTH EXTRACTION      There were no vitals filed for this visit.   Subjective Assessment - 11/14/20 1300     Subjective  S: I ran out of my medicine and so I take Advil PM.    Currently in Pain? Yes    Pain Score 2     Pain Location Shoulder    Pain Orientation Left    Pain Descriptors / Indicators Sore    Pain Type Acute pain    Pain Radiating Towards None    Pain Onset More than a month ago    Pain Frequency Intermittent    Aggravating Factors  stretching    Pain Relieving Factors ice    Effect of Pain on Daily Activities mod effect on ADLs    Multiple Pain Sites No                          OT Treatments/Exercises (OP) - 11/14/20 1303       Exercises   Exercises  Shoulder      Shoulder Exercises: Supine   Protraction PROM;5 reps    Horizontal ABduction PROM;5 reps    External Rotation PROM;5 reps    Internal Rotation PROM;5 reps    Flexion PROM;5 reps    ABduction PROM;5 reps      Shoulder Exercises: ROM/Strengthening   Other ROM/Strengthening Exercises behind head for er, overhead for abduction, 10X each using tennis ball      Shoulder Exercises: Stretch   External Rotation Stretch 2 reps;30 seconds   shoulder abducted and er with towel pulling down and back   Other Shoulder Stretches supine on foam roller-pt holding 5# dowel rod in flexion and allowing weight to pull into shoulder flexion, 2 reps, 1' each    Other Shoulder Stretches standing with dowel rod in flexion, pt pulling into abduction stretch, 2x30"      Modalities   Modalities Ultrasound      Ultrasound   Ultrasound Location left shoulder    Ultrasound Parameters 1.5 W/cm2  Ultrasound Goals Other (Comment)   decrease adhesions     Manual Therapy   Manual Therapy Soft tissue mobilization;Other (comment)    Manual therapy comments manual therapy completed seperately than all other interventions this date    Soft tissue mobilization soft tissue mobilization completed during passive stretching to increase LUE ROM within tolerable pain zone.    Other Manual Therapy pectoralis release, subscapularis release to decrease muscle spasms and improve joint ROM                      OT Short Term Goals - 11/07/20 1333       OT SHORT TERM GOAL #1   Title Patient will be educated and independent with HEP in order to faciliate progress in therapy and allow him to return to using his LUE for all daily tasks.    Time 4    Period Weeks    Status Achieved    Target Date 09/11/20      OT SHORT TERM GOAL #2   Title Patient will increase his LUE P/ROM to WNL in order to complete dressing tasks with less difficulty.    Time 4    Period Weeks    Status Partially Met      OT  SHORT TERM GOAL #3   Title Patient will increase his LUE strength to 3/5 in order to complete wait level self care tasks without difficulty.    Time 4    Period Weeks      OT SHORT TERM GOAL #4   Title Patient will decrease the fascial restrictions in his left UE to mod amount in order to increase the functional mobility needed to complete low level reaching tasks.    Time 4    Period Weeks    Status Achieved      OT SHORT TERM GOAL #5   Title Patient will report a decrease in pain in the LUE during basic ADL tasks of approximately 5/10 or less.    Time 4    Period Weeks               OT Long Term Goals - 11/07/20 1333       OT LONG TERM GOAL #1   Title Patient will increase his LUE A/ROM to WNL while completing all high level reaching tasks as needed without difficulty.    Time 8    Period Weeks    Status On-going      OT LONG TERM GOAL #2   Title Patient will increase his LUE strength to 5/5 in order to return to desired workout routine such as running.    Time 8    Period Weeks    Status Partially Met      OT LONG TERM GOAL #3   Title Patient will report a decrease in pain level of approximately 3/10 or less when utilizing his LUE to complete all daily tasks.    Time 8    Period Weeks    Status Partially Met      OT LONG TERM GOAL #4   Title Patient will decrease his left UE fascial restrictions to min amount or less in order to increase the functional mobility needed to complete reaching tasks.    Time 8    Period Weeks    Status On-going                   Plan - 11/14/20 1434  Clinical Impression Statement A: Pt reports he is using his LUE during the day, has soreness but no acute pain. Pt inquiring about dry needling, discussed potential benefits with pt and explained the process. Pt is thinking about it and will discuss with MD at next appt. Session focusing on sustained stretching to improve scapular and shoulder mobility, pt completing  weighted stretches and assisted stretches with dowel rods. Manual techniques used to decrease pain and adhesions and increase joint ROM. Attempting to mimic hair dyer position with stretches and exercises as pt continues to be limited in abduction and er. Verbal cuing for form and technique.    Body Structure / Function / Physical Skills ADL;UE functional use;Fascial restriction;Pain;ROM;Strength;Edema;Decreased knowledge of precautions;IADL    Plan P: Check for visit approval. Measure for MD appt, scapular and shoulder strengthening with theraband and add to HEP    OT Home Exercise Plan 5/10: shoulder stretches; 6/3: A/ROM    Consulted and Agree with Plan of Care Patient             Patient will benefit from skilled therapeutic intervention in order to improve the following deficits and impairments:   Body Structure / Function / Physical Skills: ADL, UE functional use, Fascial restriction, Pain, ROM, Strength, Edema, Decreased knowledge of precautions, IADL       Visit Diagnosis: Stiffness of left shoulder, not elsewhere classified  Other symptoms and signs involving the musculoskeletal system  Acute pain of left shoulder    Problem List Patient Active Problem List   Diagnosis Date Noted   Closed fracture of proximal end of left humerus with routine healing 07/27/2020   Special screening for malignant neoplasms, colon 06/10/2018   DDD (degenerative disc disease), cervical 04/06/2015   Tinea pedis of left foot 01/04/2014   Osteoarthritis of foot, left 01/04/2014   Stiffness of joint, lower leg 06/29/2013   Strain of hamstring muscle 06/29/2013   Right hip pain 06/22/2013   Sciatic nerve pain 04/11/2011   Gait abnormality 09/26/2010   STRESS FRACTURE, FOOT 04/24/2009   MONONEURITIS, LEG 03/27/2009    Guadelupe Sabin, OTR/L  [phone] 11/14/2020, 2:39 PM  [facility] [address] [place], [place], [address] Phone:  [phone]   Fax:  [phone]  Name: COLTIN CASHER MRN: [id number] Date of Birth: 02-Jun-1955

## 2020-11-16 ENCOUNTER — Encounter (HOSPITAL_COMMUNITY): Payer: Medicare PPO

## 2020-11-17 ENCOUNTER — Other Ambulatory Visit: Payer: Self-pay

## 2020-11-17 ENCOUNTER — Other Ambulatory Visit (HOSPITAL_BASED_OUTPATIENT_CLINIC_OR_DEPARTMENT_OTHER): Payer: Self-pay

## 2020-11-17 ENCOUNTER — Ambulatory Visit: Payer: Self-pay | Attending: Internal Medicine

## 2020-11-17 DIAGNOSIS — Z23 Encounter for immunization: Secondary | ICD-10-CM

## 2020-11-17 MED ORDER — COVID-19 MRNA VACC (MODERNA) 100 MCG/0.5ML IM SUSP
INTRAMUSCULAR | 0 refills | Status: DC
  Filled 2020-11-17: qty 0.3, 1d supply, fill #0

## 2020-11-17 NOTE — Progress Notes (Signed)
   Covid-19 Vaccination Clinic  Name:  Clifford Harris    MRN: [id number] DOB: Sep 04, 1955  11/17/2020  Mr. Clifford Harris was observed post Covid-19 immunization for 15 minutes without incident. He was provided with Vaccine Information Sheet and instruction to access the V-Safe system.   Mr. Clifford Harris was instructed to call 911 with any severe reactions post vaccine: Difficulty breathing  Swelling of face and throat  A fast heartbeat  A bad rash all over body  Dizziness and weakness   Immunizations Administered     Name Date Dose VIS Date Route   Moderna Covid-19 Booster Vaccine 11/17/2020 10:34 AM 0.25 mL 02/23/2020 Intramuscular   Manufacturer: Moderna   Lot: [id number]   NDC: 56861-683-72

## 2020-11-21 ENCOUNTER — Encounter (HOSPITAL_COMMUNITY): Payer: Self-pay | Admitting: Occupational Therapy

## 2020-11-21 ENCOUNTER — Encounter (HOSPITAL_COMMUNITY): Payer: Medicare PPO | Admitting: Occupational Therapy

## 2020-11-21 ENCOUNTER — Other Ambulatory Visit: Payer: Self-pay

## 2020-11-21 ENCOUNTER — Ambulatory Visit (HOSPITAL_COMMUNITY): Payer: Medicare PPO | Admitting: Occupational Therapy

## 2020-11-21 DIAGNOSIS — M25612 Stiffness of left shoulder, not elsewhere classified: Secondary | ICD-10-CM | POA: Diagnosis not present

## 2020-11-21 DIAGNOSIS — M25512 Pain in left shoulder: Secondary | ICD-10-CM

## 2020-11-21 DIAGNOSIS — R29898 Other symptoms and signs involving the musculoskeletal system: Secondary | ICD-10-CM

## 2020-11-21 NOTE — Therapy (Signed)
[place], [place], [address] Phone: [phone]   Fax:  [phone]  Occupational Therapy Treatment   Patient Details  Name: Clifford Harris MRN: [id number] Date of Birth: [birth year] Referring Provider (OT): Arther Abbott, MD    [facility] OT Assessment - 11/21/20 1300       Assessment   Medical Diagnosis Left Proximal Humerus Fracture      Precautions   Precautions Shoulder    Type of Shoulder Precautions progress as tolerated      Palpation   Palpation comment Mod fascial restrictions noted in left upper arm, upper trapezius, and scapularis region.      AROM   Overall AROM Comments assessed in supine, external and internal rotation with shoulder adducted    AROM Assessment Site Shoulder    Right/Left Shoulder Left    Left Shoulder Flexion 124 Degrees   116 previous   Left Shoulder ABduction 138 Degrees   122 previous   Left Shoulder Internal Rotation 90 Degrees   same as previous   Left Shoulder External Rotation 40 Degrees   50 previous     PROM   Overall PROM Comments Assessed supine. IR/er adducted    PROM Assessment Site Shoulder    Right/Left Shoulder Left    Left Shoulder Flexion 138 Degrees   130 previous   Left Shoulder ABduction 150 Degrees   140 previous   Left Shoulder Internal Rotation 90 Degrees   same as previous   Left Shoulder External Rotation 55 Degrees   52 previou     Strength   Overall Strength Comments assessed seated, er/IR adducted    Strength Assessment Site Shoulder    Right/Left Shoulder Left    Left Shoulder Flexion 5/5   same as previous   Left Shoulder ABduction 5/5   4+/5 previous   Left Shoulder Internal Rotation 5/5   same as previous   Left Shoulder External Rotation 4/5   same as previous              Encounter Date: 11/21/2020   OT End of Session - 11/21/20 1331     Visit Number 22    Number of Visits 24    Date for OT Re-Evaluation 12/07/20     Authorization Type [employer]    Authorization Time Period 4 visits approved 7/17-12/07/20    Authorization - Visit Number 1    Authorization - Number of Visits 4    Progress Note Due on Visit 70    OT Start Time 1258    OT Stop Time 1336    OT Time Calculation (min) 38 min    Activity Tolerance Patient tolerated treatment well    Behavior During Therapy WFL for tasks assessed/performed             Past Medical History:  Diagnosis Date   Arthritis    Depression    Neuropathy     Past Surgical History:  Procedure Laterality Date   COLONOSCOPY N/A 02/03/2019   Procedure: COLONOSCOPY;  Surgeon: Rogene Houston, MD;  [facility];  Service: Endoscopy;  Laterality: N/A;  730    TONSILLECTOMY AND ADENOIDECTOMY     WISDOM TOOTH EXTRACTION      There were no vitals filed for this visit.   Subjective Assessment - 11/21/20 1300     Subjective  S: I don't feel my best.    Currently in Pain? Yes    Pain  Score 8     Pain Location Shoulder    Pain Orientation Left    Pain Descriptors / Indicators Sore    Pain Type Acute pain    Pain Radiating Towards None    Pain Onset More than a month ago    Pain Frequency Intermittent    Aggravating Factors  stretching    Pain Relieving Factors ice    Effect of Pain on Daily Activities mod effect on ADLs    Multiple Pain Sites No                        OT Treatments/Exercises (OP) - 11/21/20 1304       Exercises   Exercises Shoulder      Shoulder Exercises: Supine   Protraction PROM;5 reps    Horizontal ABduction PROM;5 reps    External Rotation PROM;5 reps    Internal Rotation PROM;5 reps    Flexion PROM;5 reps    ABduction PROM;5 reps      Shoulder Exercises: Seated   Protraction Strengthening;10 reps    Protraction Weight (lbs) 2    Horizontal ABduction Strengthening;10 reps    Horizontal ABduction Weight (lbs) 2    External Rotation Strengthening;10 reps    External Rotation Weight (lbs) 2     Internal Rotation Strengthening;10 reps    Internal Rotation Weight (lbs) 2    Flexion Strengthening;10 reps    Flexion Weight (lbs) 2    Abduction Strengthening;10 reps    ABduction Weight (lbs) 2      Shoulder Exercises: ROM/Strengthening   X to V Arms 10X, 2#    Proximal Shoulder Strengthening, Seated 10X each, 2# weights, no rest breaks    Other ROM/Strengthening Exercises behind head for er, overhead for abduction, 10X each using tennis ball      Modalities   Modalities Ultrasound      Ultrasound   Ultrasound Location left shoulder    Ultrasound Parameters 1.5W/cm2    Ultrasound Goals Other (Comment)   decrease adhesions                     OT Short Term Goals - 11/07/20 1333       OT SHORT TERM GOAL #1   Title Patient will be educated and independent with HEP in order to faciliate progress in therapy and allow him to return to using his LUE for all daily tasks.    Time 4    Period Weeks    Status Achieved    Target Date 09/11/20      OT SHORT TERM GOAL #2   Title Patient will increase his LUE P/ROM to WNL in order to complete dressing tasks with less difficulty.    Time 4    Period Weeks    Status Partially Met      OT SHORT TERM GOAL #3   Title Patient will increase his LUE strength to 3/5 in order to complete wait level self care tasks without difficulty.    Time 4    Period Weeks      OT SHORT TERM GOAL #4   Title Patient will decrease the fascial restrictions in his left UE to mod amount in order to increase the functional mobility needed to complete low level reaching tasks.    Time 4    Period Weeks    Status Achieved      OT SHORT TERM GOAL #5   Title Patient will  report a decrease in pain in the LUE during basic ADL tasks of approximately 5/10 or less.    Time 4    Period Weeks               OT Long Term Goals - 11/07/20 1333       OT LONG TERM GOAL #1   Title Patient will increase his LUE A/ROM to WNL while completing all  high level reaching tasks as needed without difficulty.    Time 8    Period Weeks    Status On-going      OT LONG TERM GOAL #2   Title Patient will increase his LUE strength to 5/5 in order to return to desired workout routine such as running.    Time 8    Period Weeks    Status Partially Met      OT LONG TERM GOAL #3   Title Patient will report a decrease in pain level of approximately 3/10 or less when utilizing his LUE to complete all daily tasks.    Time 8    Period Weeks    Status Partially Met      OT LONG TERM GOAL #4   Title Patient will decrease his left UE fascial restrictions to min amount or less in order to increase the functional mobility needed to complete reaching tasks.    Time 8    Period Weeks    Status On-going                   Plan - 11/21/20 1325     Clinical Impression Statement A: Pt reports he has been sore all over since having his booster shot on Friday. Measurements taken for upcoming MD appt, pt has improved ROM both passively and actively by approximately 8-16 degrees. Continued with [place] and passive stretching, strengthening using 2# weights today. Continued with ball pass working on er and overhead abduction. Verbal cuing for form and technique.    Body Structure / Function / Physical Skills ADL;UE functional use;Fascial restriction;Pain;ROM;Strength;Edema;Decreased knowledge of precautions;IADL    Plan P: Follow up on MD appt and dry needling option; continue scapular and shoulder strengthening with theraband and add to [place] 5/10: shoulder stretches; 6/3: A/ROM    Consulted and Agree with Plan of Care Patient             Patient will benefit from skilled therapeutic intervention in order to improve the following deficits and impairments:   Body Structure / Function / Physical Skills: ADL, UE functional use, Fascial restriction, Pain, ROM, Strength, Edema, Decreased knowledge of precautions, IADL       Visit  Diagnosis: Stiffness of left shoulder, not elsewhere classified  Acute pain of left shoulder  Other symptoms and signs involving the musculoskeletal system    Problem List Patient Active Problem List   Diagnosis Date Noted   Closed fracture of proximal end of left humerus with routine healing 07/27/2020   Special screening for malignant neoplasms, colon 06/10/2018   DDD (degenerative disc disease), cervical 04/06/2015   Tinea pedis of left foot 01/04/2014   Osteoarthritis of foot, left 01/04/2014   Stiffness of joint, lower leg 06/29/2013   Strain of hamstring muscle 06/29/2013   Right hip pain 06/22/2013   Sciatic nerve pain 04/11/2011   Gait abnormality 09/26/2010   STRESS FRACTURE, FOOT 04/24/2009   MONONEURITIS, LEG 03/27/2009    Guadelupe Sabin, OTR/L  [phone] 11/21/2020, 1:38  PM  [place], [place], [address] Phone: [phone]   Fax:  [phone]  Name: YOUSOF ALDERMAN MRN: [id number] Date of Birth: 27-Jul-1955

## 2020-11-23 ENCOUNTER — Encounter (HOSPITAL_COMMUNITY): Payer: Medicare PPO

## 2020-11-23 ENCOUNTER — Telehealth (HOSPITAL_COMMUNITY): Payer: Self-pay | Admitting: Occupational Therapy

## 2020-11-23 DIAGNOSIS — Z1331 Encounter for screening for depression: Secondary | ICD-10-CM | POA: Diagnosis not present

## 2020-11-23 DIAGNOSIS — J329 Chronic sinusitis, unspecified: Secondary | ICD-10-CM | POA: Diagnosis not present

## 2020-11-23 DIAGNOSIS — E6609 Other obesity due to excess calories: Secondary | ICD-10-CM | POA: Diagnosis not present

## 2020-11-23 DIAGNOSIS — J209 Acute bronchitis, unspecified: Secondary | ICD-10-CM | POA: Diagnosis not present

## 2020-11-23 DIAGNOSIS — U071 COVID-19: Secondary | ICD-10-CM | POA: Diagnosis not present

## 2020-11-23 DIAGNOSIS — Z6834 Body mass index (BMI) 34.0-34.9, adult: Secondary | ICD-10-CM | POA: Diagnosis not present

## 2020-11-23 NOTE — Telephone Encounter (Signed)
Patient posted positive with Covid today-pt will call us back to r/s if needed

## 2020-11-27 ENCOUNTER — Encounter: Payer: Medicare PPO | Admitting: Orthopedic Surgery

## 2020-11-28 ENCOUNTER — Ambulatory Visit (HOSPITAL_COMMUNITY): Payer: Medicare PPO | Admitting: Occupational Therapy

## 2020-11-28 ENCOUNTER — Encounter (HOSPITAL_COMMUNITY): Payer: Medicare PPO | Admitting: Occupational Therapy

## 2020-11-30 ENCOUNTER — Encounter (HOSPITAL_COMMUNITY): Payer: Medicare PPO

## 2020-12-11 ENCOUNTER — Ambulatory Visit (INDEPENDENT_AMBULATORY_CARE_PROVIDER_SITE_OTHER): Payer: Medicare PPO | Admitting: Orthopedic Surgery

## 2020-12-11 ENCOUNTER — Telehealth: Payer: Self-pay | Admitting: Orthopedic Surgery

## 2020-12-11 ENCOUNTER — Encounter: Payer: Self-pay | Admitting: Orthopedic Surgery

## 2020-12-11 ENCOUNTER — Other Ambulatory Visit: Payer: Self-pay

## 2020-12-11 ENCOUNTER — Ambulatory Visit: Payer: Medicare PPO

## 2020-12-11 VITALS — BP 128/83 | HR 58 | Ht 70.0 in | Wt 169.0 lb

## 2020-12-11 DIAGNOSIS — S42295D Other nondisplaced fracture of upper end of left humerus, subsequent encounter for fracture with routine healing: Secondary | ICD-10-CM

## 2020-12-11 NOTE — Telephone Encounter (Signed)
Patient and also spouse, designated contact, asked for Toniann Fail at time of today's visit with Dr Romeo Apple, 12/11/20. States Toniann Fail assisted with this request in past, and did 'in house'.  Asking for records, including office, physical therapy, and any related after 08/31/20. York Spaniel can stop by when records may be available?

## 2020-12-11 NOTE — Telephone Encounter (Signed)
Called back to patient to relay (reached voice mail, left message to return call). °

## 2020-12-11 NOTE — Patient Instructions (Signed)
While we are working on your approval for MRI please go ahead and call to schedule your appointment with [facility] within at least one (1) week.   Central Scheduling [phone]  

## 2020-12-11 NOTE — Progress Notes (Signed)
Chief Complaint  Patient presents with   Shoulder Injury    07/16/20 left shoulder fracture still has pain    This is a 65 year old male fractured his shoulder and March had a nondisplaced fracture involving the proximal humerus and greater tuberosity with minimal displacement  He still having pain requiring medication.  He has a lot of crepitance in the shoulder which is not painful but bothersome and annoyance.  His therapy has recommended possible dry needling.  He did get an injection last time with some relief but the pain came back  Examination reveals crepitance on range of motion after 90 degrees of flexion and abduction  He has good rotator cuff strength no instability  Seems to have more pain with abduction  I repeated his x-rays including internal and external rotation views and axillary view.  He does have some heterotopic bone above his greater tuberosity which may or may not be causing impingement  I do recommend MRI to evaluate his rotator cuff and the position of this bone fragment to see if surgery is needed to either take this bone fragment out or debrided  He is amenable to this I will talk to therapy about dry needling and I will see him after his MRI I will call him if dry needling is an option

## 2020-12-12 NOTE — Telephone Encounter (Signed)
Done

## 2020-12-26 ENCOUNTER — Other Ambulatory Visit: Payer: Self-pay | Admitting: Orthopedic Surgery

## 2020-12-26 DIAGNOSIS — S42295D Other nondisplaced fracture of upper end of left humerus, subsequent encounter for fracture with routine healing: Secondary | ICD-10-CM

## 2020-12-27 ENCOUNTER — Ambulatory Visit (HOSPITAL_COMMUNITY)
Admission: RE | Admit: 2020-12-27 | Discharge: 2020-12-27 | Disposition: A | Discharge status: Home / Self Care | Payer: Medicare PPO | Source: Ambulatory Visit | Attending: Orthopedic Surgery | Admitting: Orthopedic Surgery

## 2020-12-27 ENCOUNTER — Other Ambulatory Visit: Payer: Self-pay

## 2020-12-27 DIAGNOSIS — S42212A Unspecified displaced fracture of surgical neck of left humerus, initial encounter for closed fracture: Secondary | ICD-10-CM | POA: Diagnosis not present

## 2020-12-27 DIAGNOSIS — S42295D Other nondisplaced fracture of upper end of left humerus, subsequent encounter for fracture with routine healing: Secondary | ICD-10-CM | POA: Diagnosis not present

## 2021-01-12 ENCOUNTER — Other Ambulatory Visit: Payer: Self-pay | Admitting: Orthopedic Surgery

## 2021-01-12 DIAGNOSIS — S42295D Other nondisplaced fracture of upper end of left humerus, subsequent encounter for fracture with routine healing: Secondary | ICD-10-CM

## 2021-01-17 ENCOUNTER — Other Ambulatory Visit: Payer: Self-pay

## 2021-01-17 ENCOUNTER — Ambulatory Visit (INDEPENDENT_AMBULATORY_CARE_PROVIDER_SITE_OTHER): Payer: Medicare PPO | Admitting: Orthopedic Surgery

## 2021-01-17 DIAGNOSIS — S46012D Strain of muscle(s) and tendon(s) of the rotator cuff of left shoulder, subsequent encounter: Secondary | ICD-10-CM | POA: Diagnosis not present

## 2021-01-17 DIAGNOSIS — S42295D Other nondisplaced fracture of upper end of left humerus, subsequent encounter for fracture with routine healing: Secondary | ICD-10-CM

## 2021-01-17 DIAGNOSIS — Z8781 Personal history of (healed) traumatic fracture: Secondary | ICD-10-CM | POA: Diagnosis not present

## 2021-01-17 NOTE — Progress Notes (Signed)
Chief Complaint  Patient presents with   Results    MRI    Encounter Diagnoses  Name Primary?   Other closed nondisplaced fracture of proximal end of left humerus with routine healing, subsequent encounter Yes   Traumatic incomplete tear of left rotator cuff, subsequent encounter     Clifford Harris is here for follow-up regarding his left shoulder status post proximal humerus fracture July 16, 2020  He healed the fracture did therapy still has pain in his left arm and weakness in his left shoulder  Reexamination reveals tenderness over the [facility] joint weakness in abduction and flexion although his range of motion has returned to normal in abduction and flexion and external rotation  I reviewed his MRI with him he has an [facility] joint arthritic condition and he has a partial almost full-thickness rotator cuff tear supraspinatus tendon  We discussed possible treatment options and agreed to repeat his injection in the subacromial space and continue therapy including dry needling he will see [place] again in a month  A steroid injection was performed at subacromial space left shoulder using 1% plain Lidocaine and 6 mg of Celestone. This was well tolerated.

## 2021-02-15 ENCOUNTER — Encounter: Payer: Self-pay | Admitting: Orthopedic Surgery

## 2021-02-15 ENCOUNTER — Other Ambulatory Visit: Payer: Self-pay

## 2021-02-15 ENCOUNTER — Ambulatory Visit: Payer: Medicare PPO | Admitting: Orthopedic Surgery

## 2021-02-15 VITALS — BP 151/80 | HR 59 | Ht 70.0 in | Wt 169.0 lb

## 2021-02-15 DIAGNOSIS — S46012D Strain of muscle(s) and tendon(s) of the rotator cuff of left shoulder, subsequent encounter: Secondary | ICD-10-CM | POA: Diagnosis not present

## 2021-02-15 DIAGNOSIS — S42295D Other nondisplaced fracture of upper end of left humerus, subsequent encounter for fracture with routine healing: Secondary | ICD-10-CM

## 2021-02-15 NOTE — Progress Notes (Signed)
FOLLOW UP   Encounter Diagnoses  Name Primary?   Other closed nondisplaced fracture of proximal end of left humerus with routine healing, subsequent encounter Yes   Traumatic incomplete tear of left rotator cuff, subsequent encounter      Chief Complaint  Patient presents with   Shoulder Pain    Left shoulder fracture 07/16/20     Clifford Harris is progressing well with his left shoulder he still has some residual deficits from the fracture.  In review we did do an MRI of his shoulder and he had some AC joint arthritis and a mild rotator cuff tear was small.  He says he can do everything he did before except he cannot do push-ups and he does have some discomfort when he does his exercises such as running bike riding and strengthening exercises for his upper extremity  The exam was as follows  He has abduction strength grade 5 but with pain he has flexion strength in the scapular plane which is 5 out of 5 with pain  Is active flexion is 150 degrees is active abduction is 100degrees which is diminished from the opposite side  He is here with his wife today I recommended that we not proceed with surgery because I do not think he can make a much better I think he can get better with just exercise.  I think the risks of surgery in this setting for the gain that we would get his on warranted  They agree I will see them in March 2022 we will probably x-ray at that time and just reevaluate everything

## 2021-02-16 ENCOUNTER — Telehealth: Payer: Self-pay | Admitting: Orthopedic Surgery

## 2021-02-16 NOTE — Telephone Encounter (Signed)
Called, left message for patient / wife, designated contact, regarding authorization form - noted that the request form, although shows expiration date of 12/12/21, was signed for service dates through 12/11/20 only; therefore, a new authorization form is needed.(Reference: Release #[id number] placed on hold

## 2021-02-19 ENCOUNTER — Encounter (HOSPITAL_COMMUNITY): Payer: Self-pay | Admitting: Physical Therapy

## 2021-02-19 ENCOUNTER — Other Ambulatory Visit: Payer: Self-pay

## 2021-02-19 ENCOUNTER — Ambulatory Visit (HOSPITAL_COMMUNITY): Payer: Medicare PPO | Attending: Orthopedic Surgery | Admitting: Physical Therapy

## 2021-02-19 DIAGNOSIS — M25612 Stiffness of left shoulder, not elsewhere classified: Secondary | ICD-10-CM | POA: Diagnosis not present

## 2021-02-19 DIAGNOSIS — M25512 Pain in left shoulder: Secondary | ICD-10-CM | POA: Insufficient documentation

## 2021-02-19 NOTE — Therapy (Signed)
[facility] [address] [place], [place], [address] Phone: [phone]   Fax:  [phone]  Physical Therapy Evaluation  Patient Details  Name: Clifford Harris MRN: [id number] Date of Birth: 03-05-1956 Referring Provider (PT): Fuller Canada MD   Encounter Date: 02/19/2021   PT End of Session - 02/19/21 1101     Visit Number 1    Number of Visits 5    Date for PT Re-Evaluation 03/19/21    Authorization Type [employer]    Authorization Time Period Check auth    PT Start Time 1033    PT Stop Time 1115    PT Time Calculation (min) 42 min    Activity Tolerance Patient tolerated treatment well    Behavior During Therapy [facility] for tasks assessed/performed             Past Medical History:  Diagnosis Date   Arthritis    Depression    Neuropathy     Past Surgical History:  Procedure Laterality Date   COLONOSCOPY N/A 02/03/2019   Procedure: COLONOSCOPY;  Surgeon: Malissa Hippo, MD;  [facility];  Service: Endoscopy;  Laterality: N/A;  730    TONSILLECTOMY AND ADENOIDECTOMY     WISDOM TOOTH EXTRACTION      There were no vitals filed for this visit.    Subjective Assessment - 02/19/21 1040     Subjective Patient presents to therapy with complaint of LT shoulder pain. He is previously known to this clinic and has had OT previously for LT shoulder pain. Patient reports in March he fell and broke his shoulder. He eventually developed frozen shoulder. His flexibility has improved but he is still limited in LT arm function and overhead mobility. He had several months of PT, he would like to try dry needling.    Pertinent History Lt prox humerus fx and frozen shoulder 2022    Limitations Lifting;House hold activities    Patient Stated Goals get rid of pain    Currently in Pain? No/denies    Pain Score --   0 pain at rest, 5-6 with activity and after exercise   Pain Location Shoulder    Pain Orientation Left     Pain Descriptors / Indicators Aching;Nagging;Tightness    Pain Type Acute pain    Pain Onset More than a month ago    Pain Frequency Intermittent    Aggravating Factors  movement, OH reach, exercise, after exercise    Pain Relieving Factors rest, meds    Effect of Pain on Daily Activities Limits                [facility] PT Assessment - 02/19/21 0001       Assessment   Medical Diagnosis Lt shoulder pain    Referring Provider (PT) Fuller Canada MD    Onset Date/Surgical Date 07/16/20    Prior Therapy Yes      Precautions   Precautions None      Restrictions   Weight Bearing Restrictions No      Balance Screen   Has the patient fallen in the past 6 months No      Prior Function   Level of Independence Independent      Cognition   Overall Cognitive Status Within Functional Limits for tasks assessed      AROM   AROM Assessment Site Shoulder    Right/Left Shoulder Right;Left    Right Shoulder Flexion 140 Degrees  Right Shoulder ABduction 143 Degrees    Left Shoulder Flexion 126 Degrees    Left Shoulder ABduction 112 Degrees    Left Shoulder Internal Rotation --   T10   Left Shoulder External Rotation --   C7     PROM   Right/Left Shoulder Right;Left      Strength   Strength Assessment Site Shoulder    Right/Left Shoulder Right;Left    Right Shoulder Flexion 5/5    Right Shoulder ABduction 5/5    Right Shoulder Internal Rotation 5/5    Right Shoulder External Rotation 5/5    Left Shoulder Flexion 4+/5    Left Shoulder ABduction 5/5    Left Shoulder Internal Rotation 4+/5    Left Shoulder External Rotation 4+/5      Palpation   Palpation comment Min TTP about LT upper trap, infraspinatus and distal teres minor                        Objective measurements completed on examination: See above findings.       [facility] Adult PT Treatment/Exercise - 02/19/21 0001       Shoulder Exercises: Standing   Other Standing Exercises shoulder circle  stabilization x 15 Each                     PT Education - 02/19/21 1043     Education Details on evaluation findings, POC and HEP. Also dry needling intervention, precautions and contraindications    Person(s) Educated Patient    Methods Explanation;Handout    Comprehension Verbalized understanding              PT Short Term Goals - 02/19/21 1119       PT SHORT TERM GOAL #1   Title Patient will be independent with initial HEP and self-management strategies to improve functional outcomes    Time 2    Period Weeks    Status New    Target Date 03/05/21               PT Long Term Goals - 02/19/21 1140       PT LONG TERM GOAL #1   Title Patient will report at least 75% overall improvement in subjective complaint to indicate improvement in ability to perform ADLs.    Time 4    Period Weeks    Status New    Target Date 03/19/21      PT LONG TERM GOAL #2   Title Patient will have LT shoulder flexion AROM within 5 degrees of contralateral side for improved ability to perform OH ADLs.    Time 4    Period Weeks    Status New    Target Date 03/19/21      PT LONG TERM GOAL #3   Title Patient will report LT shoulder pain <3/10 with activity/ reaching OH for improved ability to perform ADLs and daily exercise.    Time 4    Period Weeks    Status New    Target Date 03/19/21                    Plan - 02/19/21 1117     Clinical Impression Statement Patient is a 66 y.o. male who presents to physical therapy with complaint of Lt shoulder pain. Patient demonstrates decreased strength, ROM restriction, reduced flexibility, and increased tenderness to palpation which are likely contributing to symptoms of pain and are negatively  impacting patient ability to perform ADLs. Patient will benefit from skilled physical therapy services to address these deficits to reduce pain and improve level of function with ADLs    Examination-Activity Limitations Caring for  Others;Reach Overhead;Lift    Examination-Participation Restrictions Community Activity;Driving;Shop;Cleaning;Meal Prep;Yard Work    Stability/Clinical Decision Making Stable/Uncomplicated    Optometrist Low    Rehab Potential Good    PT Frequency 1x / week    PT Duration 4 weeks    PT Treatment/Interventions ADLs/Self Care Home Management;Biofeedback;Cryotherapy;Ultrasound;Traction;Therapeutic activities;Parrafin;Fluidtherapy;Patient/family education;Manual techniques;Manual lymph drainage;Energy conservation;Splinting;Spinal Manipulations;Dry needling;Visual/perceptual remediation/compensation;Passive range of motion;Scar mobilization;Orthotic Fit/Training;Compression bandaging;Therapeutic exercise;DME Instruction;Contrast Bath;Electrical Stimulation;Iontophoresis 4mg /ml Dexamethasone;Moist Heat;Neuromuscular re-education;Vasopneumatic Device;Taping;Joint Manipulations;Vestibular    PT Next Visit Plan DN to LT UT and infraspinatus. Shoulder stabilization as tolerated, also thoracic mobility    PT Home Exercise Plan Eval: ball on wall stabilization    Consulted and Agree with Plan of Care Patient             Patient will benefit from skilled therapeutic intervention in order to improve the following deficits and impairments:  Pain, Improper body mechanics, Increased fascial restricitons, Postural dysfunction, Decreased mobility, Decreased activity tolerance, Impaired UE functional use, Decreased strength, Decreased range of motion  Visit Diagnosis: Left shoulder pain, unspecified chronicity  Stiffness of left shoulder, not elsewhere classified     Problem List Patient Active Problem List   Diagnosis Date Noted   Closed fracture of proximal end of left humerus with routine healing 07/27/2020   Special screening for malignant neoplasms, colon 06/10/2018   DDD (degenerative disc disease), cervical 04/06/2015   Tinea pedis of left foot 01/04/2014   Osteoarthritis of foot,  left 01/04/2014   Stiffness of joint, lower leg 06/29/2013   Strain of hamstring muscle 06/29/2013   Right hip pain 06/22/2013   Sciatic nerve pain 04/11/2011   Gait abnormality 09/26/2010   STRESS FRACTURE, FOOT 04/24/2009   MONONEURITIS, LEG 03/27/2009   5:54 PM, 02/19/21 02/21/21 PT DPT  Physical Therapist with [facility]  [phone]  [facility] [address] [place], [place], [place] Phone: [phone]   Fax:  [phone]  Name: ELIGH RYBACKI MRN: [name] Date of Birth: 1955-07-05

## 2021-02-19 NOTE — Patient Instructions (Signed)

## 2021-02-22 ENCOUNTER — Encounter (HOSPITAL_COMMUNITY): Payer: Self-pay | Admitting: Physical Therapy

## 2021-02-22 ENCOUNTER — Other Ambulatory Visit: Payer: Self-pay

## 2021-02-22 ENCOUNTER — Telehealth: Payer: Self-pay

## 2021-02-22 ENCOUNTER — Ambulatory Visit (HOSPITAL_COMMUNITY): Payer: Medicare PPO | Admitting: Physical Therapy

## 2021-02-22 DIAGNOSIS — M25512 Pain in left shoulder: Secondary | ICD-10-CM

## 2021-02-22 DIAGNOSIS — M25612 Stiffness of left shoulder, not elsewhere classified: Secondary | ICD-10-CM | POA: Diagnosis not present

## 2021-02-22 NOTE — Telephone Encounter (Signed)
Patient came to office; new release form signed.

## 2021-02-22 NOTE — Telephone Encounter (Signed)
Noted; patient signed as discussed.

## 2021-02-22 NOTE — Telephone Encounter (Signed)
Patient came by the office today stating that he needed to sign a new release so that additional records could be released. Per last note from Coy Saunas. Patient had only signed release for certain visits and last two ov couldn't be released. Had patient sign another release of information. Asked him to push the date out so that he wouldn't have to come back and sign another release.

## 2021-02-22 NOTE — Therapy (Signed)
[facility] [address] [place], [place], [address] Phone: [phone]   Fax:  [phone]  Physical Therapy Treatment  Patient Details  Name: Clifford Harris MRN: [id number] Date of Birth: [birth year] Referring Provider (PT): Fuller Canada MD   Encounter Date: 02/22/2021   PT End of Session - 02/22/21 1430     Visit Number 2    Number of Visits 5    Date for PT Re-Evaluation 03/19/21    Authorization Type [employer]    Authorization Time Period 5 approved 10/17-11/14/22    Authorization - Visit Number 1    Authorization - Number of Visits 5    PT Start Time 1435    PT Stop Time 1520    PT Time Calculation (min) 45 min    Activity Tolerance Patient tolerated treatment well    Behavior During Therapy [facility] for tasks assessed/performed             Past Medical History:  Diagnosis Date   Arthritis    Depression    Neuropathy     Past Surgical History:  Procedure Laterality Date   COLONOSCOPY N/A 02/03/2019   Procedure: COLONOSCOPY;  Surgeon: Malissa Hippo, MD;  Location: [facility];  Service: Endoscopy;  Laterality: N/A;  730    TONSILLECTOMY AND ADENOIDECTOMY     WISDOM TOOTH EXTRACTION      There were no vitals filed for this visit.   Subjective Assessment - 02/22/21 1438     Subjective Been doing HEP, "it's alright". Some stiffness and soreness in shoulder today "mulling" pain. Not bothering him at this very moment.    Pertinent History Lt prox humerus fx and frozen shoulder 2022    Limitations Lifting;House hold activities    Patient Stated Goals get rid of pain    Currently in Pain? No/denies    Pain Onset More than a month ago                               [facility] Adult PT Treatment/Exercise - 02/22/21 0001       Shoulder Exercises: Standing   Other Standing Exercises shoulder ER with GTB 10 x 5", shoulder horizontal abduction x10, band row GTB x 10      Manual Therapy    Manual Therapy Soft tissue mobilization;Joint mobilization    Manual therapy comments manual therapy completed seperately than all other interventions this date    Joint Mobilization Thoracic P/A Grade II    Soft tissue mobilization STM to LT upper trap, infraspinatus pre and post needling, with trigger point identification and surface area prep              Trigger Point Dry Needling - 02/22/21 0001     Consent Given? Yes    Education Handout Provided Previously provided    Muscles Treated Head and Neck Upper trapezius    Muscles Treated Upper Quadrant Infraspinatus    Dry Needling Comments 1 needle to LT upper trap, 1 needle to infraspinatus, tolerated well    Upper Trapezius Response Twitch reponse elicited;Palpable increased muscle length                     PT Short Term Goals - 02/19/21 1119       PT SHORT TERM GOAL #1   Title Patient will be independent with initial HEP and self-management strategies to improve functional outcomes  Time 2    Period Weeks    Status New    Target Date 03/05/21               PT Long Term Goals - 02/19/21 1140       PT LONG TERM GOAL #1   Title Patient will report at least 75% overall improvement in subjective complaint to indicate improvement in ability to perform ADLs.    Time 4    Period Weeks    Status New    Target Date 03/19/21      PT LONG TERM GOAL #2   Title Patient will have LT shoulder flexion AROM within 5 degrees of contralateral side for improved ability to perform OH ADLs.    Time 4    Period Weeks    Status New    Target Date 03/19/21      PT LONG TERM GOAL #3   Title Patient will report LT shoulder pain <3/10 with activity/ reaching OH for improved ability to perform ADLs and daily exercise.    Time 4    Period Weeks    Status New    Target Date 03/19/21                   Plan - 02/22/21 1701     Clinical Impression Statement Reviewed HEP and dry needling precautions. Patient  consenting to trial of dry needling this date. Performed manual assessment with palpable trigger point identification. Patient tolerated dry needling well. Also performed thoracic PA mobs, with noted hypomobility throughout mid thoracic spine. Patient demos 4 degree improvement in LT shoulder flexion following manual/ DN. Introduced scapular stabilization progressions with ther band. Patient educated on purpose and function of each. Added to HEP and issued handout. Patient will continue to benefit from skilled therapy services to progress scapular stabilization and shoulder mobility for decreased pain and improved function with overhead ADLs.    Examination-Activity Limitations Caring for Others;Reach Overhead;Lift    Examination-Participation Restrictions Community Activity;Driving;Shop;Cleaning;Meal Prep;Yard Work    Stability/Clinical Decision Making Stable/Uncomplicated    Rehab Potential Good    PT Frequency 1x / week    PT Duration 4 weeks    PT Treatment/Interventions ADLs/Self Care Home Management;Biofeedback;Cryotherapy;Ultrasound;Traction;Therapeutic activities;Parrafin;Fluidtherapy;Patient/family education;Manual techniques;Manual lymph drainage;Energy conservation;Splinting;Spinal Manipulations;Dry needling;Visual/perceptual remediation/compensation;Passive range of motion;Scar mobilization;Orthotic Fit/Training;Compression bandaging;Therapeutic exercise;DME Instruction;Contrast Bath;Electrical Stimulation;Iontophoresis 4mg /ml Dexamethasone;Moist Heat;Neuromuscular re-education;Vasopneumatic Device;Taping;Joint Manipulations;Vestibular    PT Next Visit Plan Assess response to DN. Continue as indicated. Shoulder stabilization as tolerated, also thoracic mobility. Band progressions. Wall clock, extensions, thoracic extension mob    PT Home Exercise Plan Eval: ball on wall stabilization 10/20 band rows, band ER, horiz band abduction    Consulted and Agree with Plan of Care Patient              Patient will benefit from skilled therapeutic intervention in order to improve the following deficits and impairments:  Pain, Improper body mechanics, Increased fascial restricitons, Postural dysfunction, Decreased mobility, Decreased activity tolerance, Impaired UE functional use, Decreased strength, Decreased range of motion  Visit Diagnosis: Left shoulder pain, unspecified chronicity  Stiffness of left shoulder, not elsewhere classified     Problem List Patient Active Problem List   Diagnosis Date Noted   Closed fracture of proximal end of left humerus with routine healing 07/27/2020   Special screening for malignant neoplasms, colon 06/10/2018   DDD (degenerative disc disease), cervical 04/06/2015   Tinea pedis of left foot 01/04/2014   Osteoarthritis of foot, left 01/04/2014  Stiffness of joint, lower leg 06/29/2013   Strain of hamstring muscle 06/29/2013   Right hip pain 06/22/2013   Sciatic nerve pain 04/11/2011   Gait abnormality 09/26/2010   STRESS FRACTURE, FOOT 04/24/2009   MONONEURITIS, LEG 03/27/2009   5:21 PM, 02/22/21 Georges Lynch PT DPT  Physical Therapist with [facility]  [phone]   [facility] [address] [place], [place], [address] Phone: [phone]   Fax:  [phone]  Name: FLYNN GWYN MRN: [id number] Date of Birth: Jul 30, 1955

## 2021-02-22 NOTE — Patient Instructions (Signed)
Access Code: [email or web address] URL: [email or web address] Date: 02/22/2021 Prepared by: Georges Lynch  Exercises Standing Shoulder Horizontal Abduction with Resistance - 2 x daily - 7 x weekly - 2 sets - 10 reps Shoulder External Rotation and Scapular Retraction with Resistance - 2 x daily - 7 x weekly - 2 sets - 10 reps - 5 second hold Standing Shoulder Row with Anchored Resistance - 2 x daily - 7 x weekly - 2 sets - 10 reps - 5 second hold

## 2021-02-22 NOTE — Telephone Encounter (Signed)
No further response. 

## 2021-02-23 NOTE — Telephone Encounter (Signed)
Done ready for pick-up 

## 2021-02-28 ENCOUNTER — Ambulatory Visit (HOSPITAL_COMMUNITY): Payer: Medicare PPO | Admitting: Physical Therapy

## 2021-03-05 DIAGNOSIS — Z23 Encounter for immunization: Secondary | ICD-10-CM | POA: Diagnosis not present

## 2021-04-03 DIAGNOSIS — M5431 Sciatica, right side: Secondary | ICD-10-CM | POA: Diagnosis not present

## 2021-04-03 DIAGNOSIS — J309 Allergic rhinitis, unspecified: Secondary | ICD-10-CM | POA: Diagnosis not present

## 2021-04-03 DIAGNOSIS — Z6823 Body mass index (BMI) 23.0-23.9, adult: Secondary | ICD-10-CM | POA: Diagnosis not present

## 2021-04-03 DIAGNOSIS — M1991 Primary osteoarthritis, unspecified site: Secondary | ICD-10-CM | POA: Diagnosis not present

## 2021-04-03 DIAGNOSIS — J111 Influenza due to unidentified influenza virus with other respiratory manifestations: Secondary | ICD-10-CM | POA: Diagnosis not present

## 2021-05-16 ENCOUNTER — Other Ambulatory Visit: Payer: Self-pay | Admitting: Orthopedic Surgery

## 2021-05-16 DIAGNOSIS — S42295D Other nondisplaced fracture of upper end of left humerus, subsequent encounter for fracture with routine healing: Secondary | ICD-10-CM

## 2021-05-17 DIAGNOSIS — H04123 Dry eye syndrome of bilateral lacrimal glands: Secondary | ICD-10-CM | POA: Diagnosis not present

## 2021-07-03 DIAGNOSIS — M1991 Primary osteoarthritis, unspecified site: Secondary | ICD-10-CM | POA: Diagnosis not present

## 2021-07-03 DIAGNOSIS — M5431 Sciatica, right side: Secondary | ICD-10-CM | POA: Diagnosis not present

## 2021-07-03 DIAGNOSIS — Z0001 Encounter for general adult medical examination with abnormal findings: Secondary | ICD-10-CM | POA: Diagnosis not present

## 2021-07-03 DIAGNOSIS — R7309 Other abnormal glucose: Secondary | ICD-10-CM | POA: Diagnosis not present

## 2021-07-03 DIAGNOSIS — Z6826 Body mass index (BMI) 26.0-26.9, adult: Secondary | ICD-10-CM | POA: Diagnosis not present

## 2021-07-03 DIAGNOSIS — Z1331 Encounter for screening for depression: Secondary | ICD-10-CM | POA: Diagnosis not present

## 2021-07-03 DIAGNOSIS — J309 Allergic rhinitis, unspecified: Secondary | ICD-10-CM | POA: Diagnosis not present

## 2021-07-03 DIAGNOSIS — F419 Anxiety disorder, unspecified: Secondary | ICD-10-CM | POA: Diagnosis not present

## 2021-07-03 DIAGNOSIS — E663 Overweight: Secondary | ICD-10-CM | POA: Diagnosis not present

## 2021-07-03 DIAGNOSIS — F320 Major depressive disorder, single episode, mild: Secondary | ICD-10-CM | POA: Diagnosis not present

## 2021-07-10 IMAGING — DX DG HUMERUS 2V *L*
4 series · 4 of 4 positions shown · non-contrast
Comparison: Left shoulder radiographs July 16, 2020

CLINICAL DATA: Pain following fall

EXAM:
LEFT HUMERUS - 2+ VIEW

[humerus ap (1 of 2)]
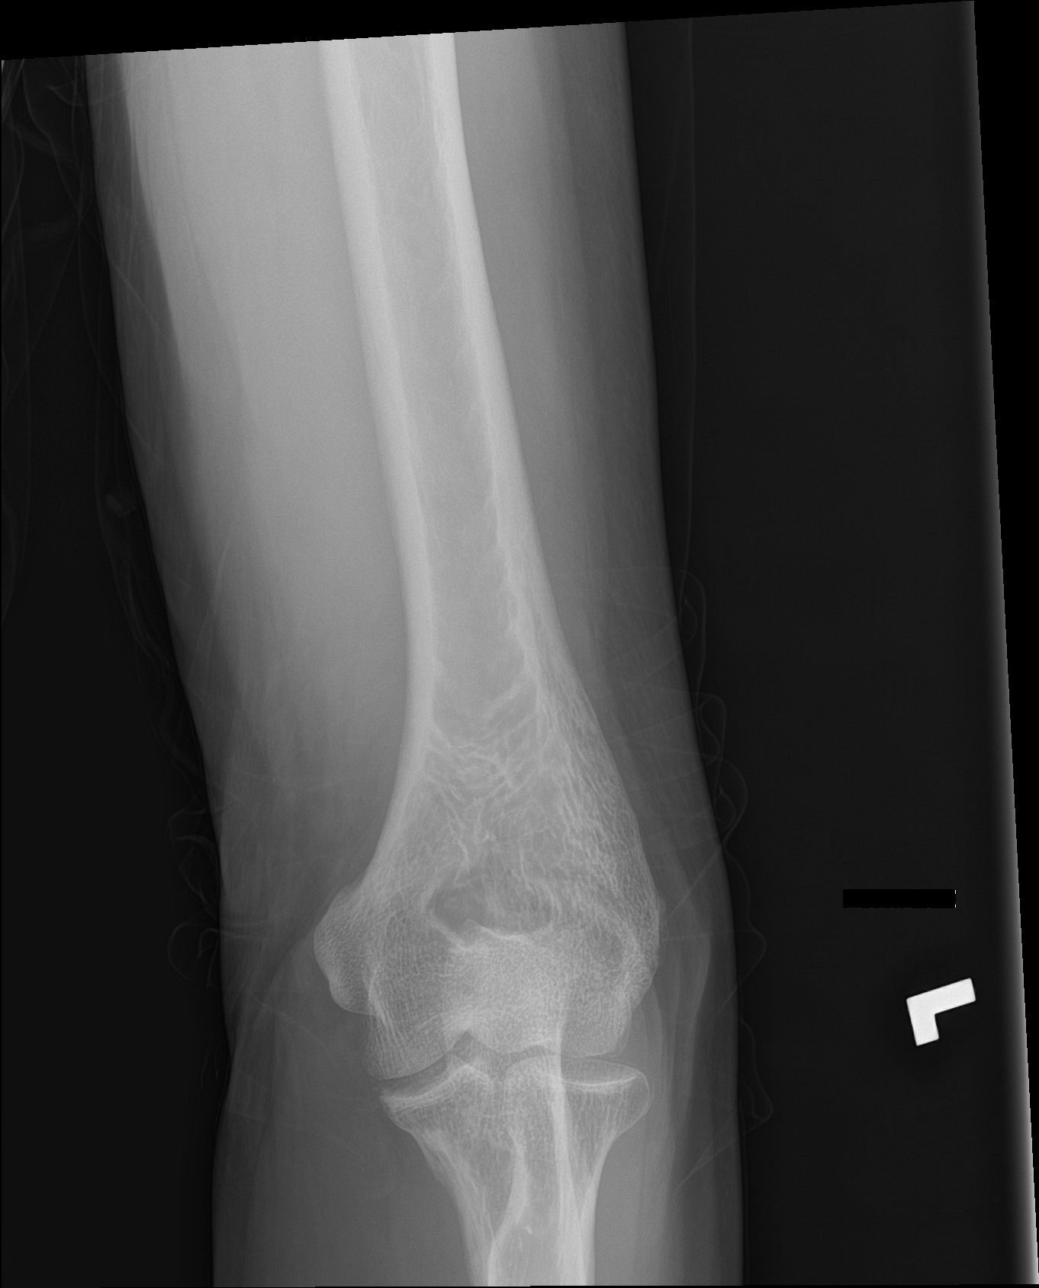

[humerus lat (1 of 2)]
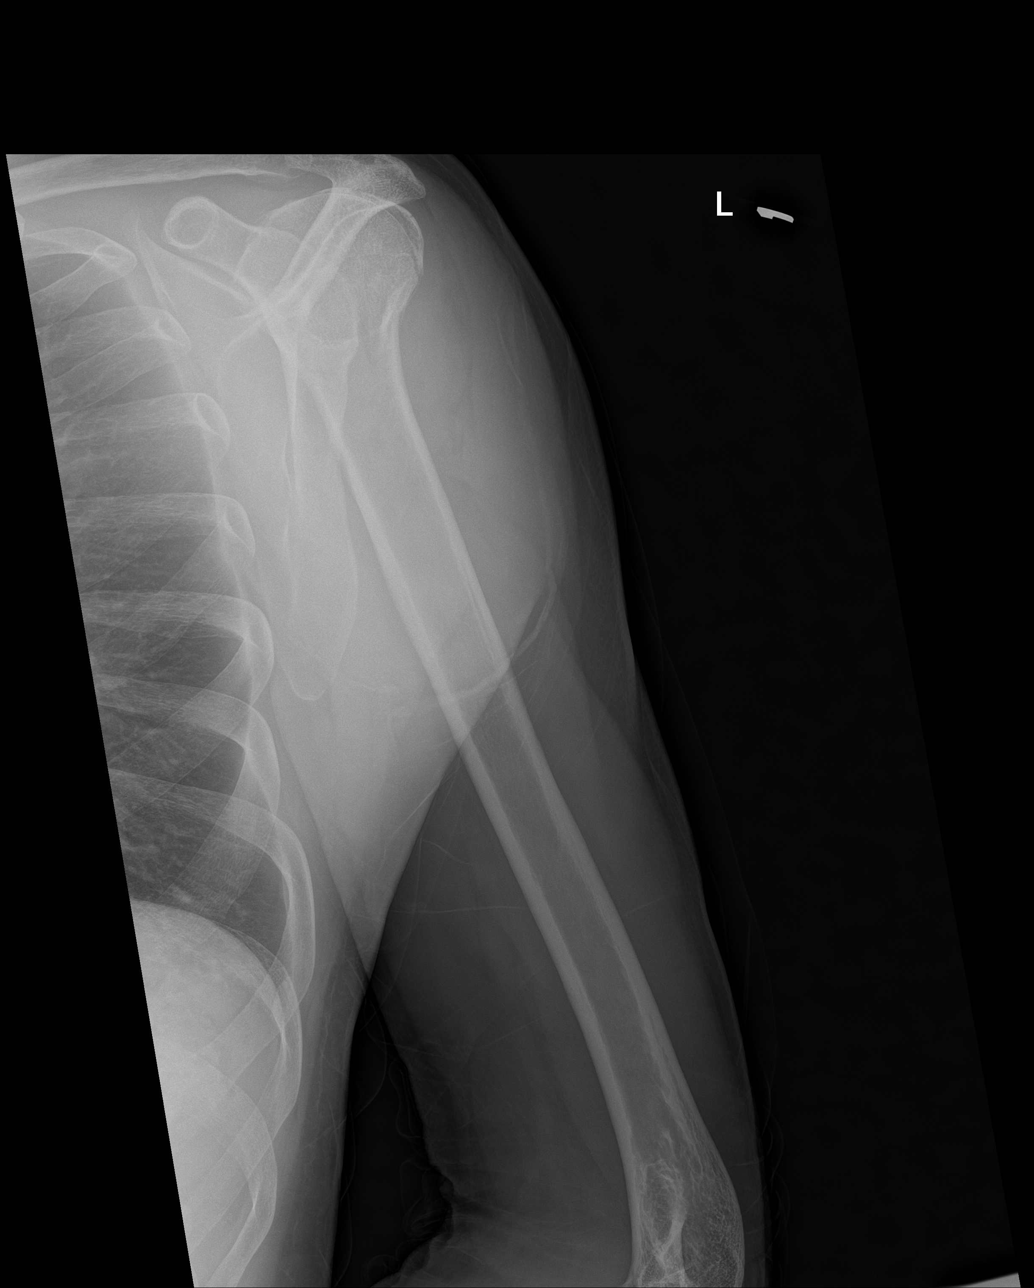

[humerus ap (2 of 2)]
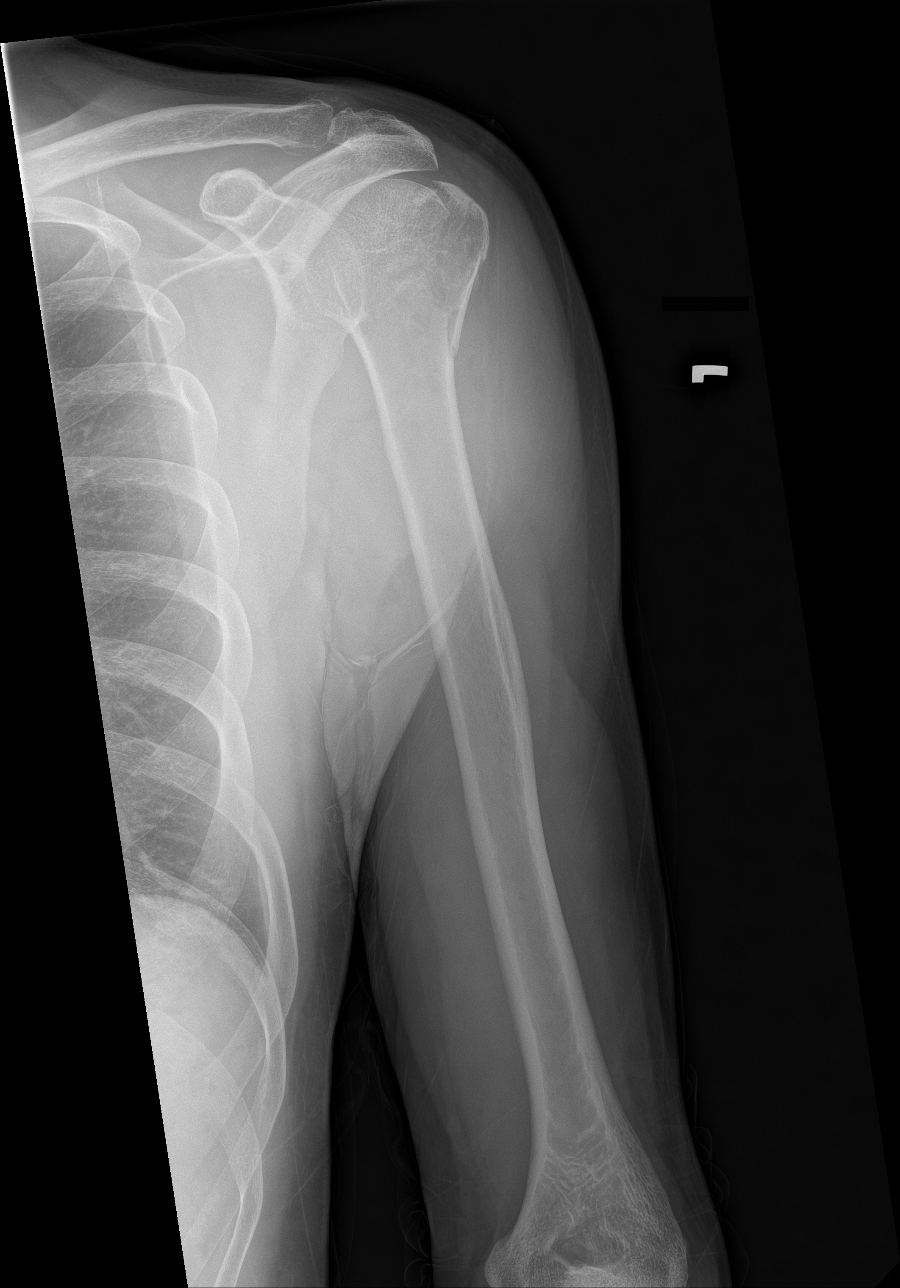

[humerus lat (2 of 2)]
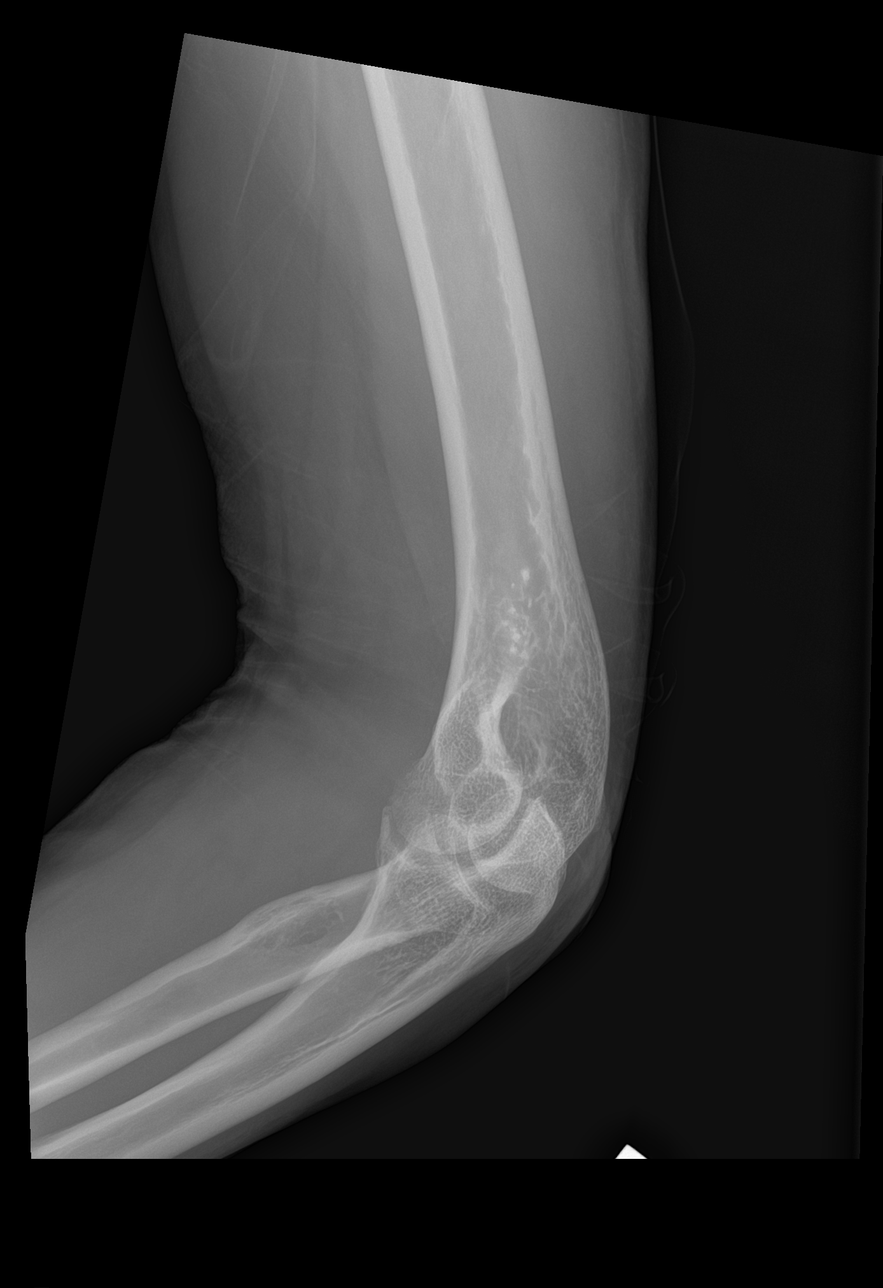

[4 of 4 positions shown; findings below may reference images not displayed]

FINDINGS: Frontal and lateral views were obtained. Comminuted fracture of the
proximal left humerus along the lateral humeral head and lateral
proximal metaphysis noted, described in the shoulder report.
Separation of fracture fragments noted in this area. No fracture or
dislocation seen more distally. Osteoarthritic change noted in the
shoulder joint. Visualized left lung clear.
IMPRESSION: Comminuted fracture along the lateral aspect of the proximal humerus
with mild displacement of fracture fragments. No fracture more
distally. No dislocation. Osteoarthritic change in the left shoulder
noted.

## 2021-07-16 ENCOUNTER — Other Ambulatory Visit: Payer: Self-pay

## 2021-07-16 ENCOUNTER — Ambulatory Visit: Payer: Medicare PPO | Admitting: Orthopedic Surgery

## 2021-07-16 ENCOUNTER — Ambulatory Visit: Payer: Medicare PPO

## 2021-07-16 DIAGNOSIS — S42202S Unspecified fracture of upper end of left humerus, sequela: Secondary | ICD-10-CM

## 2021-07-16 DIAGNOSIS — S42295D Other nondisplaced fracture of upper end of left humerus, subsequent encounter for fracture with routine healing: Secondary | ICD-10-CM

## 2021-07-16 DIAGNOSIS — G8929 Other chronic pain: Secondary | ICD-10-CM

## 2021-07-16 DIAGNOSIS — M25512 Pain in left shoulder: Secondary | ICD-10-CM

## 2021-07-16 MED ORDER — CELECOXIB 200 MG PO CAPS: 200.0000 mg | ORAL_CAPSULE | Freq: Two times a day (BID) | ORAL | 2 refills | Status: DC

## 2021-07-16 NOTE — Progress Notes (Signed)
Chief Complaint  ?Patient presents with  ? Follow-up  ?  Left shoulder DOI 07/16/20  ? ? ?Encounter Diagnoses  ?Name Primary?  ? Other closed nondisplaced fracture of proximal end of left humerus with routine healing, subsequent encounter Yes  ? Chronic left shoulder pain   ? Closed fracture of proximal end of left humerus, unspecified fracture morphology, sequela   ? ? ?Clifford Harris had a fracture of his left proximal humerus last year to the day he was treated conservatively with physical therapy injection anti-inflammatories currently on Celebrex takes 1 a day ? ?He is here for 1 year follow-up ? ?His main complaints are aching pain left shoulder and arm although he has maintained good function ? ?He does complain that he wakes up 2 to 3 days a week with aching in his left shoulder ? ?We did get an MRI of his shoulder at that time he had [facility] joint arthritis mild rotator cuff tear which was small undersurface articular side ? ?He has regained almost all of his range of motion with slight deficits in flexion mainly terminal flexion 10 degrees 15 degrees ? ?He has no weakness in the arm at this time ? ?We talked about possible surgery we took a new x-ray ? ?The fracture has remodeled very well. ? ?There is no gross deformity.  He has a flat acromion.  He may have some small spurring at the tuberosity ? ?We discussed that surgery probably will not make him better I recommend he take the Celebrex twice a day and actually refilled it I also advised that he get some Capzasin ? ?I am just not sure that surgery can make him better and can obviously make him worse if he got a complication ? ?He is currently in agreement with that and will see me on an as-needed basis ? ?Meds ordered this encounter  ?Medications  ? celecoxib (CELEBREX) 200 MG capsule  ?  Sig: Take 1 capsule (200 mg total) by mouth 2 (two) times daily.  ?  Dispense:  60 capsule  ?  Refill:  2  ? ? ?

## 2021-10-09 DIAGNOSIS — R0982 Postnasal drip: Secondary | ICD-10-CM | POA: Diagnosis not present

## 2021-10-09 DIAGNOSIS — J342 Deviated nasal septum: Secondary | ICD-10-CM | POA: Diagnosis not present

## 2021-10-09 DIAGNOSIS — J310 Chronic rhinitis: Secondary | ICD-10-CM | POA: Diagnosis not present

## 2021-10-09 DIAGNOSIS — J343 Hypertrophy of nasal turbinates: Secondary | ICD-10-CM | POA: Diagnosis not present

## 2021-11-16 ENCOUNTER — Other Ambulatory Visit: Payer: Self-pay | Admitting: Orthopedic Surgery

## 2021-11-16 DIAGNOSIS — S42295D Other nondisplaced fracture of upper end of left humerus, subsequent encounter for fracture with routine healing: Secondary | ICD-10-CM

## 2021-12-12 ENCOUNTER — Encounter: Payer: Self-pay | Admitting: Orthopedic Surgery

## 2021-12-12 ENCOUNTER — Ambulatory Visit: Payer: Medicare PPO | Admitting: Orthopedic Surgery

## 2021-12-12 DIAGNOSIS — G8929 Other chronic pain: Secondary | ICD-10-CM | POA: Diagnosis not present

## 2021-12-12 DIAGNOSIS — J342 Deviated nasal septum: Secondary | ICD-10-CM | POA: Diagnosis not present

## 2021-12-12 DIAGNOSIS — Z01818 Encounter for other preprocedural examination: Secondary | ICD-10-CM | POA: Diagnosis not present

## 2021-12-12 DIAGNOSIS — J343 Hypertrophy of nasal turbinates: Secondary | ICD-10-CM | POA: Diagnosis not present

## 2021-12-12 DIAGNOSIS — S42295D Other nondisplaced fracture of upper end of left humerus, subsequent encounter for fracture with routine healing: Secondary | ICD-10-CM | POA: Diagnosis not present

## 2021-12-12 DIAGNOSIS — M25512 Pain in left shoulder: Secondary | ICD-10-CM

## 2021-12-12 DIAGNOSIS — R0982 Postnasal drip: Secondary | ICD-10-CM | POA: Diagnosis not present

## 2021-12-12 DIAGNOSIS — J310 Chronic rhinitis: Secondary | ICD-10-CM | POA: Diagnosis not present

## 2021-12-12 DIAGNOSIS — S46012D Strain of muscle(s) and tendon(s) of the rotator cuff of left shoulder, subsequent encounter: Secondary | ICD-10-CM

## 2021-12-12 NOTE — Addendum Note (Signed)
Addended byCaffie Damme on: 12/12/2021 12:04 PM   Modules accepted: Orders

## 2021-12-12 NOTE — Progress Notes (Signed)
Chief Complaint  Patient presents with   Shoulder Pain    Left /discuss surgery    Encounter Diagnoses  Name Primary?   Other closed nondisplaced fracture of proximal end of left humerus with routine healing, subsequent encounter Yes   Chronic left shoulder pain    Traumatic incomplete tear of left rotator cuff, subsequent encounter    Clifford Harris comes in today still complaining of chronic left shoulder pain is having pain along his lateral deltoid  On exam he has decreased external rotation with his arm at his side decreased abduction and decreased flexion.  His flexion is approximate 120 degrees versus 180 on the right he also has about 30 degrees of external rotation with the arm at the side and his abduction is less than 90 degrees at about 80 degrees  He has tenderness over the lateral deltoid  His MRI shows a articular sided tear and a bursal sided tear with some AC joint arthrosis  We discussed treatment options and he wishes to proceed with surgery so we will schedule him for arthroscopy left shoulder rotator cuff repair and distal clavicle excision  Patient understands risk and benefits of surgery and that we may not find anything to repair or we may do a full takedown of his partial tear and rerepair of his rotator cuff which would require 6 months of recovery time.

## 2021-12-12 NOTE — Patient Instructions (Addendum)
Surgery for Rotator Cuff Tear  Rotator cuff surgery is only recommended for individuals who have experienced persistent disability for greater than 3 months of non-surgical (conservative) treatment. Surgery is not necessary but is recommended for individuals who experience difficulty completing daily activities or athletes who are unable to compete. Rotator cuff tears do not usually heal without surgical intervention. If left alone small rotator cuff tears usually become larger. Younger athletes who have a rotator cuff tear may be recommended for surgery without attempting conservative rehabilitation. The purpose of surgery is to regain function of the shoulder joint and eliminate pain associated with the injury. In addition to repairing the tendon tear, the surgery may often remove a portion of the bony roof of the shoulder (acromion) as well as the chronically thickened and inflamed membrane below the acromion (subacromial bursa). REASONS NOT TO OPERATE  Infection of the shoulder. Inability to complete a rehabilitation program. Patients who have other conditions (emotional or psychological) conditions that contribute to their shoulder condition. RISKS AND COMPLICATIONS Infection. Re-tear of the rotator cuff tendons or muscles. Shoulder stiffness and/or weakness. Inability to compete in athletics. Acromioclavicular (AC) joint pain Risks of surgery: infection, bleeding, nerve damage, or damage to surrounding tissues. TECHNIQUE There are different surgical procedures used to treat rotator cuff tears. The type of procedure depends on the extent of injury as well as the surgeon's preference. All of the surgical techniques for rotator cuff tears have the same goal of repairing the torn tendon, removing part of the acromion, and removing the subacromial bursa. There are two main types of procedures: arthroscopic and open incision. Arthroscopic procedures are usually completed and you go home the same day  as surgery (outpatient). These procedures use multiple small incisions in which tools and a video camera are placed to work on the shoulder. An electric shaver removes the bursa, then a power burr shaves down the portion of the acromion that places pressure on the rotator cuff. Finally the rotator cuff is sewed (sutured) back to the humeral head. Open incision procedures require a larger incision. The deltoid muscle is detached from the acromion and a ligament in the shoulder (coracoacromial) is cut in order for the surgeon to access the rotator cuff. The subacromial bursa is removed as well as part of the acromion to give the rotator cuff room to move freely. The torn tendon is then sutured to the humeral head. After the rotator cuff is repaired, then the deltoid is reattached and the incision is closed up.  RECOVERY  Post-operative care depends on the surgical technique and the preferences of your therapist. Keep the wound clean and dry for the first 10 to 14 days after surgery. Keep your shoulder and arm in the sling provided to you for as long as you have been instructed to. You will be given pain medications by your caregiver. Passive (without using muscles) shoulder movements may be begun when instructed. It is important to follow through with you rehabilitation program in order to have the best possible recovery. RETURN TO SPORTS  The rehabilitation period will depend on the sport and position you play as well as the success of the operation. The minimum recovery period is 6 months. You must have regained complete shoulder motion and strength before returning to sports. SEEK IMMEDIATE MEDICAL CARE IF:  Any medications produce adverse side effects. Any complications from surgery occur: Pain, numbness, or coldness in the extremity operated upon. Discoloration of the nail beds (they become blue or gray) of   the extremity operated upon. Signs of infections (fever, pain, inflammation, redness, or  persistent bleeding).   You have decided to proceed with rotator cuff repair surgery. You have decided not to continue with nonoperative measures such as but not limited to oral medication,   activity modification, physical therapy, bracing, or injection.  We will perform rotator cuff repair. Some of the risks associated with rotator cuff repair include but are not limited to Bleeding Infection Swelling Stiffness Blood clot Pain Re-tearing of the rotator cuff Failure of the rotator cuff to heal   If you're not comfortable with these risks and would like to continue with nonoperative treatment please let Dr. Romeo Apple know prior to your surgery. Your surgery will be at [facility] by Dr Romeo Apple  The hospital will contact you with a preoperative appointment to discuss Anesthesia.  Please arrive on time or 15 minutes early for the preoperative appointment, they have a very tight schedule if you are late or do not come in your surgery will be cancelled.  The phone number is [phone]. Please bring your medications with you for the appointment. They will tell you the arrival time and medication instructions when you have your preoperative evaluation. Do not wear nail polish the day of your surgery and if you take Phentermine you need to stop this medication ONE WEEK prior to your surgery. Please arrive at the hospital 2 hours before procedure if scheduled at 9:30 or later in the day or at the time the nurse tells you at your preoperative visit.   If you have my chart do not use the time given in my chart use the time given to you by the nurse during your preoperative visit.   Your surgery  time may change. Please be available for phone calls the day of your surgery and the day before. The Short Stay department may need to discuss changes about your surgery time. Not reaching the you could lead to procedure delays and possible cancellation.  You must have a ride home and someone to stay with  you for 24 to 48 hours. The person taking you home will receive and sign for the your discharge instructions.  Please be prepared to give your support person's name and telephone number to [employer]. Dr Romeo Apple will need that name and phone number post procedure.

## 2022-01-14 ENCOUNTER — Telehealth: Payer: Self-pay | Admitting: *Deleted

## 2022-01-14 ENCOUNTER — Ambulatory Visit: Payer: Medicare PPO | Admitting: Podiatry

## 2022-01-14 ENCOUNTER — Encounter: Payer: Self-pay | Admitting: Podiatry

## 2022-01-14 DIAGNOSIS — L84 Corns and callosities: Secondary | ICD-10-CM | POA: Diagnosis not present

## 2022-01-14 DIAGNOSIS — B351 Tinea unguium: Secondary | ICD-10-CM | POA: Diagnosis not present

## 2022-01-14 DIAGNOSIS — M19072 Primary osteoarthritis, left ankle and foot: Secondary | ICD-10-CM

## 2022-01-14 DIAGNOSIS — B353 Tinea pedis: Secondary | ICD-10-CM

## 2022-01-14 MED ORDER — TERBINAFINE HCL 250 MG PO TABS: 250.0000 mg | ORAL_TABLET | Freq: Every day | ORAL | 0 refills | Status: AC

## 2022-01-14 MED ORDER — CLOTRIMAZOLE-BETAMETHASONE 1-0.05 % EX CREA: 1.0000 | TOPICAL_CREAM | Freq: Two times a day (BID) | CUTANEOUS | 0 refills | Status: AC

## 2022-01-14 NOTE — Telephone Encounter (Signed)
Spoke with the pharmacist and they said that it has been on his profile since 03/2020.

## 2022-01-14 NOTE — Telephone Encounter (Signed)
Per last visit today 01/14/22 it says NKDA in his chart.

## 2022-01-14 NOTE — Progress Notes (Signed)
  Subjective:  Patient ID: Clifford Harris, male    DOB: 05/31/1955,  MRN: [id number]  Chief Complaint  Patient presents with   Nail Problem      np right great toenail partial removed/ would like both feet checked, pt is a runner for 52 yrs    66 y.o. male presents with the above complaint. History confirmed with patient.  Right great toenail is partially come off he is left with a large callus at the tip of the toe that gets painful when he runs.  He saw a sports medicine physician for years ago for a large lump on the left foot that he was told was arthritis.  Not particularly painful.  The nails are yellow discolored thickened and elongated and have an abnormal appearance.  Itchy dry red skin present on both feet worse on the left  Objective:  Physical Exam: warm, good capillary refill, no trophic changes or ulcerative lesions, normal DP and PT pulses, normal sensory exam, onychomycosis, tinea pedis, and callus tip of right hallux, large bony prominence around the first TMT on the left foot  Assessment:   1. Tinea pedis of both feet   2. Onychomycosis   3. Callus of foot   4. Arthritis of midtarsal joint of left foot      Plan:  Patient was evaluated and treated and all questions answered.  Discussed etiology options of onychomycosis.  I recommended Lamisil treatment.  He has no notable contraindications to this.  Rx for 90-day course was sent to his pharmacy.  Discussed the etiology and treatment options for tinea pedis.  Discussed topical and oral treatment.  Recommended topical treatment with Lotrisone cream.  This was sent to the patient's pharmacy.  Also discussed appropriate foot hygiene, use of antifungal spray such as Tinactin in shoes, as well as cleaning  foot surfaces such as showers and bathroom floors with bleach.  Regarding the callus on the right foot I recommended urea cream with salicylic acid he will get over-the-counter.  The nail itself did not seem ingrown or was  painful.  Did not recommend avulsion for this   The arthritis in the left foot is not particularly painful.  We discussed symptomatic treatment of this including injection therapy.  He has orthotics for this.  No indication for surgical correction at this point   Return if symptoms worsen or fail to improve.

## 2022-01-14 NOTE — Patient Instructions (Signed)
Look for urea 40% with 2% salicyclic acid cream or ointment and apply to the thickened dry skin / calluses. This can be bought over the counter, at a pharmacy or online such as [employer].

## 2022-01-14 NOTE — Telephone Encounter (Signed)
Pharmacy is calling to ask that another medication be sent , patient is allergic to the Lamisil.Please advise.

## 2022-02-12 DIAGNOSIS — R0982 Postnasal drip: Secondary | ICD-10-CM | POA: Diagnosis not present

## 2022-02-12 DIAGNOSIS — J343 Hypertrophy of nasal turbinates: Secondary | ICD-10-CM | POA: Diagnosis not present

## 2022-02-12 DIAGNOSIS — J310 Chronic rhinitis: Secondary | ICD-10-CM | POA: Diagnosis not present

## 2022-02-12 DIAGNOSIS — J342 Deviated nasal septum: Secondary | ICD-10-CM | POA: Diagnosis not present

## 2022-02-13 ENCOUNTER — Other Ambulatory Visit: Payer: Self-pay | Admitting: Orthopedic Surgery

## 2022-02-13 DIAGNOSIS — E663 Overweight: Secondary | ICD-10-CM | POA: Diagnosis not present

## 2022-02-13 DIAGNOSIS — Z6826 Body mass index (BMI) 26.0-26.9, adult: Secondary | ICD-10-CM | POA: Diagnosis not present

## 2022-02-13 DIAGNOSIS — J309 Allergic rhinitis, unspecified: Secondary | ICD-10-CM | POA: Diagnosis not present

## 2022-02-13 DIAGNOSIS — S42295D Other nondisplaced fracture of upper end of left humerus, subsequent encounter for fracture with routine healing: Secondary | ICD-10-CM

## 2022-03-26 DIAGNOSIS — R0982 Postnasal drip: Secondary | ICD-10-CM | POA: Diagnosis not present

## 2022-03-26 DIAGNOSIS — J342 Deviated nasal septum: Secondary | ICD-10-CM | POA: Diagnosis not present

## 2022-03-26 DIAGNOSIS — J310 Chronic rhinitis: Secondary | ICD-10-CM | POA: Diagnosis not present

## 2022-03-26 DIAGNOSIS — J343 Hypertrophy of nasal turbinates: Secondary | ICD-10-CM | POA: Diagnosis not present

## 2022-04-04 NOTE — Patient Instructions (Signed)
Clifford Harris  04/04/2022     @PREFPERIOPPHARMACY @   Your procedure is scheduled on  04/09/2022.   Report to [facility] at  0600 A.M.   Call this number if you have problems the morning of surgery:  [phone]  If you experience any cold or flu symptoms such as cough, fever, chills, shortness of breath, etc. between now and your scheduled surgery, please notify [phone] at the above number.   Remember:  Do not eat or drink after midnight.      Take these medicines the morning of surgery with A SIP OF WATER                 lexapro, gabapentin, tramadol.    Do not wear jewelry, make-up or nail polish.  Do not wear lotions, powders, or perfumes, or deodorant.  Do not shave 48 hours prior to surgery.  Men may shave face and neck.  Do not bring valuables to the hospital.  [facility] is not responsible for any belongings or valuables.  Contacts, dentures or bridgework may not be worn into surgery.  Leave your suitcase in the car.  After surgery it may be brought to your room.  For patients admitted to the hospital, discharge time will be determined by your treatment team.  Patients discharged the day of surgery will not be allowed to drive home and must have someone with them for 24 hours.    Special instructions:   DO NOT smoke tobacco or vape for 24 hours before your procedure.  Please read over the following fact sheets that you were given. Pain Booklet, Coughing and Deep Breathing, Surgical Site Infection Prevention, Anesthesia Post-op Instructions, and Care and Recovery After Surgery      Surgery for Rotator Cuff Tear, Care After This sheet gives you information about how to care for yourself after your procedure. Your health care provider may also give you more specific instructions. If you have problems or questions, contact your health care provider. What can I expect after the procedure? After the procedure, it is common to have: Redness. Swelling. A  small amount of fluid or blood in the incision area. Pain. Stiffness. Follow these instructions at home: If you have a sling or a shoulder immobilizer: Wear it as told by your health care provider. Remove it only as told by your health care provider. Loosen it if your fingers tingle, become numb, or turn cold and blue. Keep it clean and dry. Bathing Do not take baths, swim, or use a hot tub until your health care provider approves. Ask your health care provider if you may take showers. You may only be allowed to take sponge baths. Keep your bandage (dressing) dry until your health care provider says it can be removed. If your sling or shoulder immobilizer is not waterproof: Do not let it get wet. Remove it when you take a bath or shower as told by your health care provider. Once the sling or shoulder immobilizer is removed, try not to move your shoulder until your health care provider says that you can. Incision care  Follow instructions from your health care provider about how to take care of your incision. Make sure you: Wash your hands with soap and water for at least 20 seconds before and after you change your dressing. If soap and water are not available, use hand sanitizer. Change your dressing as told by your health care provider. Leave stitches (  sutures), skin glue, or adhesive strips in place. These skin closures may need to stay in place for 2 weeks or longer. If adhesive strip edges start to loosen and curl up, you may trim the loose edges. Do not remove adhesive strips completely unless your health care provider tells you to do that. Check your incision area every day for signs of infection. Check for: More redness, swelling, or pain. More fluid or blood. Warmth. Pus or a bad smell. Managing pain, stiffness, and swelling  If directed, put ice on your shoulder area. To do this: Put ice in a plastic bag. Place a towel between your skin and the bag. Leave the ice on for 20  minutes, 2-3 times a day. Remove the ice if your skin turns bright red. This is very important. If you cannot feel pain, heat, or cold, you have a greater risk of damage to the area. Move your fingers often to reduce stiffness and swelling. Raise (elevate) your upper body on pillows when you lie down and when you sleep. Do not sleep on the front of your body (abdomen). Do not sleep on the side that your surgery was performed on. Medicines Take over-the-counter and prescription medicines only as told by your health care provider. Ask your health care provider if the medicine prescribed to you: Requires you to avoid driving or using machinery. Can cause constipation. You may need to take these actions to prevent or treat constipation: Drink enough fluid to keep your urine pale yellow. Take over-the-counter or prescription medicines. Eat foods that are high in fiber, such as beans, whole grains, and fresh fruits and vegetables. Limit foods that are high in fat and processed sugars, such as fried or sweet foods. Driving If you were given a sedative during the procedure, it can affect you for several hours. Do not drive or operate machinery until your health care provider says that it is safe. Do not drive while wearing a sling or a shoulder immobilizer. Ask your health care provider when it is safe to drive. Activity Do not use your arm to support your body weight until your health care provider says that you can. Do not lift or hold anything with your arm until your health care provider approves. Return to your normal activities as told by your health care provider. Ask your health care provider what activities are safe for you. Do exercises as told by your health care provider. General instructions Do not use any products that contain nicotine or tobacco, such as cigarettes, e-cigarettes, and chewing tobacco. These can delay healing after surgery. If you need help quitting, ask your health care  provider. Keep all follow-up visits. This is important. Contact a health care provider if: You have a fever or chills. You have more redness, swelling, or pain around your incision. You have more fluid or blood coming from your incision. Your incision feels warm to the touch. You have pus or a bad smell coming from your incision. You have pain that gets worse or does not get better with medicine. Get help right away if: You have severe pain. You lose feeling in your arm or hand. Your hand or fingers turn very pale or blue. Summary If you have a sling or shoulder immobilizer, wear it as told by your health care provider. Remove it only as told by your health care provider. Change your dressing as told by your health care provider. Check the incision area every day for signs of infection. If  directed, put ice on your shoulder area 2-3 times a day. Do not use your arm to lift anything or to support your body weight until your health care provider says that you can. This information is not intended to replace advice given to you by your health care provider. Make sure you discuss any questions you have with your health care provider. Document Revised: 08/25/2019 Document Reviewed: 08/25/2019 Elsevier Patient Education  2023 [employer] General Anesthesia, Adult, Care After The following information offers guidance on how to care for yourself after your procedure. Your health care provider may also give you more specific instructions. If you have problems or questions, contact your health care provider. What can I expect after the procedure? After the procedure, it is common for people to: Have pain or discomfort at the IV site. Have nausea or vomiting. Have a sore throat or hoarseness. Have trouble concentrating. Feel cold or chills. Feel weak, sleepy, or tired (fatigue). Have soreness and body aches. These can affect parts of the body that were not involved in surgery. Follow these  instructions at home: For the time period you were told by your health care provider:  Rest. Do not participate in activities where you could fall or become injured. Do not drive or use machinery. Do not drink alcohol. Do not take sleeping pills or medicines that cause drowsiness. Do not make important decisions or sign legal documents. Do not take care of children on your own. General instructions Drink enough fluid to keep your urine pale yellow. If you have sleep apnea, surgery and certain medicines can increase your risk for breathing problems. Follow instructions from your health care provider about wearing your sleep device: Anytime you are sleeping, including during daytime naps. While taking prescription pain medicines, sleeping medicines, or medicines that make you drowsy. Return to your normal activities as told by your health care provider. Ask your health care provider what activities are safe for you. Take over-the-counter and prescription medicines only as told by your health care provider. Do not use any products that contain nicotine or tobacco. These products include cigarettes, chewing tobacco, and vaping devices, such as e-cigarettes. These can delay incision healing after surgery. If you need help quitting, ask your health care provider. Contact a health care provider if: You have nausea or vomiting that does not get better with medicine. You vomit every time you eat or drink. You have pain that does not get better with medicine. You cannot urinate or have bloody urine. You develop a skin rash. You have a fever. Get help right away if: You have trouble breathing. You have chest pain. You vomit blood. These symptoms may be an emergency. Get help right away. Call 911. Do not wait to see if the symptoms will go away. Do not drive yourself to the hospital. Summary After the procedure, it is common to have a sore throat, hoarseness, nausea, vomiting, or to feel weak,  sleepy, or fatigue. For the time period you were told by your health care provider, do not drive or use machinery. Get help right away if you have difficulty breathing, have chest pain, or vomit blood. These symptoms may be an emergency. This information is not intended to replace advice given to you by your health care provider. Make sure you discuss any questions you have with your health care provider. Document Revised: 07/20/2021 Document Reviewed: 07/20/2021 Elsevier Patient Education  2023 Else[employer] Inc. How to Use Chlorhexidine Before Surgery Chlorhexidine gluconate (CHG) is a  germ-killing (antiseptic) solution that is used to clean the skin. It can get rid of the bacteria that normally live on the skin and can keep them away for about 24 hours. To clean your skin with CHG, you may be given: A CHG solution to use in the shower or as part of a sponge bath. A prepackaged cloth that contains CHG. Cleaning your skin with CHG may help lower the risk for infection: While you are staying in the intensive care unit of the hospital. If you have a vascular access, such as a central line, to provide short-term or long-term access to your veins. If you have a catheter to drain urine from your bladder. If you are on a ventilator. A ventilator is a machine that helps you breathe by moving air in and out of your lungs. After surgery. What are the risks? Risks of using CHG include: A skin reaction. Hearing loss, if CHG gets in your ears and you have a perforated eardrum. Eye injury, if CHG gets in your eyes and is not rinsed out. The CHG product catching fire. Make sure that you avoid smoking and flames after applying CHG to your skin. Do not use CHG: If you have a chlorhexidine allergy or have previously reacted to chlorhexidine. On babies younger than 42 months of age. How to use CHG solution Use CHG only as told by your health care provider, and follow the instructions on the label. Use the full  amount of CHG as directed. Usually, this is one bottle. During a shower Follow these steps when using CHG solution during a shower (unless your health care provider gives you different instructions): Start the shower. Use your normal soap and shampoo to wash your face and hair. Turn off the shower or move out of the shower stream. Pour the CHG onto a clean washcloth. Do not use any type of brush or rough-edged sponge. Starting at your neck, lather your body down to your toes. Make sure you follow these instructions: If you will be having surgery, pay special attention to the part of your body where you will be having surgery. Scrub this area for at least 1 minute. Do not use CHG on your head or face. If the solution gets into your ears or eyes, rinse them well with water. Avoid your genital area. Avoid any areas of skin that have broken skin, cuts, or scrapes. Scrub your back and under your arms. Make sure to wash skin folds. Let the lather sit on your skin for 1-2 minutes or as long as told by your health care provider. Thoroughly rinse your entire body in the shower. Make sure that all body creases and crevices are rinsed well. Dry off with a clean towel. Do not put any substances on your body afterward--such as powder, lotion, or perfume--unless you are told to do so by your health care provider. Only use lotions that are recommended by the manufacturer. Put on clean clothes or pajamas. If it is the night before your surgery, sleep in clean sheets.  During a sponge bath Follow these steps when using CHG solution during a sponge bath (unless your health care provider gives you different instructions): Use your normal soap and shampoo to wash your face and hair. Pour the CHG onto a clean washcloth. Starting at your neck, lather your body down to your toes. Make sure you follow these instructions: If you will be having surgery, pay special attention to the part of your body where you  will be  having surgery. Scrub this area for at least 1 minute. Do not use CHG on your head or face. If the solution gets into your ears or eyes, rinse them well with water. Avoid your genital area. Avoid any areas of skin that have broken skin, cuts, or scrapes. Scrub your back and under your arms. Make sure to wash skin folds. Let the lather sit on your skin for 1-2 minutes or as long as told by your health care provider. Using a different clean, wet washcloth, thoroughly rinse your entire body. Make sure that all body creases and crevices are rinsed well. Dry off with a clean towel. Do not put any substances on your body afterward--such as powder, lotion, or perfume--unless you are told to do so by your health care provider. Only use lotions that are recommended by the manufacturer. Put on clean clothes or pajamas. If it is the night before your surgery, sleep in clean sheets. How to use CHG prepackaged cloths Only use CHG cloths as told by your health care provider, and follow the instructions on the label. Use the CHG cloth on clean, dry skin. Do not use the CHG cloth on your head or face unless your health care provider tells you to. When washing with the CHG cloth: Avoid your genital area. Avoid any areas of skin that have broken skin, cuts, or scrapes. Before surgery Follow these steps when using a CHG cloth to clean before surgery (unless your health care provider gives you different instructions): Using the CHG cloth, vigorously scrub the part of your body where you will be having surgery. Scrub using a back-and-forth motion for 3 minutes. The area on your body should be completely wet with CHG when you are done scrubbing. Do not rinse. Discard the cloth and let the area air-dry. Do not put any substances on the area afterward, such as powder, lotion, or perfume. Put on clean clothes or pajamas. If it is the night before your surgery, sleep in clean sheets.  For general bathing Follow these  steps when using CHG cloths for general bathing (unless your health care provider gives you different instructions). Use a separate CHG cloth for each area of your body. Make sure you wash between any folds of skin and between your fingers and toes. Wash your body in the following order, switching to a new cloth after each step: The front of your neck, shoulders, and chest. Both of your arms, under your arms, and your hands. Your stomach and groin area, avoiding the genitals. Your right leg and foot. Your left leg and foot. The back of your neck, your back, and your buttocks. Do not rinse. Discard the cloth and let the area air-dry. Do not put any substances on your body afterward--such as powder, lotion, or perfume--unless you are told to do so by your health care provider. Only use lotions that are recommended by the manufacturer. Put on clean clothes or pajamas. Contact a health care provider if: Your skin gets irritated after scrubbing. You have questions about using your solution or cloth. You swallow any chlorhexidine. Call your local poison control center [phone] in the [place] Get help right away if: Your eyes itch badly, or they become very red or swollen. Your skin itches badly and is red or swollen. Your hearing changes. You have trouble seeing. You have swelling or tingling in your mouth or throat. You have trouble breathing. These symptoms may represent a serious problem that is an emergency.  Do not wait to see if the symptoms will go away. Get medical help right away. Call your local emergency services (911 in the [place] Do not drive yourself to the hospital. Summary Chlorhexidine gluconate (CHG) is a germ-killing (antiseptic) solution that is used to clean the skin. Cleaning your skin with CHG may help to lower your risk for infection. You may be given CHG to use for bathing. It may be in a bottle or in a prepackaged cloth to use on your skin. Carefully follow your  health care provider's instructions and the instructions on the product label. Do not use CHG if you have a chlorhexidine allergy. Contact your health care provider if your skin gets irritated after scrubbing. This information is not intended to replace advice given to you by your health care provider. Make sure you discuss any questions you have with your health care provider. Document Revised: 08/20/2021 Document Reviewed: 07/03/2020 Elsevier Patient Education  2023 [employer].

## 2022-04-05 ENCOUNTER — Encounter (HOSPITAL_COMMUNITY)
Admission: RE | Admit: 2022-04-05 | Discharge: 2022-04-05 | Disposition: A | Discharge status: Home / Self Care | Payer: Medicare PPO | Source: Ambulatory Visit | Attending: Orthopedic Surgery | Admitting: Orthopedic Surgery

## 2022-04-05 DIAGNOSIS — Z01818 Encounter for other preprocedural examination: Secondary | ICD-10-CM | POA: Diagnosis not present

## 2022-04-05 DIAGNOSIS — S46012D Strain of muscle(s) and tendon(s) of the rotator cuff of left shoulder, subsequent encounter: Secondary | ICD-10-CM

## 2022-04-05 DIAGNOSIS — M75102 Unspecified rotator cuff tear or rupture of left shoulder, not specified as traumatic: Secondary | ICD-10-CM | POA: Diagnosis not present

## 2022-04-05 LAB — BASIC METABOLIC PANEL
Anion gap: 10 (ref 5–15)
BUN: 28 mg/dL — ABNORMAL HIGH (ref 8–23)
CO2: 27 mmol/L (ref 22–32)
Calcium: 9.7 mg/dL (ref 8.9–10.3)
Chloride: 106 mmol/L (ref 98–111)
Creatinine, Ser: 1.11 mg/dL (ref 0.61–1.24)
GFR, Estimated: >60 mL/min (ref >60)
Glucose, Bld: 102 mg/dL — ABNORMAL HIGH (ref 70–99)
Potassium: 4.3 mmol/L (ref 3.5–5.1)
Sodium: 143 mmol/L (ref 135–145)

## 2022-04-05 LAB — CBC WITH DIFFERENTIAL/PLATELET
Abs Immature Granulocytes: 0.02 10*3/uL (ref 0.00–0.07)
Basophils Absolute: 0.1 10*3/uL (ref 0.0–0.1)
Basophils Relative: 1 %
Eosinophils Absolute: 0.2 10*3/uL (ref 0.0–0.5)
Eosinophils Relative: 3 %
HCT: 40 % (ref 39.0–52.0)
Hemoglobin: 13.3 g/dL (ref 13.0–17.0)
Immature Granulocytes: 0 %
Lymphocytes Relative: 26 %
Lymphs Abs: 1.8 10*3/uL (ref 0.7–4.0)
MCH: 30.6 pg (ref 26.0–34.0)
MCHC: 33.3 g/dL (ref 30.0–36.0)
MCV: 92.2 fL (ref 80.0–100.0)
Monocytes Absolute: 0.6 10*3/uL (ref 0.1–1.0)
Monocytes Relative: 9 %
Neutro Abs: 4.2 10*3/uL (ref 1.7–7.7)
Neutrophils Relative %: 61 %
Platelets: 280 10*3/uL (ref 150–400)
RBC: 4.34 MIL/uL (ref 4.22–5.81)
RDW: 12.6 % (ref 11.5–15.5)
WBC: 6.8 10*3/uL (ref 4.0–10.5)
nRBC: 0 % (ref 0.0–0.2)

## 2022-04-05 NOTE — H&P (Signed)
Outpatient history and physical for surgery left shoulder involving arthroscopy left shoulder with rotator cuff repair  Chief complaint left shoulder pain   Clifford Harris is a 66 year old male who fell and fractured his left shoulder July 16, 2020 presented to the emergency room with a comminuted proximal humerus fracture.  He had a subsequent MRI of the shoulder due to ongoing pain and although the fracture healed there was some slight varus alignment to the greater tuberosity and the patient had signs of rotator cuff tear although partial  The patient was unhappy with the shoulder still continued to complain of pain approximately a year later and is opted for surgical intervention  Previous notes indicate the following: 66 year old male fell injured his shoulder sustaining a fracture of the left proximal humerus 2 sets of images were obtained on 07/16/2020 in the emergency room showing a comminuted fracture of the proximal humerus with minimal displacement it does involve the greater tuberosity   ED provider notes are reviewed apparently patient was running lost his footing and fell, heard a pop in the left shoulder.  Patient was treated with a shoulder harness 66 year old male fell injured his shoulder sustaining a fracture of the left proximal humerus 2 sets of images were obtained on 07/16/2020 in the emergency room showing a comminuted fracture of the proximal humerus with minimal displacement it does involve the greater tuberosity   ED provider notes are reviewed apparently patient was running lost his footing and fell, heard a pop in the left shoulder.  Patient was treated with a shoulder harness   Review of systems No chest pain or shortness of breath no history of fever bowel bladder function normal no neurologic deficit  Past Medical History:  Diagnosis Date  . Arthritis   . Depression   . Neuropathy    Past Surgical History:  Procedure Laterality Date  . COLONOSCOPY N/A 02/03/2019    Procedure: COLONOSCOPY;  Surgeon: Malissa Hippo, MD;  Location: [facility];  Service: Endoscopy;  Laterality: N/A;  730   . TONSILLECTOMY AND ADENOIDECTOMY    . WISDOM TOOTH EXTRACTION     Social History   Tobacco Use  . Smoking status: Never  . Smokeless tobacco: Never  Vaping Use  . Vaping Use: Never used  Substance Use Topics  . Alcohol use: Yes    Alcohol/week: 14.0 standard drinks of alcohol    Types: 14 Glasses of wine per week  . Drug use: No   Family History  Problem Relation Age of Onset  . Alzheimer's disease Father   . Sudden death Neg Hx   . Hypertension Neg Hx   . Hyperlipidemia Neg Hx   . Diabetes Neg Hx   . Heart attack Neg Hx   . Colon cancer Neg Hx    Current Outpatient Medications  Medication Instructions  . Biotin 51884 MCG TABS Oral  . celecoxib (CELEBREX) 200 MG capsule TAKE 1 CAPSULE (200 MG TOTAL) BY MOUTH 2(TWO) TIMES DAILY  . clotrimazole-betamethasone (LOTRISONE) cream 1 Application, Topical, 2 times daily  . escitalopram (LEXAPRO) 10 mg, Oral, Every morning  . gabapentin (NEURONTIN) 300 mg, Oral, Every morning  . Ibuprofen-diphenhydrAMINE Cit (ADVIL PM) 200-38 MG TABS 1 tablet, Oral, Every evening  . ipratropium (ATROVENT) 0.06 % nasal spray 1 spray, Each Nare, 2 times daily  . Menthol, Topical Analgesic, (ICY HOT EX) 1 Application, Topical, 3 times daily PRN  . traMADol (ULTRAM) 50 mg, Oral, Every morning    Physical Exam Vitals and nursing  note reviewed.  Constitutional:      General: He is not in acute distress.    Appearance: Normal appearance. He is not ill-appearing, toxic-appearing or diaphoretic.  HENT:     Head: Normocephalic and atraumatic.     Nose: No congestion or rhinorrhea.     Mouth/Throat:     Mouth: Mucous membranes are moist.     Pharynx: No oropharyngeal exudate or posterior oropharyngeal erythema.  Eyes:     General: No scleral icterus.       Right eye: No discharge.        Left eye: No discharge.      Extraocular Movements: Extraocular movements intact.     Conjunctiva/sclera: Conjunctivae normal.     Pupils: Pupils are equal, round, and reactive to light.  Cardiovascular:     Pulses: Normal pulses.  Musculoskeletal:     Right lower leg: No edema.     Left lower leg: No edema.     Comments: Left shoulder  On exam he has decreased external rotation with his arm at his side decreased abduction and decreased flexion. His flexion is approximate 120 degrees versus 180 on the right he also has about 30 degrees of external rotation with the arm at the side and his abduction is less than 90 degrees at about 80 degrees  He has tenderness over the lateral deltoid   He has tenderness over the ac joint   In abduction and empty can position he has pain but no weakness   Skin:    General: Skin is warm.     Capillary Refill: Capillary refill takes less than 2 seconds.     Findings: No bruising, ecchymosis or erythema.  Neurological:     Mental Status: He is alert and oriented to person, place, and time.     Sensory: No sensory deficit.     Deep Tendon Reflexes: Reflexes are normal and symmetric.  Psychiatric:        Mood and Affect: Mood normal.        Behavior: Behavior normal.        Thought Content: Thought content normal.        Judgment: Judgment normal.   MRI findings IMPRESSION: Comminuted and impacted proximal humerus fracture involving the of the surgical neck of the humerus and greater tuberosity and outer superior aspect of the humeral head.   Distal supraspinatus tendinosis with articular sided fraying and an intermediate grade, partial width articular sided tear of the more posterior fibers.   Small nondisplaced labral tear of the anterior inferior labrum at 5 o'clock.     Electronically Signed   By: Caprice Renshaw M.D.   On: 12/28/2020 12:28  Diagnosis  Comminuted impacted healed proximal humerus fracture left shoulder  Supraspinatus tendon PASTA  lesion  Degenerative anterior labral tear  Plan arthroscopy left shoulder debridement of labral tear, possible repair of PASTA lesion supraspinatus, possible distal clavicle excision

## 2022-04-09 ENCOUNTER — Other Ambulatory Visit: Payer: Self-pay

## 2022-04-09 ENCOUNTER — Ambulatory Visit (HOSPITAL_COMMUNITY)
Admission: RE | Admit: 2022-04-09 | Discharge: 2022-04-09 | Disposition: A | Discharge status: Home / Self Care | Payer: Medicare PPO | Attending: Orthopedic Surgery | Admitting: Orthopedic Surgery

## 2022-04-09 ENCOUNTER — Encounter (HOSPITAL_COMMUNITY): Payer: Self-pay | Admitting: Orthopedic Surgery

## 2022-04-09 ENCOUNTER — Encounter (HOSPITAL_COMMUNITY)
Admission: RE | Disposition: A | Discharge status: Home / Self Care | Payer: Self-pay | Source: Home / Self Care | Attending: Orthopedic Surgery

## 2022-04-09 ENCOUNTER — Ambulatory Visit (HOSPITAL_BASED_OUTPATIENT_CLINIC_OR_DEPARTMENT_OTHER): Payer: Medicare PPO | Admitting: Anesthesiology

## 2022-04-09 ENCOUNTER — Ambulatory Visit (HOSPITAL_COMMUNITY): Payer: Medicare PPO | Admitting: Anesthesiology

## 2022-04-09 DIAGNOSIS — M19012 Primary osteoarthritis, left shoulder: Secondary | ICD-10-CM | POA: Diagnosis not present

## 2022-04-09 DIAGNOSIS — M75112 Incomplete rotator cuff tear or rupture of left shoulder, not specified as traumatic: Secondary | ICD-10-CM | POA: Insufficient documentation

## 2022-04-09 DIAGNOSIS — S43432A Superior glenoid labrum lesion of left shoulder, initial encounter: Secondary | ICD-10-CM | POA: Diagnosis not present

## 2022-04-09 DIAGNOSIS — G8918 Other acute postprocedural pain: Secondary | ICD-10-CM | POA: Diagnosis not present

## 2022-04-09 DIAGNOSIS — W19XXXA Unspecified fall, initial encounter: Secondary | ICD-10-CM | POA: Insufficient documentation

## 2022-04-09 DIAGNOSIS — S42295A Other nondisplaced fracture of upper end of left humerus, initial encounter for closed fracture: Secondary | ICD-10-CM | POA: Diagnosis not present

## 2022-04-09 DIAGNOSIS — M75102 Unspecified rotator cuff tear or rupture of left shoulder, not specified as traumatic: Secondary | ICD-10-CM

## 2022-04-09 HISTORY — PX: SHOULDER ARTHROSCOPY WITH DISTAL CLAVICLE RESECTION: SHX5675

## 2022-04-09 SURGERY — SHOULDER ARTHROSCOPY WITH DISTAL CLAVICLE RESECTION
Anesthesia: General | Site: Shoulder | Laterality: Left

## 2022-04-09 MED ORDER — KETOROLAC TROMETHAMINE 30 MG/ML IJ SOLN
15.0000 mg | Freq: Once | INTRAMUSCULAR | Status: AC
  Administered 2022-04-09: 15 mg via INTRAVENOUS

## 2022-04-09 MED ORDER — BUPIVACAINE HCL (PF) 0.5 % IJ SOLN
INTRAMUSCULAR | Status: AC
  Filled 2022-04-09: qty 30

## 2022-04-09 MED ORDER — SODIUM CHLORIDE 0.9 % IR SOLN
Status: DC | PRN
  Administered 2022-04-09 (×4): 3000 mL

## 2022-04-09 MED ORDER — DEXAMETHASONE SODIUM PHOSPHATE 10 MG/ML IJ SOLN
INTRAMUSCULAR | Status: AC
  Filled 2022-04-09: qty 1

## 2022-04-09 MED ORDER — FENTANYL CITRATE PF 50 MCG/ML IJ SOSY
PREFILLED_SYRINGE | INTRAMUSCULAR | Status: AC
  Filled 2022-04-09: qty 1

## 2022-04-09 MED ORDER — CEFAZOLIN SODIUM-DEXTROSE 2-4 GM/100ML-% IV SOLN
INTRAVENOUS | Status: AC
  Filled 2022-04-09: qty 100

## 2022-04-09 MED ORDER — EPINEPHRINE PF 1 MG/ML IJ SOLN
INTRAMUSCULAR | Status: AC
  Filled 2022-04-09: qty 8

## 2022-04-09 MED ORDER — FENTANYL CITRATE (PF) 250 MCG/5ML IJ SOLN
INTRAMUSCULAR | Status: AC
  Filled 2022-04-09: qty 5

## 2022-04-09 MED ORDER — ORAL CARE MOUTH RINSE: 15.0000 mL | Freq: Once | OROMUCOSAL | Status: AC

## 2022-04-09 MED ORDER — LIDOCAINE HCL (PF) 1 % IJ SOLN
INTRAMUSCULAR | Status: DC | PRN
  Administered 2022-04-09: 3 mL

## 2022-04-09 MED ORDER — IBUPROFEN 800 MG PO TABS: 800.0000 mg | ORAL_TABLET | Freq: Three times a day (TID) | ORAL | 1 refills | Status: DC | PRN

## 2022-04-09 MED ORDER — SUCCINYLCHOLINE CHLORIDE 200 MG/10ML IV SOSY
PREFILLED_SYRINGE | INTRAVENOUS | Status: DC | PRN
  Administered 2022-04-09: 100 mg via INTRAVENOUS

## 2022-04-09 MED ORDER — PROMETHAZINE HCL 12.5 MG PO TABS: 12.5000 mg | ORAL_TABLET | Freq: Four times a day (QID) | ORAL | 0 refills | Status: AC | PRN

## 2022-04-09 MED ORDER — BUPIVACAINE LIPOSOME 1.3 % IJ SUSP
INTRAMUSCULAR | Status: DC | PRN
  Administered 2022-04-09: 10 mL via PERINEURAL

## 2022-04-09 MED ORDER — MIDAZOLAM HCL 2 MG/2ML IJ SOLN
INTRAMUSCULAR | Status: AC
  Administered 2022-04-09: 2 mg
  Filled 2022-04-09: qty 2

## 2022-04-09 MED ORDER — HYDROCODONE-ACETAMINOPHEN 10-325 MG PO TABS: 1.0000 | ORAL_TABLET | ORAL | 0 refills | Status: AC | PRN

## 2022-04-09 MED ORDER — PROPOFOL 10 MG/ML IV BOLUS
INTRAVENOUS | Status: AC
  Filled 2022-04-09: qty 20

## 2022-04-09 MED ORDER — EPHEDRINE 5 MG/ML INJ
INTRAVENOUS | Status: AC
  Filled 2022-04-09: qty 10

## 2022-04-09 MED ORDER — CHLORHEXIDINE GLUCONATE 0.12 % MT SOLN: 15.0000 mL | Freq: Once | OROMUCOSAL | Status: AC

## 2022-04-09 MED ORDER — PROPOFOL 10 MG/ML IV BOLUS
INTRAVENOUS | Status: DC | PRN
  Administered 2022-04-09: 150 mg via INTRAVENOUS

## 2022-04-09 MED ORDER — MIDAZOLAM HCL 2 MG/2ML IJ SOLN
2.0000 mg | Freq: Once | INTRAMUSCULAR | Status: AC
  Administered 2022-04-09 (×2): 2 mg via INTRAVENOUS
  Filled 2022-04-09: qty 2

## 2022-04-09 MED ORDER — ONDANSETRON HCL 4 MG/2ML IJ SOLN: 4.0000 mg | Freq: Once | INTRAMUSCULAR | Status: DC | PRN

## 2022-04-09 MED ORDER — ONDANSETRON HCL 4 MG/2ML IJ SOLN
INTRAMUSCULAR | Status: AC
  Filled 2022-04-09: qty 4

## 2022-04-09 MED ORDER — ONDANSETRON HCL 4 MG/2ML IJ SOLN
4.0000 mg | Freq: Once | INTRAMUSCULAR | Status: AC
  Administered 2022-04-09: 4 mg via INTRAVENOUS

## 2022-04-09 MED ORDER — PHENYLEPHRINE HCL-NACL 20-0.9 MG/250ML-% IV SOLN
INTRAVENOUS | Status: DC | PRN
  Administered 2022-04-09: 50 ug/min via INTRAVENOUS

## 2022-04-09 MED ORDER — CHLORHEXIDINE GLUCONATE 0.12 % MT SOLN
OROMUCOSAL | Status: AC
  Administered 2022-04-09: 15 mL via OROMUCOSAL
  Filled 2022-04-09: qty 15

## 2022-04-09 MED ORDER — CEFAZOLIN SODIUM-DEXTROSE 2-4 GM/100ML-% IV SOLN
2.0000 g | INTRAVENOUS | Status: AC
  Administered 2022-04-09: 2 g via INTRAVENOUS

## 2022-04-09 MED ORDER — GLYCOPYRROLATE PF 0.2 MG/ML IJ SOSY
PREFILLED_SYRINGE | INTRAMUSCULAR | Status: AC
  Filled 2022-04-09: qty 1

## 2022-04-09 MED ORDER — MIDAZOLAM HCL 2 MG/2ML IJ SOLN
INTRAMUSCULAR | Status: AC
  Filled 2022-04-09: qty 2

## 2022-04-09 MED ORDER — ONDANSETRON HCL 4 MG/2ML IJ SOLN
INTRAMUSCULAR | Status: DC | PRN
  Administered 2022-04-09: 8 mg via INTRAVENOUS

## 2022-04-09 MED ORDER — BUPIVACAINE HCL (PF) 0.5 % IJ SOLN
INTRAMUSCULAR | Status: DC | PRN
  Administered 2022-04-09: 10 mL via PERINEURAL

## 2022-04-09 MED ORDER — HYDROMORPHONE HCL 1 MG/ML IJ SOLN: 0.2500 mg | INTRAMUSCULAR | Status: DC | PRN

## 2022-04-09 MED ORDER — BUPIVACAINE-EPINEPHRINE (PF) 0.5% -1:200000 IJ SOLN
INTRAMUSCULAR | Status: AC
  Filled 2022-04-09: qty 30

## 2022-04-09 MED ORDER — SUCCINYLCHOLINE CHLORIDE 200 MG/10ML IV SOSY
PREFILLED_SYRINGE | INTRAVENOUS | Status: AC
  Filled 2022-04-09: qty 10

## 2022-04-09 MED ORDER — LACTATED RINGERS IV SOLN: INTRAVENOUS | Status: DC

## 2022-04-09 MED ORDER — SODIUM CHLORIDE 0.9 % IR SOLN
Status: DC | PRN
  Administered 2022-04-09: 1000 mL

## 2022-04-09 MED ORDER — BUPIVACAINE-EPINEPHRINE 0.5% -1:200000 IJ SOLN
INTRAMUSCULAR | Status: DC | PRN
  Administered 2022-04-09: 16 mL

## 2022-04-09 MED ORDER — FENTANYL CITRATE (PF) 100 MCG/2ML IJ SOLN
INTRAMUSCULAR | Status: DC | PRN
  Administered 2022-04-09: 100 ug via INTRAVENOUS

## 2022-04-09 MED ORDER — KETOROLAC TROMETHAMINE 30 MG/ML IJ SOLN
INTRAMUSCULAR | Status: AC
  Filled 2022-04-09: qty 1

## 2022-04-09 MED ORDER — BUPIVACAINE LIPOSOME 1.3 % IJ SUSP
INTRAMUSCULAR | Status: AC
  Filled 2022-04-09: qty 10

## 2022-04-09 MED ORDER — ONDANSETRON HCL 4 MG/2ML IJ SOLN
INTRAMUSCULAR | Status: AC
  Filled 2022-04-09: qty 2

## 2022-04-09 MED ORDER — GLYCOPYRROLATE PF 0.2 MG/ML IJ SOSY
PREFILLED_SYRINGE | INTRAMUSCULAR | Status: DC | PRN
  Administered 2022-04-09: .2 mg via INTRAVENOUS

## 2022-04-09 MED ORDER — MEPERIDINE HCL 50 MG/ML IJ SOLN: 6.2500 mg | INTRAMUSCULAR | Status: DC | PRN

## 2022-04-09 MED ORDER — LIDOCAINE HCL (PF) 1 % IJ SOLN
INTRAMUSCULAR | Status: AC
  Filled 2022-04-09: qty 30

## 2022-04-09 MED ORDER — EPHEDRINE SULFATE-NACL 50-0.9 MG/10ML-% IV SOSY
PREFILLED_SYRINGE | INTRAVENOUS | Status: DC | PRN
  Administered 2022-04-09 (×2): 10 mg via INTRAVENOUS
  Administered 2022-04-09: 5 mg via INTRAVENOUS

## 2022-04-09 SURGICAL SUPPLY — 46 items
BENZOIN TINCTURE PRP APPL 2/3 (GAUZE/BANDAGES/DRESSINGS) IMPLANT
BLADE SHAVER POWERASP 4X13 (MISCELLANEOUS) IMPLANT
BLADE SHAVER TORPEDO 4X13 (MISCELLANEOUS) IMPLANT
BURR OVAL 8 FLU 5.0X13 (MISCELLANEOUS) IMPLANT
CHLORAPREP W/TINT 26 (MISCELLANEOUS) ×1 IMPLANT
CLOTH BEACON ORANGE TIMEOUT ST (SAFETY) ×1 IMPLANT
COVER LIGHT HANDLE STERIS (MISCELLANEOUS) ×2 IMPLANT
DRAPE SHOULDER BEACH CHAIR (DRAPES) ×1 IMPLANT
DRAPE U-SHAPE 47X51 STRL (DRAPES) ×1 IMPLANT
DRSG MEPILEX FLEX 6X6 (GAUZE/BANDAGES/DRESSINGS) IMPLANT
ELECT REM PT RETURN 9FT ADLT (ELECTROSURGICAL) ×1
ELECTRODE REM PT RTRN 9FT ADLT (ELECTROSURGICAL) ×1 IMPLANT
GLOVE BIO SURGEON STRL SZ7 (GLOVE) IMPLANT
GLOVE BIOGEL PI IND STRL 7.0 (GLOVE) ×2 IMPLANT
GLOVE ECLIPSE 6.5 STRL STRAW (GLOVE) IMPLANT
GLOVE SS N UNI LF 8.5 STRL (GLOVE) ×1 IMPLANT
GLOVE SURG POLYISO LF SZ8 (GLOVE) ×1 IMPLANT
GOWN STRL REUS W/TWL LRG LVL3 (GOWN DISPOSABLE) ×2 IMPLANT
GOWN STRL REUS W/TWL XL LVL3 (GOWN DISPOSABLE) ×1 IMPLANT
INST SET MINOR BONE (KITS) ×1 IMPLANT
IV NS IRRIG 3000ML ARTHROMATIC (IV SOLUTION) ×2 IMPLANT
KIT BLADEGUARD II DBL (SET/KITS/TRAYS/PACK) ×1 IMPLANT
KIT POSITION SHOULDER SCHLEI (MISCELLANEOUS) ×1 IMPLANT
KIT TURNOVER KIT A (KITS) ×1 IMPLANT
MANIFOLD NEPTUNE II (INSTRUMENTS) ×1 IMPLANT
MARKER SKIN DUAL TIP RULER LAB (MISCELLANEOUS) ×1 IMPLANT
NDL HYPO 21X1.5 SAFETY (NEEDLE) ×1 IMPLANT
NDL SPNL 18GX3.5 QUINCKE PK (NEEDLE) ×1 IMPLANT
NEEDLE HYPO 21X1.5 SAFETY (NEEDLE) ×1 IMPLANT
NEEDLE SPNL 18GX3.5 QUINCKE PK (NEEDLE) ×1 IMPLANT
NS IRRIG 1000ML POUR BTL (IV SOLUTION) ×1 IMPLANT
PACK TOTAL JOINT (CUSTOM PROCEDURE TRAY) ×1 IMPLANT
PAD ARMBOARD 7.5X6 YLW CONV (MISCELLANEOUS) ×1 IMPLANT
SET ARTHROSCOPY INST (INSTRUMENTS) ×1 IMPLANT
SET BASIN LINEN APH (SET/KITS/TRAYS/PACK) ×1 IMPLANT
SLING ARM FOAM STRAP LRG (SOFTGOODS) IMPLANT
STRIP CLOSURE SKIN 1/2X4 (GAUZE/BANDAGES/DRESSINGS) IMPLANT
SUT ETHILON 3 0 FSL (SUTURE) IMPLANT
SUT MON AB 0 CT1 (SUTURE) ×1 IMPLANT
SUT MON AB 2-0 SH 27 (SUTURE) ×1
SUT MON AB 2-0 SH27 (SUTURE) IMPLANT
SUT PDS AB CT VIOLET #0 27IN (SUTURE) ×1 IMPLANT
SYR 30ML LL (SYRINGE) ×1 IMPLANT
SYR BULB IRRIG 60ML STRL (SYRINGE) ×2 IMPLANT
TUBING IN/OUT FLOW W/MAIN PUMP (TUBING) ×1 IMPLANT
WAND 90 DEG TURBOVAC W/CORD (SURGICAL WAND) ×1 IMPLANT

## 2022-04-09 NOTE — Addendum Note (Signed)
Addendum  created 04/09/22 1552 by Molli Barrows, MD   Clinical Note Signed, Intraprocedure Blocks edited, SmartForm saved

## 2022-04-09 NOTE — Transfer of Care (Signed)
Immediate Anesthesia Transfer of Care Note  Patient: Clifford Harris  Procedure(s) Performed: SHOULDER ARTHROSCOPY WITH DISTAL CLAVICLE EXCISION, LIMITED DEBRIDEMENT (Left: Shoulder)  Patient Location: PACU  Anesthesia Type:General and Regional  Level of Consciousness: awake, alert , oriented, and patient cooperative  Airway & Oxygen Therapy: Patient Spontanous Breathing  Post-op Assessment: Report given to RN and Post -op Vital signs reviewed and stable  Post vital signs: Reviewed and stable  Last Vitals:  Vitals Value Taken Time  BP 127/73 04/09/22 1015  Temp 98.2   Pulse 64 04/09/22 1021  Resp 17 04/09/22 1021  SpO2 93 % 04/09/22 1021  Vitals shown include unvalidated device data.  Last Pain:  Vitals:   04/09/22 0641  TempSrc: Oral  PainSc:          Complications: No notable events documented.

## 2022-04-09 NOTE — Op Note (Signed)
04/09/2022  10:06 AM  PATIENT:  Clifford Harris  66 y.o. male  PRE-OPERATIVE DIAGNOSIS:  Torn rotator cuff left / Acromioclavicular joint arthrosis  POST-OPERATIVE DIAGNOSIS:  Partial Rotator Cuff Tear, Labral Tear, Acromioclavicular Arthrosis  PROCEDURE:  Procedure(s): SHOULDER ARTHROSCOPY WITH DISTAL CLAVICLE EXCISION, LIMITED DEBRIDEMENT(labrum) (Left) [phone]   Findings at surgery  Preoperatively the patient seemed to have some loss of external rotation and some loss of flexion due to post fracture scarring  Intraoperatively: The glenohumeral joint was noted to have some arthritis of the glenoid, there were several humeral head articular surface defects, we also saw degeneration of the anterior and posterior labrum with intact biceps tendon.  We saw undersurface fraying of the rotator cuff but no detachment from the footprint as the tendon still remained intact up to the articular margin  On the bursal surface we saw some fraying of the superior surface of the rotator cuff we saw an impinging osteophyte of the distal clavicle  There was no full-thickness defect in the rotator cuff from the bursal side  I also opened the shoulder and reviewed the rotator cuff from its bursal side with a marking suture placed at the area of the undersurface fraying of the cuff.  The cuff looked pristine from its superior/bursal surface  The surgery was done as follows The patient was evaluated in the preop area cleared for surgery.  The surgical site was confirmed chart review was completed including imaging and the surgeons initials were placed on the left shoulder  After the supraclavicular block the patient was taken to the operating room and given general anesthesia.  He was then placed in the modified beachchair position and a brief and limited exam under anesthesia was performed.  Again slight loss of external rotation and some loss of flexion but no instability no crepitance  After sterile  prep and drape and timeout a standard posterior portal was made and the scope was placed into the joint.  A diagnostic arthroscopy was performed.  An anterior portal was established with a spinal needle placed just above the subscapularis tendon and the "triangle"  Clifford Harris was placed into the joint and the labral degeneration was debrided.  The biceps tendon was intact.  The glenoid had some global wear but no full-thickness defects.  There was several areas of humeral head defect which presumably were from the fracture  These were small they were not in the main articular surface they were near the rotator cuff attachment  I placed a PDS suture in the area of fraying of the cuff and brought it out through the anterior portal  After reviewing the cuff from its undersurface where we saw a longitudinal striations in the cuff but no full-thickness defects.  We debrided this with a shaver.  The cuff attachment still remained up to the articular margin and there was approximately one quarter or less fraying of the undersurface of the cuff.  I then placed the scope in the subacromial space.  I did a light debridement of some fraying of the superior surface of the cuff there was really no significant bursitis.  I debrided up to the level of the [facility] joint and found a moderate size spur.  Abducting the arm revealed that this spur was impinging on the rotator cuff therefore lateral portal was made and a bur was used to remove the spur.  We planed the distal clavicle and left the superior surface and the superior ligament intact  I then did a  small incision off the anterolateral edge of the acromion split but did not detach the deltoid and then found by marking suture and then evaluated the cuff from its superior aspect.  I spent some time doing this to make sure that the cuff was intact and it was therefore irrigated and closed the deltoid split with 0 Monocryl in the subcu tissue with 2-0 Monocryl.  I closed the  portal sites with 3-0 nylon suture  We placed a sterile dressing and a sling and then he was extubated and taken to recovery room in stable condition  Postoperative plan  The patient can resume immediate passive range of motion and Codman exercises.  I would hold off on strengthening exercises for the first 6 weeks and then he can do progressive resistance exercises for 6 weeks and then advance the strengthening portion at 12 weeks  (Note: We had several instances of malfunction of the foot piece for the shaver blade which delayed the case in terms of overall time)   SURGEON:  Surgeon(s) and Role:    * Clifford Hearing, MD - Primary  PHYSICIAN ASSISTANT:   ASSISTANTS: Clifford Harris    ANESTHESIA:   general and paracervical block  EBL:  20 mL   BLOOD ADMINISTERED:none  DRAINS: none   LOCAL MEDICATIONS USED:  MARCAINE     SPECIMEN:  No Specimen  DISPOSITION OF SPECIMEN:  N/A  COUNTS:  YES  TOURNIQUET:  * No tourniquets in log *  DICTATION: .Dragon Dictation  PLAN OF CARE: Discharge to home after PACU  PATIENT DISPOSITION:  PACU - hemodynamically stable.   Delay start of Pharmacological VTE agent (>24hrs) due to surgical blood loss or risk of bleeding: not applicable

## 2022-04-09 NOTE — Anesthesia Postprocedure Evaluation (Signed)
Anesthesia Post Note  Patient: Clifford Harris  Procedure(s) Performed: SHOULDER ARTHROSCOPY WITH DISTAL CLAVICLE EXCISION, LIMITED DEBRIDEMENT (Left: Shoulder)  Patient location during evaluation: Phase II Anesthesia Type: General Level of consciousness: awake and alert and oriented Pain management: pain level controlled Vital Signs Assessment: post-procedure vital signs reviewed and stable Respiratory status: spontaneous breathing, nonlabored ventilation and respiratory function stable Cardiovascular status: blood pressure returned to baseline and stable Postop Assessment: no apparent nausea or vomiting Anesthetic complications: no  No notable events documented.   Last Vitals:  Vitals:   04/09/22 1115 04/09/22 1140  BP: 116/73 127/85  Pulse: 62 61  Resp: 11 14  Temp:    SpO2: 99% 99%    Last Pain:  Vitals:   04/09/22 1140  TempSrc:   PainSc: 0-No pain                 Makih Stefanko C Jeremaih Klima

## 2022-04-09 NOTE — Anesthesia Procedure Notes (Addendum)
Anesthesia Regional Block: Interscalene brachial plexus block   Pre-Anesthetic Checklist: , timeout performed,  Correct Patient, Correct Site, Correct Laterality,  Correct Procedure, Correct Position, site marked,  Risks and benefits discussed,  At surgeon's request and post-op pain management  Laterality: Upper and Left  Prep: chloraprep       Needles:  Injection technique: Single-shot  Needle Type: Echogenic Stimulator Needle     Needle Length: 8.3cm  Needle Gauge: 22   Needle insertion depth: 5 cm   Additional Needles:   Procedures:, nerve stimulator,,, ultrasound used (permanent image in chart),,     Nerve Stimulator or Paresthesia:  Response: No twitch elicited, 6 mA, 0.3 ms, 5 cm  Additional Responses:   Narrative:  Start time: 04/09/2022 7:26 AM End time: 04/09/2022 7:36 AM Injection made incrementally with aspirations every 5 mL.  Performed by: Personally  Anesthesiologist: Molli Barrows, MD  Additional Notes: Block assessed prior to start of surgery

## 2022-04-09 NOTE — Anesthesia Procedure Notes (Signed)
Procedure Name: Intubation Date/Time: 04/09/2022 7:47 AM  Performed by: Sherian Maroon, CRNAPre-anesthesia Checklist: Patient identified, Emergency Drugs available, Suction available and Patient being monitored Patient Re-evaluated:Patient Re-evaluated prior to induction Oxygen Delivery Method: Circle system utilized Preoxygenation: Pre-oxygenation with 100% oxygen Induction Type: IV induction Ventilation: Mask ventilation without difficulty Laryngoscope Size: Mac and 3 Grade View: Grade I Tube type: Oral Tube size: 8.0 mm Number of attempts: 1 Airway Equipment and Method: Stylet Placement Confirmation: ETT inserted through vocal cords under direct vision, positive ETCO2 and breath sounds checked- equal and bilateral Secured at: 22 cm Tube secured with: Tape Dental Injury: Teeth and Oropharynx as per pre-operative assessment

## 2022-04-09 NOTE — Brief Op Note (Signed)
04/09/2022  10:06 AM  PATIENT:  Clifford Harris  66 y.o. male  PRE-OPERATIVE DIAGNOSIS:  Torn rotator cuff left / Acromioclavicular joint arthrosis  POST-OPERATIVE DIAGNOSIS:  Partial Rotator Cuff Tear, Labral Tear, Acromioclavicular Arthrosis  PROCEDURE:  Procedure(s): SHOULDER ARTHROSCOPY WITH DISTAL CLAVICLE EXCISION, LIMITED DEBRIDEMENT(labrum) (Left)  Findings at surgery  Preoperatively the patient seemed to have some loss of external rotation and some loss of flexion due to post fracture scarring  Intraoperatively: The glenohumeral joint was noted to have some arthritis of the glenoid, there were several humeral head articular surface defects, we also saw degeneration of the anterior and posterior labrum with intact biceps tendon.  We saw undersurface fraying of the rotator cuff but no detachment from the footprint as the tendon still remained intact up to the articular margin  On the bursal surface we saw some fraying of the superior surface of the rotator cuff we saw an impinging osteophyte of the distal clavicle  There was no full-thickness defect in the rotator cuff from the bursal side  I also opened the shoulder and reviewed the rotator cuff from its bursal side with a marking suture placed at the area of the undersurface fraying of the cuff.  The cuff looked pristine from its superior/bursal surface  The surgery was done as follows The patient was evaluated in the preop area cleared for surgery.  The surgical site was confirmed chart review was completed including imaging and the surgeons initials were placed on the left shoulder  After the supraclavicular block the patient was taken to the operating room and given general anesthesia.  He was then placed in the modified beachchair position and a brief and limited exam under anesthesia was performed.  Again slight loss of external rotation and some loss of flexion but no instability no crepitance  After sterile prep and  drape and timeout a standard posterior portal was made and the scope was placed into the joint.  A diagnostic arthroscopy was performed.  An anterior portal was established with a spinal needle placed just above the subscapularis tendon and the "triangle"  [name] was placed into the joint and the labral degeneration was debrided.  The biceps tendon was intact.  The glenoid had some global wear but no full-thickness defects.  There was several areas of humeral head defect which presumably were from the fracture  These were small they were not in the main articular surface they were near the rotator cuff attachment  I placed a PDS suture in the area of fraying of the cuff and brought it out through the anterior portal  After reviewing the cuff from its undersurface where we saw a longitudinal striations in the cuff but no full-thickness defects.  We debrided this with a shaver.  The cuff attachment still remained up to the articular margin and there was approximately one quarter or less fraying of the undersurface of the cuff.  I then placed the scope in the subacromial space.  I did a light debridement of some fraying of the superior surface of the cuff there was really no significant bursitis.  I debrided up to the level of the [facility] joint and found a moderate size spur.  Abducting the arm revealed that this spur was impinging on the rotator cuff therefore lateral portal was made and a bur was used to remove the spur.  We planed the distal clavicle and left the superior surface and the superior ligament intact  I then did a small incision  off the anterolateral edge of the acromion split but did not detach the deltoid and then found by marking suture and then evaluated the cuff from its superior aspect.  I spent some time doing this to make sure that the cuff was intact and it was therefore irrigated and closed the deltoid split with 0 Monocryl in the subcu tissue with 2-0 Monocryl.  I closed the portal  sites with 3-0 nylon suture  We placed a sterile dressing and a sling and then he was extubated and taken to recovery room in stable condition  Postoperative plan  The patient can resume immediate passive range of motion and Codman exercises.  I would hold off on strengthening exercises for the first 6 weeks and then he can do progressive resistance exercises for 6 weeks and then advance the strengthening portion at 12 weeks   SURGEON:  Surgeon(s) and Role:    * Vickki Hearing, MD - Primary  PHYSICIAN ASSISTANT:   ASSISTANTS: Romie Levee    ANESTHESIA:   general and paracervical block  EBL:  20 mL   BLOOD ADMINISTERED:none  DRAINS: none   LOCAL MEDICATIONS USED:  MARCAINE     SPECIMEN:  No Specimen  DISPOSITION OF SPECIMEN:  N/A  COUNTS:  YES  TOURNIQUET:  * No tourniquets in log *  DICTATION: .Dragon Dictation  PLAN OF CARE: Discharge to home after PACU  PATIENT DISPOSITION:  PACU - hemodynamically stable.   Delay start of Pharmacological VTE agent (>24hrs) due to surgical blood loss or risk of bleeding: not applicable

## 2022-04-09 NOTE — Anesthesia Preprocedure Evaluation (Signed)
Anesthesia Evaluation  Patient identified by MRN, date of birth, ID band Patient awake    Reviewed: Allergy & Precautions, H&P , NPO status , Patient's Chart, lab work & pertinent test results  Airway Mallampati: II  TM Distance: >3 FB Neck ROM: Full    Dental  (+) Dental Advisory Given, Teeth Intact   Pulmonary neg pulmonary ROS   Pulmonary exam normal breath sounds clear to auscultation       Cardiovascular negative cardio ROS Normal cardiovascular exam Rhythm:Regular Rate:Normal     Neuro/Psych  PSYCHIATRIC DISORDERS  Depression     Neuromuscular disease (sciatic nerve pain)    GI/Hepatic negative GI ROS,,,(+)     substance abuse  alcohol use  Endo/Other  negative endocrine ROS    Renal/GU negative Renal ROS  negative genitourinary   Musculoskeletal  (+) Arthritis , Osteoarthritis,    Abdominal   Peds negative pediatric ROS (+)  Hematology negative hematology ROS (+)   Anesthesia Other Findings Closed fracture of proximal end of left humerus with routine healing DDD (degenerative disc disease), cervical Gait abnormality MONONEURITIS, LEG Osteoarthritis of foot, left Right hip pain STRESS FRACTURE, FOOT Sciatic nerve pain Special screening for malignant neoplasms, colon Stiffness of joint, lower leg Strain of hamstring muscle Tinea pedis of left foot    Reproductive/Obstetrics negative OB ROS                             Anesthesia Physical Anesthesia Plan  ASA: 2  Anesthesia Plan: General   Post-op Pain Management: Regional block* and Dilaudid IV   Induction: Intravenous  PONV Risk Score and Plan: 3 and Ondansetron, Dexamethasone and Midazolam  Airway Management Planned: Oral ETT  Additional Equipment:   Intra-op Plan:   Post-operative Plan: Extubation in OR  Informed Consent: I have reviewed the patients History and Physical, chart, labs and discussed the  procedure including the risks, benefits and alternatives for the proposed anesthesia with the patient or authorized representative who has indicated his/her understanding and acceptance.     Dental advisory given  Plan Discussed with: CRNA and Surgeon  Anesthesia Plan Comments:        Anesthesia Quick Evaluation

## 2022-04-12 ENCOUNTER — Ambulatory Visit (INDEPENDENT_AMBULATORY_CARE_PROVIDER_SITE_OTHER): Payer: Medicare PPO | Admitting: Orthopedic Surgery

## 2022-04-12 ENCOUNTER — Encounter: Payer: Self-pay | Admitting: Orthopedic Surgery

## 2022-04-12 DIAGNOSIS — Z9889 Other specified postprocedural states: Secondary | ICD-10-CM | POA: Insufficient documentation

## 2022-04-12 NOTE — Patient Instructions (Addendum)
You can remove the sling for eating. Do not try to lift your hand above your head. Continue sponge bathing until you had your sutures removed next Friday 04/19/22  Physical therapy has been ordered for you at [facility]. They should call you to schedule, [phone] is the phone number to call, if you want to call to schedule.

## 2022-04-12 NOTE — Progress Notes (Signed)
Postop visit #1  Chief Complaint  Patient presents with   Routine Post Op    LT shoulder/ SHOULDER ARTHROSCOPY WITH DISTAL CLAVICLE EXCISION, LIMITED DEBRIDEMENT(labrum) (Left) DOS 04/09/22   Pain is very well-controlled just on ibuprofen  Wounds look clean dressing was changed  Therapy was ordered  Return next week for suture removal  Wear sling full-time except for meals  Patient can remove the sling during mealtimes is no lifting away from the body or above his head\\

## 2022-04-15 ENCOUNTER — Encounter (HOSPITAL_COMMUNITY): Payer: Self-pay | Admitting: Orthopedic Surgery

## 2022-04-16 DIAGNOSIS — H43811 Vitreous degeneration, right eye: Secondary | ICD-10-CM | POA: Diagnosis not present

## 2022-04-16 DIAGNOSIS — H2513 Age-related nuclear cataract, bilateral: Secondary | ICD-10-CM | POA: Diagnosis not present

## 2022-04-16 DIAGNOSIS — H11002 Unspecified pterygium of left eye: Secondary | ICD-10-CM | POA: Diagnosis not present

## 2022-04-17 ENCOUNTER — Ambulatory Visit (HOSPITAL_COMMUNITY): Payer: Medicare PPO | Admitting: Occupational Therapy

## 2022-04-19 ENCOUNTER — Ambulatory Visit (INDEPENDENT_AMBULATORY_CARE_PROVIDER_SITE_OTHER): Payer: Medicare PPO | Admitting: Orthopedic Surgery

## 2022-04-19 ENCOUNTER — Encounter: Payer: Self-pay | Admitting: Orthopedic Surgery

## 2022-04-19 DIAGNOSIS — S42295D Other nondisplaced fracture of upper end of left humerus, subsequent encounter for fracture with routine healing: Secondary | ICD-10-CM

## 2022-04-19 DIAGNOSIS — Z9889 Other specified postprocedural states: Secondary | ICD-10-CM

## 2022-04-19 DIAGNOSIS — G8929 Other chronic pain: Secondary | ICD-10-CM

## 2022-04-19 DIAGNOSIS — M25512 Pain in left shoulder: Secondary | ICD-10-CM

## 2022-04-19 NOTE — Progress Notes (Signed)
POST OP VISIT   Encounter Diagnoses  Name Primary?   Status post arthroscopy of left shoulder Yes   Other closed nondisplaced fracture of proximal end of left humerus with routine healing, subsequent encounter    Chronic left shoulder pain     Chief Complaint  Patient presents with   Routine Post Op    S/P SHOULDER SCOPE  DOS 04/09/22     Procedure    LT shoulder/ SHOULDER ARTHROSCOPY WITH DISTAL CLAVICLE EXCISION, LIMITED DEBRIDEMENT(labrum) (Left) DOS 04/09/22    POD  # 10  All sutures have been removed, the wounds are clean.  The patient is scheduled with physical therapy I will see him in 2 weeks.  He should wear his sling until the 19th and then he can use it as needed for comfort  He is to avoid all heavy lifting and I have given him the medical legal recommendations regarding driving

## 2022-04-25 ENCOUNTER — Ambulatory Visit (HOSPITAL_COMMUNITY): Payer: Medicare PPO | Attending: Orthopedic Surgery | Admitting: Occupational Therapy

## 2022-04-25 DIAGNOSIS — M25512 Pain in left shoulder: Secondary | ICD-10-CM | POA: Diagnosis not present

## 2022-04-25 DIAGNOSIS — M25612 Stiffness of left shoulder, not elsewhere classified: Secondary | ICD-10-CM | POA: Diagnosis not present

## 2022-04-25 DIAGNOSIS — R29898 Other symptoms and signs involving the musculoskeletal system: Secondary | ICD-10-CM | POA: Insufficient documentation

## 2022-04-25 NOTE — Therapy (Signed)
OUTPATIENT OCCUPATIONAL THERAPY ORTHO EVALUATION  Patient Name: Clifford Harris MRN: [id number] DOB:10-09-1955, 66 y.o., male Today's Date: 04/25/2022  PCP: Assunta Found, MD REFERRING PROVIDER: Romeo Apple  END OF SESSION:  OT End of Session - 04/30/22 1422     Visit Number 1    Number of Visits 7    Date for OT Re-Evaluation 06/14/22    Authorization Type [employer]    OT Start Time 1345    OT Stop Time 1425    OT Time Calculation (min) 40 min    Activity Tolerance Patient tolerated treatment well    Behavior During Therapy WFL for tasks assessed/performed              Past Medical History:  Diagnosis Date   Arthritis    Depression    Neuropathy    Past Surgical History:  Procedure Laterality Date   COLONOSCOPY N/A 02/03/2019   Procedure: COLONOSCOPY;  Surgeon: Malissa Hippo, MD;  [facility]: [facility];  Service: Endoscopy;  Laterality: N/A;  730    SHOULDER ARTHROSCOPY WITH DISTAL CLAVICLE RESECTION Left 04/09/2022   Procedure: SHOULDER ARTHROSCOPY WITH DISTAL CLAVICLE EXCISION, LIMITED DEBRIDEMENT;  Surgeon: Vickki Hearing, MD;  Location: [facility];  Service: Orthopedics;  Laterality: Left;   TONSILLECTOMY AND ADENOIDECTOMY     WISDOM TOOTH EXTRACTION     Patient Active Problem List   Diagnosis Date Noted   Status post arthroscopy of left shoulder 04/12/2022   Closed fracture of proximal end of left humerus with routine healing 07/27/2020   Special screening for malignant neoplasms, colon 06/10/2018   DDD (degenerative disc disease), cervical 04/06/2015   Tinea pedis of left foot 01/04/2014   Osteoarthritis of foot, left 01/04/2014   Stiffness of joint, lower leg 06/29/2013   Strain of hamstring muscle 06/29/2013   Right hip pain 06/22/2013   Sciatic nerve pain 04/11/2011   Gait abnormality 09/26/2010   STRESS FRACTURE, FOOT 04/24/2009   MONONEURITIS, LEG 03/27/2009    ONSET DATE: 04/09/22  REFERRING DIAG: L RCR  THERAPY DIAG:  No diagnosis  found.  Rationale for Evaluation and Treatment: Rehabilitation  SUBJECTIVE:   SUBJECTIVE STATEMENT: "I just want to get back to running again." Pt accompanied by: self  PERTINENT HISTORY: Pt fractured his L humerus last year and has been having pain and difficulty with ROM ever since. Had an MRI in the fall of 2023 found to have a rotator cuff tear. He is s/p RCR since 04/09/22. Pt is an avid camper, runner, and exerciser.   PRECAUTIONS: Shoulder  WEIGHT BEARING RESTRICTIONS: Yes 5lbs  PAIN:  Are you having pain? No  FALLS: Has patient fallen in last 6 months? No  LIVING ENVIRONMENT: Lives with: lives with their spouse Lives in: House/apartment  PLOF: Independent  PATIENT GOALS: To get back to frozen shoulder  NEXT MD VISIT: 05/03/22  OBJECTIVE:   HAND DOMINANCE: Right  ADLs: Overall ADLs: Pt has difficulty with all IADL's and reaching behind his back and overhead for dressing and bathing.   FUNCTIONAL OUTCOME MEASURES: FOTO: 58.53  UPPER EXTREMITY ROM:     Active ROM Left eval  Shoulder flexion 135  Shoulder abduction 157  Shoulder internal rotation 90  Shoulder external rotation 45  (Blank rows = not tested)  UPPER EXTREMITY MMT:     MMT Left eval  Shoulder flexion 4/5  Shoulder abduction 4+/5  Shoulder adduction 4/5  Shoulder extension 4-/5  Shoulder internal rotation 4+/5  Shoulder external rotation 4/5  (  Blank rows = not tested)  HAND FUNCTION: Grip strength: Right: 64 lbs; Left: 62 lbs  SENSATION: WFL  EDEMA: No swelling noted  OBSERVATIONS: moderate fascial restrictions along the biceps, subscapularis, and trapezius   TODAY'S TREATMENT:                                                                                                                              DATE:  04/25/22: Eval only     PATIENT EDUCATION: Education details: Daleen Squibb Slides Person educated: Patient Education method: Explanation and Demonstration Education  comprehension: verbalized understanding and returned demonstration  HOME EXERCISE PROGRAM: 12/21: Wall Slides  GOALS: Goals reviewed with patient? Yes  SHORT TERM GOALS: Target date: 05/14/22  Pt will be provided with and educated on HEP to improve mobility in LUE for ADL completion. Goal status: INITIAL  2.  Pt will decrease LUE fascial restrictions to minimal amounts or less to improve mobility required for overhead reaching tasks.  Goal status: INITIAL  3.  Pt will decrease pain in LUE to 2/10 or less in order to sleep for 4+ consecutive hours without waking due to pain.  Goal status: INITIAL  4.  Pt will increase A/ROM of LUE to [facility] to improve ability to complete overhead and behind the back reaching during dressing and bathing tasks.  Goal status: INITIAL  5.  Pt will increase strength of LUE to 4+/5 to improve ability to perform lifting tasks required during cooking and cleaning activities.  Goal status: INITIAL   ASSESSMENT:  CLINICAL IMPRESSION: Patient is a 66 y.o. male who was seen today for occupational therapy evaluation for s/p L rotator cuff repair. He presents with mild pain and moderate fascial restrictions, which are limiting his ability to complete ADL's and IADL's. 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 Per patient, his wife has to assist with most ADL's and all IADL's at this time. He will benefit from skilled OT to address his ROM, strength, fascial restrictions, and pain, in order to maximize his independence.   PERFORMANCE DEFICITS: in functional skills including ADLs, IADLs, ROM, strength, pain, fascial restrictions, Gross motor control, body mechanics, and UE functional use.  IMPAIRMENTS: are limiting patient from ADLs, IADLs, rest and sleep, leisure, and social participation.   COMORBIDITIES: has no other co-morbidities that affects  occupational performance. Patient will benefit from skilled OT to address above impairments and improve overall function.  MODIFICATION OR ASSISTANCE TO COMPLETE EVALUATION: No modification of tasks or assist necessary to complete an evaluation.  OT OCCUPATIONAL PROFILE AND HISTORY: Problem focused assessment: Including review of records relating to presenting problem.  CLINICAL DECISION MAKING: LOW - limited treatment options, no task modification necessary  REHAB POTENTIAL: Excellent  EVALUATION COMPLEXITY: Low      PLAN:  OT FREQUENCY: 1x/week  OT DURATION: 6  weeks  PLANNED INTERVENTIONS: self care/ADL training, therapeutic exercise, therapeutic activity, manual therapy, passive range of motion, functional mobility training, ultrasound, moist heat, cryotherapy, patient/family education, and coping strategies training  RECOMMENDED OTHER SERVICES: N/A  CONSULTED AND AGREED WITH PLAN OF CARE: Patient  PLAN FOR NEXT SESSION: AA/ROM, A/ROM, wall slides, isometrics  Nalina Yeatman Bing Plume, OTR/L [facility] [phone] Daniya Aramburo Rosemarie Beath, OT 04/25/2022, 1:58 PM

## 2022-04-30 ENCOUNTER — Encounter (HOSPITAL_COMMUNITY): Payer: Medicare PPO | Admitting: Occupational Therapy

## 2022-05-02 ENCOUNTER — Ambulatory Visit (INDEPENDENT_AMBULATORY_CARE_PROVIDER_SITE_OTHER): Payer: Medicare PPO | Admitting: Orthopedic Surgery

## 2022-05-02 ENCOUNTER — Encounter: Payer: Self-pay | Admitting: Orthopedic Surgery

## 2022-05-02 DIAGNOSIS — Z9889 Other specified postprocedural states: Secondary | ICD-10-CM

## 2022-05-02 NOTE — Progress Notes (Signed)
Chief Complaint  Patient presents with   Routine Post Op    S/P LT shoulder scope DOS 04/09/22   POD #23   PROCEDURE:  Procedure(s): SHOULDER ARTHROSCOPY WITH DISTAL CLAVICLE EXCISION, LIMITED DEBRIDEMENT(labrum) (Left)  Clifford Harris continues to proved.  He is moving his arm very well.  He does have some soreness in the anterior deltoid  His incision looks good  He will go to therapy once a week for checkups and do most of his therapy at home and follow-up with me in 4 weeks

## 2022-05-08 ENCOUNTER — Encounter (HOSPITAL_COMMUNITY): Payer: Self-pay | Admitting: Occupational Therapy

## 2022-05-08 ENCOUNTER — Ambulatory Visit (HOSPITAL_COMMUNITY): Payer: Medicare PPO | Attending: Orthopedic Surgery | Admitting: Occupational Therapy

## 2022-05-08 DIAGNOSIS — R29898 Other symptoms and signs involving the musculoskeletal system: Secondary | ICD-10-CM | POA: Insufficient documentation

## 2022-05-08 DIAGNOSIS — M25612 Stiffness of left shoulder, not elsewhere classified: Secondary | ICD-10-CM | POA: Insufficient documentation

## 2022-05-08 DIAGNOSIS — M25512 Pain in left shoulder: Secondary | ICD-10-CM | POA: Diagnosis not present

## 2022-05-08 NOTE — Therapy (Signed)
OUTPATIENT OCCUPATIONAL THERAPY ORTHO EVALUATION  Patient Name: Clifford Harris MRN: [id number] DOB:1955-12-21, 67 y.o., male Today's Date: 05/08/2022  PCP: Sharilyn Sites, MD REFERRING PROVIDER: Aline Brochure   OT End of Session - 05/08/22 1125     Visit Number 2    Number of Visits 7    Date for OT Re-Evaluation 06/14/22    Authorization Type [employer]    OT Start Time 1120    OT Stop Time 1200    OT Time Calculation (min) 40 min    Activity Tolerance Patient tolerated treatment well    Behavior During Therapy WFL for tasks assessed/performed             Past Medical History:  Diagnosis Date   Arthritis    Depression    Neuropathy    Past Surgical History:  Procedure Laterality Date   COLONOSCOPY N/A 02/03/2019   Procedure: COLONOSCOPY;  Surgeon: Rogene Houston, MD;  [facility];  Service: Endoscopy;  Laterality: N/A;  730    SHOULDER ARTHROSCOPY WITH DISTAL CLAVICLE RESECTION Left 04/09/2022   Procedure: SHOULDER ARTHROSCOPY WITH DISTAL CLAVICLE EXCISION, LIMITED DEBRIDEMENT;  Surgeon: Carole Civil, MD;  Location: [facility];  Service: Orthopedics;  Laterality: Left;   TONSILLECTOMY AND ADENOIDECTOMY     WISDOM TOOTH EXTRACTION     Patient Active Problem List   Diagnosis Date Noted   S/P shoulder surgery 04/12/2022   Closed fracture of proximal end of left humerus with routine healing 07/27/2020   Special screening for malignant neoplasms, colon 06/10/2018   DDD (degenerative disc disease), cervical 04/06/2015   Tinea pedis of left foot 01/04/2014   Osteoarthritis of foot, left 01/04/2014   Stiffness of joint, lower leg 06/29/2013   Strain of hamstring muscle 06/29/2013   Right hip pain 06/22/2013   Sciatic nerve pain 04/11/2011   Gait abnormality 09/26/2010   STRESS FRACTURE, FOOT 04/24/2009   MONONEURITIS, LEG 03/27/2009    ONSET DATE: 04/09/22  REFERRING DIAG: L RCR  THERAPY DIAG:  Acute pain of left shoulder  Stiffness of left  shoulder, not elsewhere classified  Other symptoms and signs involving the musculoskeletal system  Rationale for Evaluation and Treatment: Rehabilitation  SUBJECTIVE:   SUBJECTIVE STATEMENT: "I have been trying to use my arm more and its working better"  PERTINENT HISTORY: Pt fractured his L humerus last year and has been having pain and difficulty with ROM ever since. Had an MRI in the fall of 2023 found to have a rotator cuff tear. He is s/p RCR since 04/09/22. Pt is an avid camper, runner, and exerciser.   PRECAUTIONS: Shoulder  WEIGHT BEARING RESTRICTIONS: Yes 5lbs  PAIN:  Are you having pain? No  FALLS: Has patient fallen in last 6 months? No  LIVING ENVIRONMENT: Lives with: lives with their spouse Lives in: House/apartment  PLOF: Independent  PATIENT GOALS: To get back to frozen shoulder  NEXT MD VISIT: 05/03/22  OBJECTIVE:   HAND DOMINANCE: Right  ADLs: Overall ADLs: Pt has difficulty with all IADL's and reaching behind his back and overhead for dressing and bathing.   FUNCTIONAL OUTCOME MEASURES: FOTO: 58.53  UPPER EXTREMITY ROM:     Active ROM Left eval  Shoulder flexion 135  Shoulder abduction 157  Shoulder internal rotation 90  Shoulder external rotation 45  (Blank rows = not tested)  UPPER EXTREMITY MMT:     MMT Left eval  Shoulder flexion 4/5  Shoulder abduction 4+/5  Shoulder adduction 4/5  Shoulder  extension 4-/5  Shoulder internal rotation 4+/5  Shoulder external rotation 4/5  (Blank rows = not tested)  HAND FUNCTION: Grip strength: Right: 64 lbs; Left: 62 lbs  SENSATION: WFL  EDEMA: No swelling noted  OBSERVATIONS: moderate fascial restrictions along the biceps, subscapularis, and trapezius   TODAY'S TREATMENT:                                                                                                                              DATE:  05/08/22 -Manual Therapy: myofascial release and trigger point applied to biceps,  trapezius, and subscapularis, in order to decrease fascial restrictions and decrease pain and improve ROM. -P/ROM: supine, flexion, abduction, horizontal abduction, er/IR, x10 -AA/ROM: supine, flexion, abduction, protraction, horizontal abduction, er/IR, x10 -A/ROM: supine, flexion, abduction, protraction, horizontal abduction, er/IR, x8 -Pulleys: flexion and abduction x60" each   PATIENT EDUCATION: Education details: AA/ROM Person educated: Patient Education method: Customer service manager Education comprehension: verbalized understanding and returned demonstration  HOME EXERCISE PROGRAM: 12/21: Wall Slides 1/5: AA/ROM  GOALS: Goals reviewed with patient? Yes  SHORT TERM GOALS: Target date: 05/14/22  Pt will be provided with and educated on HEP to improve mobility in LUE for ADL completion. Goal status: IN PROGRESS  2.  Pt will decrease LUE fascial restrictions to minimal amounts or less to improve mobility required for overhead reaching tasks.  Goal status: IN PROGRESS  3.  Pt will decrease pain in LUE to 2/10 or less in order to sleep for 4+ consecutive hours without waking due to pain.  Goal status: IN PROGRESS  4.  Pt will increase A/ROM of LUE to [facility] to improve ability to complete overhead and behind the back reaching during dressing and bathing tasks.  Goal status: IN PROGRESS  5.  Pt will increase strength of LUE to 4+/5 to improve ability to perform lifting tasks required during cooking and cleaning activities.  Goal status: IN PROGRESS   ASSESSMENT:  CLINICAL IMPRESSION: Pt presents to this session with continued pain with ROM, however he reports he feels like he is able to tolerate it more as he reaches higher. This session he did not require any rest breaks during all ROM activities, however his ROM and pace of tasks declined further into the session. OT providing verbal cuing for positioning and technique throughout all tasks.     PLAN:  OT FREQUENCY:  1x/week  OT DURATION: 6 weeks  PLANNED INTERVENTIONS: self care/ADL training, therapeutic exercise, therapeutic activity, manual therapy, passive range of motion, functional mobility training, ultrasound, moist heat, cryotherapy, patient/family education, and coping strategies training  RECOMMENDED OTHER SERVICES: N/A  CONSULTED AND AGREED WITH PLAN OF CARE: Patient  PLAN FOR NEXT SESSION: AA/ROM, A/ROM, wall slides, isometrics, stretching   Dallie Dad [facility] [place], [place] 05/08/2022, 11:26 AM

## 2022-05-08 NOTE — Patient Instructions (Signed)

## 2022-05-17 ENCOUNTER — Encounter (HOSPITAL_COMMUNITY): Payer: Self-pay | Admitting: Occupational Therapy

## 2022-05-17 ENCOUNTER — Ambulatory Visit (HOSPITAL_COMMUNITY): Payer: Medicare PPO | Admitting: Occupational Therapy

## 2022-05-17 DIAGNOSIS — M25512 Pain in left shoulder: Secondary | ICD-10-CM

## 2022-05-17 DIAGNOSIS — M25612 Stiffness of left shoulder, not elsewhere classified: Secondary | ICD-10-CM | POA: Diagnosis not present

## 2022-05-17 DIAGNOSIS — R29898 Other symptoms and signs involving the musculoskeletal system: Secondary | ICD-10-CM

## 2022-05-17 NOTE — Therapy (Signed)
OUTPATIENT OCCUPATIONAL THERAPY ORTHO TREATMENT  Patient Name: Clifford Harris MRN: [id number] DOB:Dec 27, 1955, 67 y.o., male Today's Date: 05/17/2022  PCP: Sharilyn Sites, MD REFERRING PROVIDER: Aline Brochure   OT End of Session - 05/17/22 1114     Visit Number 3    Number of Visits 7    Date for OT Re-Evaluation 06/14/22    Authorization Type [employer]    OT Start Time 1030    OT Stop Time 1113    OT Time Calculation (min) 43 min    Activity Tolerance Patient tolerated treatment well    Behavior During Therapy WFL for tasks assessed/performed              Past Medical History:  Diagnosis Date   Arthritis    Depression    Neuropathy    Past Surgical History:  Procedure Laterality Date   COLONOSCOPY N/A 02/03/2019   Procedure: COLONOSCOPY;  Surgeon: Rogene Houston, MD;  [facility]: [facility];  Service: Endoscopy;  Laterality: N/A;  730    SHOULDER ARTHROSCOPY WITH DISTAL CLAVICLE RESECTION Left 04/09/2022   Procedure: SHOULDER ARTHROSCOPY WITH DISTAL CLAVICLE EXCISION, LIMITED DEBRIDEMENT;  Surgeon: Carole Civil, MD;  Location: [facility];  Service: Orthopedics;  Laterality: Left;   TONSILLECTOMY AND ADENOIDECTOMY     WISDOM TOOTH EXTRACTION     Patient Active Problem List   Diagnosis Date Noted   S/P shoulder surgery 04/12/2022   Closed fracture of proximal end of left humerus with routine healing 07/27/2020   Special screening for malignant neoplasms, colon 06/10/2018   DDD (degenerative disc disease), cervical 04/06/2015   Tinea pedis of left foot 01/04/2014   Osteoarthritis of foot, left 01/04/2014   Stiffness of joint, lower leg 06/29/2013   Strain of hamstring muscle 06/29/2013   Right hip pain 06/22/2013   Sciatic nerve pain 04/11/2011   Gait abnormality 09/26/2010   STRESS FRACTURE, FOOT 04/24/2009   MONONEURITIS, LEG 03/27/2009    ONSET DATE: 04/09/22  REFERRING DIAG: L RCR  THERAPY DIAG:  Acute pain of left shoulder  Stiffness of left  shoulder, not elsewhere classified  Other symptoms and signs involving the musculoskeletal system  Rationale for Evaluation and Treatment: Rehabilitation  SUBJECTIVE:   SUBJECTIVE STATEMENT: "I do the fake jumping jacks and I do the reaching out."  PERTINENT HISTORY: Pt fractured his L humerus last year and has been having pain and difficulty with ROM ever since. Had an MRI in the fall of 2023 found to have a rotator cuff tear. He is s/p RCR since 04/09/22. Pt is an avid camper, runner, and exerciser.   PRECAUTIONS: Shoulder  WEIGHT BEARING RESTRICTIONS: Yes 5lbs  PAIN:  Are you having pain? No  FALLS: Has patient fallen in last 6 months? No  LIVING ENVIRONMENT: Lives with: lives with their spouse Lives in: House/apartment  PLOF: Independent  PATIENT GOALS: To get back to frozen shoulder  NEXT MD VISIT: 05/03/22  OBJECTIVE:   HAND DOMINANCE: Right  ADLs: Overall ADLs: Pt has difficulty with all IADL's and reaching behind his back and overhead for dressing and bathing.   FUNCTIONAL OUTCOME MEASURES: FOTO: 58.53  UPPER EXTREMITY ROM:     Active ROM Left eval  Shoulder flexion 135  Shoulder abduction 157  Shoulder internal rotation 90  Shoulder external rotation 45  (Blank rows = not tested)  UPPER EXTREMITY MMT:     MMT Left eval  Shoulder flexion 4/5  Shoulder abduction 4+/5  Shoulder adduction 4/5  Shoulder  extension 4-/5  Shoulder internal rotation 4+/5  Shoulder external rotation 4/5  (Blank rows = not tested)  HAND FUNCTION: Grip strength: Right: 64 lbs; Left: 62 lbs  SENSATION: WFL  EDEMA: No swelling noted  OBSERVATIONS: moderate fascial restrictions along the biceps, subscapularis, and trapezius   TODAY'S TREATMENT:                                                                                                                              DATE:  05/17/22 -Manual Therapy: myofascial release and trigger point applied to biceps,  trapezius, and subscapularis, in order to decrease fascial restrictions and decrease pain and improve ROM. -P/ROM: supine, flexion, abduction, horizontal abduction, er/IR, x10 -AA/ROM: supine-flexion, abduction, protraction, horizontal abduction, er/IR, 2# dowel rod, x15 -Proximal shoulder strengthening: supine-paddles, criss cross, circles each direction, x15 each -AA/ROM: standing-flexion, abduction, protraction, horizontal abduction, er/IR, 2# dowel rod, x15 -Proximal shoulder strengthening on doorway, arm at 90 degrees flexion, 1'   05/08/22 -Manual Therapy: myofascial release and trigger point applied to biceps, trapezius, and subscapularis, in order to decrease fascial restrictions and decrease pain and improve ROM. -P/ROM: supine, flexion, abduction, horizontal abduction, er/IR, x10 -AA/ROM: supine, flexion, abduction, protraction, horizontal abduction, er/IR, x10 -A/ROM: supine, flexion, abduction, protraction, horizontal abduction, er/IR, x8 -Pulleys: flexion and abduction x60" each   PATIENT EDUCATION: Education details: reviewed HEP Person educated: Patient Education method: Customer service manager Education comprehension: verbalized understanding and returned demonstration  HOME EXERCISE PROGRAM: 12/21: Wall Slides 1/5: AA/ROM  GOALS: Goals reviewed with patient? Yes  SHORT TERM GOALS: Target date: 05/14/22  Pt will be provided with and educated on HEP to improve mobility in LUE for ADL completion. Goal status: IN PROGRESS  2.  Pt will decrease LUE fascial restrictions to minimal amounts or less to improve mobility required for overhead reaching tasks.  Goal status: IN PROGRESS  3.  Pt will decrease pain in LUE to 2/10 or less in order to sleep for 4+ consecutive hours without waking due to pain.  Goal status: IN PROGRESS  4.  Pt will increase A/ROM of LUE to [facility] to improve ability to complete overhead and behind the back reaching during dressing and bathing  tasks.  Goal status: IN PROGRESS  5.  Pt will increase strength of LUE to 4+/5 to improve ability to perform lifting tasks required during cooking and cleaning activities.  Goal status: IN PROGRESS   ASSESSMENT:  CLINICAL IMPRESSION: Pt reports he is completing strengthening with 2.5# weight at home, cautioned pt with not progressing too fast and following protocol. Continued with manual techniques this session, focusing on medial deltoid and trapezius regions. Completed AA/ROM with 2# dowel, pt holding end range positions for ~3 seconds, increased reps to 15. Added proximal shoulder strengthening in supine, in standing on doorway. Pt with ROM WNL throughout all planes. Verbal cuing for form and technique.    PLAN:  OT FREQUENCY: 1x/week  OT DURATION: 6 weeks  PLANNED INTERVENTIONS: self care/ADL training, therapeutic exercise, therapeutic activity, manual therapy, passive range of motion, functional mobility training, ultrasound, moist heat, cryotherapy, patient/family education, and coping strategies training  CONSULTED AND AGREED WITH PLAN OF CARE: Patient  PLAN FOR NEXT SESSION: AA/ROM, A/ROM, wall slides, isometrics, stretching   Ezra Sites, OTR/L  [phone] 05/17/2022, 11:15 AM

## 2022-05-21 DIAGNOSIS — H04123 Dry eye syndrome of bilateral lacrimal glands: Secondary | ICD-10-CM | POA: Diagnosis not present

## 2022-05-24 ENCOUNTER — Ambulatory Visit (HOSPITAL_COMMUNITY): Payer: Medicare PPO | Admitting: Occupational Therapy

## 2022-05-24 ENCOUNTER — Encounter (HOSPITAL_COMMUNITY): Payer: Self-pay | Admitting: Occupational Therapy

## 2022-05-24 DIAGNOSIS — R29898 Other symptoms and signs involving the musculoskeletal system: Secondary | ICD-10-CM

## 2022-05-24 DIAGNOSIS — M25612 Stiffness of left shoulder, not elsewhere classified: Secondary | ICD-10-CM | POA: Diagnosis not present

## 2022-05-24 DIAGNOSIS — M25512 Pain in left shoulder: Secondary | ICD-10-CM | POA: Diagnosis not present

## 2022-05-24 NOTE — Patient Instructions (Signed)

## 2022-05-24 NOTE — Therapy (Signed)
OUTPATIENT OCCUPATIONAL THERAPY ORTHO TREATMENT  Patient Name: Clifford Harris MRN: [id number] DOB:1956-01-21, 67 y.o., male Today's Date: 05/24/2022  PCP: Sharilyn Sites, MD REFERRING PROVIDER: Aline Brochure   OT End of Session - 05/24/22 1156     Visit Number 4    Number of Visits 7    Date for OT Re-Evaluation 06/14/22    Authorization Type [employer]    OT Start Time 1120    OT Stop Time 1201    OT Time Calculation (min) 41 min    Activity Tolerance Patient tolerated treatment well    Behavior During Therapy WFL for tasks assessed/performed               Past Medical History:  Diagnosis Date   Arthritis    Depression    Neuropathy    Past Surgical History:  Procedure Laterality Date   COLONOSCOPY N/A 02/03/2019   Procedure: COLONOSCOPY;  Surgeon: Rogene Houston, MD;  [facility]: [facility];  Service: Endoscopy;  Laterality: N/A;  730    SHOULDER ARTHROSCOPY WITH DISTAL CLAVICLE RESECTION Left 04/09/2022   Procedure: SHOULDER ARTHROSCOPY WITH DISTAL CLAVICLE EXCISION, LIMITED DEBRIDEMENT;  Surgeon: Carole Civil, MD;  Location: [facility];  Service: Orthopedics;  Laterality: Left;   TONSILLECTOMY AND ADENOIDECTOMY     WISDOM TOOTH EXTRACTION     Patient Active Problem List   Diagnosis Date Noted   S/P shoulder surgery 04/12/2022   Closed fracture of proximal end of left humerus with routine healing 07/27/2020   Special screening for malignant neoplasms, colon 06/10/2018   DDD (degenerative disc disease), cervical 04/06/2015   Tinea pedis of left foot 01/04/2014   Osteoarthritis of foot, left 01/04/2014   Stiffness of joint, lower leg 06/29/2013   Strain of hamstring muscle 06/29/2013   Right hip pain 06/22/2013   Sciatic nerve pain 04/11/2011   Gait abnormality 09/26/2010   STRESS FRACTURE, FOOT 04/24/2009   MONONEURITIS, LEG 03/27/2009    ONSET DATE: 04/09/22  REFERRING DIAG: L RCR  THERAPY DIAG:  Acute pain of left shoulder  Stiffness of left  shoulder, not elsewhere classified  Other symptoms and signs involving the musculoskeletal system  Rationale for Evaluation and Treatment: Rehabilitation  SUBJECTIVE:   SUBJECTIVE STATEMENT: "I'm doing the jumping jacks and the wall 2-3x/day."  PERTINENT HISTORY: Pt fractured his L humerus last year and has been having pain and difficulty with ROM ever since. Had an MRI in the fall of 2023 found to have a rotator cuff tear. He is s/p RCR since 04/09/22. Pt is an avid camper, runner, and exerciser.   PRECAUTIONS: Shoulder  WEIGHT BEARING RESTRICTIONS: Yes 5lbs  PAIN:  Are you having pain? No  FALLS: Has patient fallen in last 6 months? No  LIVING ENVIRONMENT: Lives with: lives with their spouse Lives in: House/apartment  PLOF: Independent  PATIENT GOALS: To get back to frozen shoulder  NEXT MD VISIT: 05/30/22  OBJECTIVE:   HAND DOMINANCE: Right  ADLs: Overall ADLs: Pt has difficulty with all IADL's and reaching behind his back and overhead for dressing and bathing.   FUNCTIONAL OUTCOME MEASURES: FOTO: 58.53  UPPER EXTREMITY ROM:     Active ROM Left eval  Shoulder flexion 135  Shoulder abduction 157  Shoulder internal rotation 90  Shoulder external rotation 45  (Blank rows = not tested)  UPPER EXTREMITY MMT:     MMT Left eval  Shoulder flexion 4/5  Shoulder abduction 4+/5  Shoulder adduction 4/5  Shoulder extension 4-/5  Shoulder internal rotation 4+/5  Shoulder external rotation 4/5  (Blank rows = not tested)  OBSERVATIONS: moderate fascial restrictions along the biceps, subscapularis, and trapezius   TODAY'S TREATMENT:                                                                                                                              DATE:  05/25/23 -Manual Therapy: myofascial release and trigger point applied to biceps, trapezius, and subscapularis, in order to decrease fascial restrictions and decrease pain and improve ROM. -P/ROM: supine,  flexion, abduction, horizontal abduction, er/IR, x10 -A/ROM: supine-flexion, abduction, protraction, horizontal abduction, er/IR, x15 -Proximal shoulder strengthening: supine-paddles, criss cross, circles each direction, x15 each -A/ROM: standing-flexion, abduction, protraction, horizontal abduction, er/IR, x10 -Scapular theraband: red-row, extension, retraction, 10 reps -x to v arms: 10 reps -UBE: level 2, 3' forward 3' reverse, pace: 7.5  05/17/22 -Manual Therapy: myofascial release and trigger point applied to biceps, trapezius, and subscapularis, in order to decrease fascial restrictions and decrease pain and improve ROM. -P/ROM: supine, flexion, abduction, horizontal abduction, er/IR, x10 -AA/ROM: supine-flexion, abduction, protraction, horizontal abduction, er/IR, 2# dowel rod, x15 -Proximal shoulder strengthening: supine-paddles, criss cross, circles each direction, x15 each -AA/ROM: standing-flexion, abduction, protraction, horizontal abduction, er/IR, 2# dowel rod, x15 -Proximal shoulder strengthening on doorway, arm at 90 degrees flexion, 1'   05/08/22 -Manual Therapy: myofascial release and trigger point applied to biceps, trapezius, and subscapularis, in order to decrease fascial restrictions and decrease pain and improve ROM. -P/ROM: supine, flexion, abduction, horizontal abduction, er/IR, x10 -AA/ROM: supine, flexion, abduction, protraction, horizontal abduction, er/IR, x10 -A/ROM: supine, flexion, abduction, protraction, horizontal abduction, er/IR, x8 -Pulleys: flexion and abduction x60" each   PATIENT EDUCATION: Education details: A/ROM Person educated: Patient Education method: Medical illustrator Education comprehension: verbalized understanding and returned demonstration  HOME EXERCISE PROGRAM: 12/21: Wall Slides 1/5: AA/ROM 05/24/22: A/ROM  GOALS: Goals reviewed with patient? Yes  SHORT TERM GOALS: Target date: 05/14/22  Pt will be provided with and  educated on HEP to improve mobility in LUE for ADL completion. Goal status: IN PROGRESS  2.  Pt will decrease LUE fascial restrictions to minimal amounts or less to improve mobility required for overhead reaching tasks.  Goal status: IN PROGRESS  3.  Pt will decrease pain in LUE to 2/10 or less in order to sleep for 4+ consecutive hours without waking due to pain.  Goal status: IN PROGRESS  4.  Pt will increase A/ROM of LUE to [facility] to improve ability to complete overhead and behind the back reaching during dressing and bathing tasks.  Goal status: IN PROGRESS  5.  Pt will increase strength of LUE to 4+/5 to improve ability to perform lifting tasks required during cooking and cleaning activities.  Goal status: IN PROGRESS   ASSESSMENT:  CLINICAL IMPRESSION: Pt reports HEP is going well. Continued with myofascial release today, pt with moderate sized muscle knot in upper trapezius region, good response to trigger  point release. Progressed to A/ROM today in supine and standing, holding end range for approximately 3-4 seconds for each rep. Added scapular theraband, x to v arms, and UBE, verbal cuing for form and technique during exercises. Pt with tendency to flex elbow to move hand further back, cuing for focusing on maintaining extension and stretching the shoulder.    PLAN:  OT FREQUENCY: 1x/week  OT DURATION: 6 weeks  PLANNED INTERVENTIONS: self care/ADL training, therapeutic exercise, therapeutic activity, manual therapy, passive range of motion, functional mobility training, ultrasound, moist heat, cryotherapy, patient/family education, and coping strategies training  CONSULTED AND AGREED WITH PLAN OF CARE: Patient  PLAN FOR NEXT SESSION: A/ROM, proximal shoulder strengthening, add gentle shoulder stretches and overhead lacing, update HEP for scapular theraband, add ball on wall   Guadelupe Sabin, OTR/L  [phone] 05/24/2022, 12:02 PM

## 2022-05-30 ENCOUNTER — Ambulatory Visit (INDEPENDENT_AMBULATORY_CARE_PROVIDER_SITE_OTHER): Payer: Medicare PPO | Admitting: Orthopedic Surgery

## 2022-05-30 ENCOUNTER — Encounter: Payer: Self-pay | Admitting: Orthopedic Surgery

## 2022-05-30 DIAGNOSIS — Z9889 Other specified postprocedural states: Secondary | ICD-10-CM

## 2022-05-30 NOTE — Progress Notes (Signed)
Chief Complaint  Patient presents with   Post-op Follow-up    Left shoulder 04/09/22    Encounter Diagnosis  Name Primary?   S/P shoulder surgery left/ 04/09/22 left distal clavicle excision/ debridement Yes   1 DAYS POST OP  Clifford Harris continues to do well he still has some soreness over his shoulder with the pain over the [facility] joint has resolved  He exhibits full range of motion his therapy notes indicate excellent progression  Recommend he continue to advance his exercises he can use 5 pound weights and run for exercise which he is already started we will see him in 3 months

## 2022-05-31 ENCOUNTER — Ambulatory Visit (HOSPITAL_COMMUNITY): Payer: Medicare PPO | Admitting: Occupational Therapy

## 2022-05-31 ENCOUNTER — Encounter (HOSPITAL_COMMUNITY): Payer: Self-pay | Admitting: Occupational Therapy

## 2022-05-31 DIAGNOSIS — M25612 Stiffness of left shoulder, not elsewhere classified: Secondary | ICD-10-CM | POA: Diagnosis not present

## 2022-05-31 DIAGNOSIS — R29898 Other symptoms and signs involving the musculoskeletal system: Secondary | ICD-10-CM | POA: Diagnosis not present

## 2022-05-31 DIAGNOSIS — M25512 Pain in left shoulder: Secondary | ICD-10-CM | POA: Diagnosis not present

## 2022-05-31 NOTE — Patient Instructions (Signed)

## 2022-05-31 NOTE — Therapy (Signed)
[place] REASSESSMENT  Patient Name: Clifford Harris MRN: [id number] DOB:04-19-1956, 67 y.o., male Today's Date: 05/31/2022  PCP: Sharilyn Sites, MD REFERRING PROVIDER: Aline Brochure   OT End of Session - 05/31/22 1436     Visit Number 5    Number of Visits 7    Date for OT Re-Evaluation 06/14/22    Authorization Type [employer]    OT Start Time 6160    OT Stop Time 1430    OT Time Calculation (min) 45 min    Activity Tolerance Patient tolerated treatment well    Behavior During Therapy WFL for tasks assessed/performed                Past Medical History:  Diagnosis Date   Arthritis    Depression    Neuropathy    Past Surgical History:  Procedure Laterality Date   COLONOSCOPY N/A 02/03/2019   Procedure: COLONOSCOPY;  Surgeon: Rogene Houston, MD;  [facility];  Service: Endoscopy;  Laterality: N/A;  730    SHOULDER ARTHROSCOPY WITH DISTAL CLAVICLE RESECTION Left 04/09/2022   Procedure: SHOULDER ARTHROSCOPY WITH DISTAL CLAVICLE EXCISION, LIMITED DEBRIDEMENT;  Surgeon: Carole Civil, MD;  Location: [facility];  Service: Orthopedics;  Laterality: Left;   TONSILLECTOMY AND ADENOIDECTOMY     WISDOM TOOTH EXTRACTION     Patient Active Problem List   Diagnosis Date Noted   S/P shoulder surgery 04/12/2022   Closed fracture of proximal end of left humerus with routine healing 07/27/2020   Special screening for malignant neoplasms, colon 06/10/2018   DDD (degenerative disc disease), cervical 04/06/2015   Tinea pedis of left foot 01/04/2014   Osteoarthritis of foot, left 01/04/2014   Stiffness of joint, lower leg 06/29/2013   Strain of hamstring muscle 06/29/2013   Right hip pain 06/22/2013   Sciatic nerve pain 04/11/2011   Gait abnormality 09/26/2010   STRESS FRACTURE, FOOT 04/24/2009   MONONEURITIS, LEG 03/27/2009    ONSET DATE: 04/09/22  REFERRING DIAG: L RCR  THERAPY DIAG:  Acute pain of left shoulder  Stiffness of  left shoulder, not elsewhere classified  Other symptoms and signs involving the musculoskeletal system  Left shoulder pain, unspecified chronicity  Rationale for Evaluation and Treatment: Rehabilitation  SUBJECTIVE:   SUBJECTIVE STATEMENT: "I have been lifting 5 pounds."  PERTINENT HISTORY: Pt fractured his L humerus last year and has been having pain and difficulty with ROM ever since. Had an MRI in the fall of 2023 found to have a rotator cuff tear. He is s/p RCR since 04/09/22. Pt is an avid camper, runner, and exerciser.   PRECAUTIONS: Shoulder  WEIGHT BEARING RESTRICTIONS: Yes 5lbs  PAIN:  Are you having pain? No  FALLS: Has patient fallen in last 6 months? No  LIVING ENVIRONMENT: Lives with: lives with their spouse Lives in: House/apartment  PLOF: Independent  PATIENT GOALS: To get back to frozen shoulder  NEXT MD VISIT: 05/30/22  OBJECTIVE:   HAND DOMINANCE: Right  ADLs: Overall ADLs: Pt has difficulty with all IADL's and reaching behind his back and overhead for dressing and bathing.   FUNCTIONAL OUTCOME MEASURES: FOTO: 58.53  UPPER EXTREMITY ROM:     Active ROM Left eval Left  Shoulder flexion 135 165  Shoulder abduction 157 160   Shoulder internal rotation 90 90  Shoulder external rotation 45 45  (Blank rows = not tested)  UPPER EXTREMITY MMT:     MMT Left eval Left  Shoulder flexion 4/5 5/5  Shoulder abduction 4+/5 5/5  Shoulder adduction 4/5 5/5  Shoulder extension 4-/5 5/5  Shoulder internal rotation 4+/5 5/5  Shoulder external rotation 4/5 5/5  (Blank rows = not tested)  OBSERVATIONS: moderate fascial restrictions along the biceps, subscapularis, and trapezius   TODAY'S TREATMENT:                                                                                                                              DATE:   05/31/2022  -Manual Therapy: myofascial release and trigger point applied to biceps, trapezius, and subscapularis, in order  to decrease fascial restrictions and decrease pain and improve ROM. - A/ROM: measurements taken, MMT  - Shoulder flexion: lacing chain in seated position 2x  - UBE: level 2, 3' forward 3' reverse, pace: 7.5   05/25/23 -Manual Therapy: myofascial release and trigger point applied to biceps, trapezius, and subscapularis, in order to decrease fascial restrictions and decrease pain and improve ROM. -P/ROM: supine, flexion, abduction, horizontal abduction, er/IR, x10 -A/ROM: supine-flexion, abduction, protraction, horizontal abduction, er/IR, x15 -Proximal shoulder strengthening: supine-paddles, criss cross, circles each direction, x15 each -A/ROM: standing-flexion, abduction, protraction, horizontal abduction, er/IR, x10 -Scapular theraband: red-row, extension, retraction, 10 reps -x to v arms: 10 reps -UBE: level 2, 3' forward 3' reverse, pace: 7.5  05/17/22 -Manual Therapy: myofascial release and trigger point applied to biceps, trapezius, and subscapularis, in order to decrease fascial restrictions and decrease pain and improve ROM. -P/ROM: supine, flexion, abduction, horizontal abduction, er/IR, x10 -AA/ROM: supine-flexion, abduction, protraction, horizontal abduction, er/IR, 2# dowel rod, x15 -Proximal shoulder strengthening: supine-paddles, criss cross, circles each direction, x15 each -AA/ROM: standing-flexion, abduction, protraction, horizontal abduction, er/IR, 2# dowel rod, x15 -Proximal shoulder strengthening on doorway, arm at 90 degrees flexion, 1'    PATIENT EDUCATION: Education details: A/ROM Person educated: Patient Education method: Customer service manager Education comprehension: verbalized understanding and returned demonstration  HOME EXERCISE PROGRAM: 12/21: Wall Slides 1/5: AA/ROM 05/24/22: A/ROM  GOALS: Goals reviewed with patient? Yes  SHORT TERM GOALS: Target date: 05/14/22  Pt will be provided with and educated on HEP to improve mobility in LUE for ADL  completion. Goal status: IN PROGRESS  2.  Pt will decrease LUE fascial restrictions to minimal amounts or less to improve mobility required for overhead reaching tasks.  Goal status: IN PROGRESS  3.  Pt will decrease pain in LUE to 2/10 or less in order to sleep for 4+ consecutive hours without waking due to pain.  Goal status: IN PROGRESS  4.  Pt will increase A/ROM of LUE to [facility] to improve ability to complete overhead and behind the back reaching during dressing and bathing tasks.  Goal status: IN PROGRESS  5.  Pt will increase strength of LUE to 4+/5 to improve ability to perform lifting tasks required during cooking and cleaning activities.  Goal status: IN PROGRESS   OCCUPATIONAL THERAPY DISCHARGE SUMMARY  Visits from Start of Care: 5  Current functional level  related to goals / functional outcomes: Pt reports minimal pain levels and some difficulty with reaching overhead/behind, ROM is [facility]. He has improved ROM and strength with UE   Remaining deficits: Some pain, minimal difficulty with reaching behind    Education / Equipment: Educated pt on HEP and encouraged him to complete at home    Patient agrees to discharge. Patient goals were met. Patient is being discharged due to meeting the stated rehab goals..     ASSESSMENT:  CLINICAL IMPRESSION: Pt has made great progress towards all goals and reports minimal pain levels. Per last MD visit, he is cleared to lift 5 lbs and continue his exercise routine. Pt has made improvements with ROM and UE strength.    PLAN:  OT FREQUENCY: 1x/week  OT DURATION: 6 weeks  PLANNED INTERVENTIONS: self care/ADL training, therapeutic exercise, therapeutic activity, manual therapy, passive range of motion, functional mobility training, ultrasound, moist heat, cryotherapy, patient/family education, and coping strategies training  CONSULTED AND AGREED WITH PLAN OF CARE: Patient  PLAN FOR NEXT SESSION: A/ROM, proximal shoulder  strengthening, add gentle shoulder stretches and overhead lacing, update HEP for scapular theraband, add ball on wall   Lurena Joiner, OTR/L  [phone] 05/31/2022, 2:37 PM

## 2022-06-27 ENCOUNTER — Telehealth: Payer: Self-pay | Admitting: *Deleted

## 2022-06-27 NOTE — Progress Notes (Signed)
  Care Coordination  Outreach Note  06/27/2022 Name: Clifford Harris MRN: [id number] DOB: [birth year]   Care Coordination Outreach Attempts: An unsuccessful telephone outreach was attempted today to offer the patient information about available care coordination services as a benefit of their health plan.   Follow Up Plan:  Additional outreach attempts will be made to offer the patient care coordination information and services.   Encounter Outcome:  No Answer  [place]  Direct Dial: [phone]

## 2022-07-02 NOTE — Progress Notes (Signed)
  Care Coordination   Note   07/02/2022 Name: Clifford Harris MRN: [id number] DOB: [birth year]  Clifford Harris is a 67 y.o. year old male who sees Sharilyn Sites, MD for primary care. I reached out to [name] by phone today to offer care coordination services.  Clifford Harris was given information about Care Coordination services today including:   The Care Coordination services include support from the care team which includes your Nurse Coordinator, Clinical Social Worker, or Pharmacist.  The Care Coordination team is here to help remove barriers to the health concerns and goals most important to you. Care Coordination services are voluntary, and the patient may decline or stop services at any time by request to their care team member.   Care Coordination Consent Status: Patient agreed to services and verbal consent obtained.   Follow up plan:  Telephone appointment with care coordination team member scheduled for:  07/19/22  Encounter Outcome:  Pt. Scheduled  [place]  Direct Dial: [phone]

## 2022-07-03 DIAGNOSIS — J309 Allergic rhinitis, unspecified: Secondary | ICD-10-CM | POA: Diagnosis not present

## 2022-07-03 DIAGNOSIS — F419 Anxiety disorder, unspecified: Secondary | ICD-10-CM | POA: Diagnosis not present

## 2022-07-03 DIAGNOSIS — Z1331 Encounter for screening for depression: Secondary | ICD-10-CM | POA: Diagnosis not present

## 2022-07-03 DIAGNOSIS — Z6826 Body mass index (BMI) 26.0-26.9, adult: Secondary | ICD-10-CM | POA: Diagnosis not present

## 2022-07-03 DIAGNOSIS — G894 Chronic pain syndrome: Secondary | ICD-10-CM | POA: Diagnosis not present

## 2022-07-03 DIAGNOSIS — E119 Type 2 diabetes mellitus without complications: Secondary | ICD-10-CM | POA: Diagnosis not present

## 2022-07-03 DIAGNOSIS — M1991 Primary osteoarthritis, unspecified site: Secondary | ICD-10-CM | POA: Diagnosis not present

## 2022-07-03 DIAGNOSIS — E663 Overweight: Secondary | ICD-10-CM | POA: Diagnosis not present

## 2022-07-03 DIAGNOSIS — Z029 Encounter for administrative examinations, unspecified: Secondary | ICD-10-CM | POA: Diagnosis not present

## 2022-07-03 DIAGNOSIS — Z0001 Encounter for general adult medical examination with abnormal findings: Secondary | ICD-10-CM | POA: Diagnosis not present

## 2022-07-04 ENCOUNTER — Encounter: Payer: Self-pay | Admitting: Radiology

## 2022-07-10 DIAGNOSIS — M1711 Unilateral primary osteoarthritis, right knee: Secondary | ICD-10-CM | POA: Diagnosis not present

## 2022-07-10 DIAGNOSIS — M25572 Pain in left ankle and joints of left foot: Secondary | ICD-10-CM | POA: Diagnosis not present

## 2022-07-19 ENCOUNTER — Encounter: Payer: Self-pay | Admitting: *Deleted

## 2022-07-19 ENCOUNTER — Ambulatory Visit: Payer: Self-pay | Admitting: *Deleted

## 2022-07-19 NOTE — Patient Outreach (Signed)
  Care Coordination   Initial Visit Note   07/19/2022 Name: Clifford Harris MRN: [id number] DOB: Aug 29, 1955  Clifford Harris is a 67 y.o. year old male who sees Clifford Sites, MD for primary care. I spoke with  Clifford Harris by phone today.  What matters to the patients health and wellness today?  Ongoing self management of health   Goals Addressed             This Visit's Progress    Care Coordination Services       Care Coordination Goals: Patient will follow-up with PCP and/or specialist(s) as recommended Patient will take medications as prescribed Patient will reach out to [facility] at [phone] with any care coordination or resource needs  Patient will utilize the [facility] 24 Hour Nurse/Concierge Line as needed [phone]         SDOH assessments and interventions completed:  Yes  SDOH Interventions Today    Flowsheet Row Most Recent Value  SDOH Interventions   Food Insecurity Interventions Intervention Not Indicated  Housing Interventions Intervention Not Indicated  Transportation Interventions Intervention Not Indicated  Utilities Interventions Intervention Not Indicated  Financial Strain Interventions Intervention Not Indicated  Physical Activity Interventions Intervention Not Indicated        Care Coordination Interventions:  Yes, provided  Interventions Today    Flowsheet Row Most Recent Value  Chronic Disease   Chronic disease during today's visit Other  [musculoskeletal pain]  General Interventions   General Interventions Discussed/Reviewed General Interventions Discussed, General Interventions Reviewed, Doctor Visits  Doctor Visits Discussed/Reviewed Doctor Visits Discussed, Doctor Visits Reviewed, Annual Wellness Visits, PCP, Specialist  [patient will f/u with ortho regarding continued upper arm pain s/p shoulder surgery]  PCP/Specialist Visits Compliance with follow-up visit  Exercise Interventions   Exercise Discussed/Reviewed  Exercise Discussed, Exercise Reviewed, Physical Activity  Physical Activity Discussed/Reviewed Types of exercise, Home Exercise Program (HEP), Physical Activity Discussed, Physical Activity Reviewed  Education Interventions   Education Provided Provided Education  Provided Verbal Education On Other  [place] Coordination Services. No resource or care coordination needs at this time.]       Follow up plan: No further intervention required.   Encounter Outcome:  Pt. Visit Completed   Clifford Harris, BSN, RN-BC RN Care Coordinator [place] Direct Dial: [phone] Main #: [phone]

## 2022-08-16 DIAGNOSIS — M1711 Unilateral primary osteoarthritis, right knee: Secondary | ICD-10-CM | POA: Diagnosis not present

## 2022-08-29 ENCOUNTER — Encounter: Payer: Medicare PPO | Admitting: Orthopedic Surgery

## 2022-09-03 DIAGNOSIS — H43822 Vitreomacular adhesion, left eye: Secondary | ICD-10-CM | POA: Diagnosis not present

## 2022-09-03 DIAGNOSIS — H43811 Vitreous degeneration, right eye: Secondary | ICD-10-CM | POA: Diagnosis not present

## 2022-09-05 ENCOUNTER — Encounter: Payer: Self-pay | Admitting: Orthopedic Surgery

## 2022-09-05 ENCOUNTER — Ambulatory Visit (INDEPENDENT_AMBULATORY_CARE_PROVIDER_SITE_OTHER): Payer: Medicare PPO | Admitting: Orthopedic Surgery

## 2022-09-05 DIAGNOSIS — Z9889 Other specified postprocedural states: Secondary | ICD-10-CM

## 2022-09-05 NOTE — Progress Notes (Signed)
Chief Complaint  Patient presents with   Routine Post Op    L shoulder DOS 04/09/22    Surgery December 2023  Left shoulder   PROCEDURE:  Procedure(s): SHOULDER ARTHROSCOPY WITH DISTAL CLAVICLE EXCISION, LIMITED DEBRIDEMENT(labrum) (Left) [phone]    Clifford Harris has returned to normal activity.  He says he really does not feel any pain in his shoulder anymore he thinks he is 95% better  He exhibits full forward elevation so at this point we are going to release him and follow-up as needed

## 2022-09-18 DIAGNOSIS — M1711 Unilateral primary osteoarthritis, right knee: Secondary | ICD-10-CM | POA: Diagnosis not present

## 2022-10-29 DIAGNOSIS — D225 Melanocytic nevi of trunk: Secondary | ICD-10-CM | POA: Diagnosis not present

## 2022-10-29 DIAGNOSIS — T148XXA Other injury of unspecified body region, initial encounter: Secondary | ICD-10-CM | POA: Diagnosis not present

## 2022-10-29 DIAGNOSIS — Z1283 Encounter for screening for malignant neoplasm of skin: Secondary | ICD-10-CM | POA: Diagnosis not present

## 2022-12-23 DIAGNOSIS — Z6826 Body mass index (BMI) 26.0-26.9, adult: Secondary | ICD-10-CM | POA: Diagnosis not present

## 2022-12-23 DIAGNOSIS — S335XXA Sprain of ligaments of lumbar spine, initial encounter: Secondary | ICD-10-CM | POA: Diagnosis not present

## 2022-12-23 DIAGNOSIS — M545 Low back pain, unspecified: Secondary | ICD-10-CM | POA: Diagnosis not present

## 2023-04-16 DIAGNOSIS — M25661 Stiffness of right knee, not elsewhere classified: Secondary | ICD-10-CM | POA: Diagnosis not present

## 2023-04-16 DIAGNOSIS — M1711 Unilateral primary osteoarthritis, right knee: Secondary | ICD-10-CM | POA: Diagnosis not present

## 2023-04-16 DIAGNOSIS — M25561 Pain in right knee: Secondary | ICD-10-CM | POA: Diagnosis not present

## 2023-04-16 DIAGNOSIS — R262 Difficulty in walking, not elsewhere classified: Secondary | ICD-10-CM | POA: Diagnosis not present

## 2023-04-16 DIAGNOSIS — M170 Bilateral primary osteoarthritis of knee: Secondary | ICD-10-CM | POA: Diagnosis not present

## 2023-04-23 DIAGNOSIS — M1711 Unilateral primary osteoarthritis, right knee: Secondary | ICD-10-CM | POA: Diagnosis not present

## 2023-04-23 DIAGNOSIS — R262 Difficulty in walking, not elsewhere classified: Secondary | ICD-10-CM | POA: Diagnosis not present

## 2023-04-23 DIAGNOSIS — M25661 Stiffness of right knee, not elsewhere classified: Secondary | ICD-10-CM | POA: Diagnosis not present

## 2023-04-23 DIAGNOSIS — M25561 Pain in right knee: Secondary | ICD-10-CM | POA: Diagnosis not present

## 2023-04-23 DIAGNOSIS — E119 Type 2 diabetes mellitus without complications: Secondary | ICD-10-CM | POA: Diagnosis not present

## 2023-05-01 DIAGNOSIS — M25661 Stiffness of right knee, not elsewhere classified: Secondary | ICD-10-CM | POA: Diagnosis not present

## 2023-05-01 DIAGNOSIS — M1711 Unilateral primary osteoarthritis, right knee: Secondary | ICD-10-CM | POA: Diagnosis not present

## 2023-05-01 DIAGNOSIS — R262 Difficulty in walking, not elsewhere classified: Secondary | ICD-10-CM | POA: Diagnosis not present

## 2023-05-01 DIAGNOSIS — M25561 Pain in right knee: Secondary | ICD-10-CM | POA: Diagnosis not present

## 2023-05-05 ENCOUNTER — Ambulatory Visit: Payer: Medicare PPO | Admitting: Orthopedic Surgery

## 2023-05-05 ENCOUNTER — Encounter: Payer: Self-pay | Admitting: Orthopedic Surgery

## 2023-05-05 ENCOUNTER — Other Ambulatory Visit (INDEPENDENT_AMBULATORY_CARE_PROVIDER_SITE_OTHER): Payer: Medicare PPO

## 2023-05-05 DIAGNOSIS — M25572 Pain in left ankle and joints of left foot: Secondary | ICD-10-CM

## 2023-05-05 NOTE — Progress Notes (Signed)
Office Visit Note   Patient: Clifford Harris           Date of Birth: 12-21-1955           MRN: [id number] Visit Date: 05/05/2023              Requested by: Assunta Found, MD [address] [place],  [place] [address] PCP: Assunta Found, MD  Chief Complaint  Patient presents with   Left Foot - Pain      HPI: Patient is a 67 year old gentleman who is seen for initial evaluation for left forefoot and midfoot pain.  Patient states he has a history of being a marathon runner for 50 years.  Patient uses spacers between his toes secondary to clawing.  Assessment & Plan: Visit Diagnoses:  1. Pain in left ankle and joints of left foot     Plan: Recommended Achilles stretching, Vive wear sock for the fungal rash, recommended decrease spacer size between the webspaces.  Recommended a 0 drop sneaker.  Follow-Up Instructions: No follow-ups on file.   Ortho Exam  Patient is alert, oriented, no adenopathy, well-dressed, normal affect, normal respiratory effort. Examination of the right foot patient has Achilles contracture with dorsiflexion only to neutral with his knee extended.  He has a palpable pulse patient has fixed clawing of the IP joint of the great toe and a callus beneath the great toe.  Callus was pared without complications.  Examination of the left foot he has ankle dorsiflexion to neutral with his knee extended he has a good dorsalis pedis pulse.  There is a dry fungal rash on both feet with onychomycotic nails.  He has swelling and tenderness to palpation of the base of the first metatarsal consistent with the arthritic changes seen on x-rays.  He has callus beneath the second and third metatarsals consistent with the long second and third metatarsal.  Patient has a corn on the third webspace of the third toe PIP joint.  A 10 blade knife was used to pare the callus.  Patient has prominent swelling and bony spurs at the base of the first metatarsal.  The spurring plantarly would  make it difficult for the patient to obtain relief with a orthotic.  Patient has fixed clawing of his toes.  Imaging: XR Foot Complete Left Result Date: 05/05/2023 Three-view radiographs of the left foot shows osteoarthritis base of the fifth metatarsal and a long second and third metatarsal without evidence of stress fracture.  No images are attached to the encounter.  Labs: No results found for: "HGBA1C", "ESRSEDRATE", "CRP", "LABURIC", "REPTSTATUS", "GRAMSTAIN", "CULT", "LABORGA"   No results found for: "ALBUMIN", "PREALBUMIN", "CBC"  No results found for: "MG" No results found for: "VD25OH"  No results found for: "PREALBUMIN"    Latest Ref Rng & Units 04/05/2022   11:45 AM  CBC EXTENDED  WBC 4.0 - 10.5 K/uL 6.8   RBC 4.22 - 5.81 MIL/uL 4.34   Hemoglobin 13.0 - 17.0 g/dL 19.1   HCT 47.8 - 29.5 % 40.0   Platelets 150 - 400 K/uL 280   NEUT# 1.7 - 7.7 K/uL 4.2   Lymph# 0.7 - 4.0 K/uL 1.8      There is no height or weight on file to calculate BMI.  Orders:  Orders Placed This Encounter  Procedures   XR Foot Complete Left   No orders of the defined types were placed in this encounter.    Procedures: No procedures performed  Clinical Data: No additional  findings.  ROS:  All other systems negative, except as noted in the HPI. Review of Systems  Objective: Vital Signs: There were no vitals taken for this visit.  Specialty Comments:  No specialty comments available.  PMFS History: Patient Active Problem List   Diagnosis Date Noted   S/P shoulder surgery 04/12/2022   Closed fracture of proximal end of left humerus with routine healing 07/27/2020   Special screening for malignant neoplasms, colon 06/10/2018   DDD (degenerative disc disease), cervical 04/06/2015   Tinea pedis of left foot 01/04/2014   Osteoarthritis of foot, left 01/04/2014   Stiffness of joint, lower leg 06/29/2013   Strain of hamstring muscle 06/29/2013   Right hip pain 06/22/2013    Sciatic nerve pain 04/11/2011   Gait abnormality 09/26/2010   Stress fracture of metatarsal bone 04/24/2009   Mononeuritis of lower limb 03/27/2009   Past Medical History:  Diagnosis Date   Arthritis    Depression    Neuropathy     Family History  Problem Relation Age of Onset   Alzheimer's disease Father    Sudden death Neg Hx    Hypertension Neg Hx    Hyperlipidemia Neg Hx    Diabetes Neg Hx    Heart attack Neg Hx    Colon cancer Neg Hx     Past Surgical History:  Procedure Laterality Date   COLONOSCOPY N/A 02/03/2019   Procedure: COLONOSCOPY;  Surgeon: Malissa Hippo, MD;  Location: [facility];  Service: Endoscopy;  Laterality: N/A;  730    SHOULDER ARTHROSCOPY WITH DISTAL CLAVICLE RESECTION Left 04/09/2022   Procedure: SHOULDER ARTHROSCOPY WITH DISTAL CLAVICLE EXCISION, LIMITED DEBRIDEMENT;  Surgeon: Vickki Hearing, MD;  Location: [facility];  Service: Orthopedics;  Laterality: Left;   TONSILLECTOMY AND ADENOIDECTOMY     WISDOM TOOTH EXTRACTION     Social History   Occupational History   Not on file  Tobacco Use   Smoking status: Never   Smokeless tobacco: Never  Vaping Use   Vaping status: Never Used  Substance and Sexual Activity   Alcohol use: Yes    Alcohol/week: 14.0 standard drinks of alcohol    Types: 14 Glasses of wine per week   Drug use: No   Sexual activity: Not on file

## 2023-05-08 DIAGNOSIS — R262 Difficulty in walking, not elsewhere classified: Secondary | ICD-10-CM | POA: Diagnosis not present

## 2023-05-08 DIAGNOSIS — M1711 Unilateral primary osteoarthritis, right knee: Secondary | ICD-10-CM | POA: Diagnosis not present

## 2023-05-08 DIAGNOSIS — M25661 Stiffness of right knee, not elsewhere classified: Secondary | ICD-10-CM | POA: Diagnosis not present

## 2023-05-08 DIAGNOSIS — M25561 Pain in right knee: Secondary | ICD-10-CM | POA: Diagnosis not present

## 2023-05-15 DIAGNOSIS — M25561 Pain in right knee: Secondary | ICD-10-CM | POA: Diagnosis not present

## 2023-05-15 DIAGNOSIS — M25661 Stiffness of right knee, not elsewhere classified: Secondary | ICD-10-CM | POA: Diagnosis not present

## 2023-05-15 DIAGNOSIS — M1711 Unilateral primary osteoarthritis, right knee: Secondary | ICD-10-CM | POA: Diagnosis not present

## 2023-05-15 DIAGNOSIS — R262 Difficulty in walking, not elsewhere classified: Secondary | ICD-10-CM | POA: Diagnosis not present

## 2023-05-27 DIAGNOSIS — H43391 Other vitreous opacities, right eye: Secondary | ICD-10-CM | POA: Diagnosis not present

## 2023-07-07 DIAGNOSIS — F419 Anxiety disorder, unspecified: Secondary | ICD-10-CM | POA: Diagnosis not present

## 2023-07-07 DIAGNOSIS — R7309 Other abnormal glucose: Secondary | ICD-10-CM | POA: Diagnosis not present

## 2023-07-07 DIAGNOSIS — Z6827 Body mass index (BMI) 27.0-27.9, adult: Secondary | ICD-10-CM | POA: Diagnosis not present

## 2023-07-07 DIAGNOSIS — G894 Chronic pain syndrome: Secondary | ICD-10-CM | POA: Diagnosis not present

## 2023-07-07 DIAGNOSIS — F320 Major depressive disorder, single episode, mild: Secondary | ICD-10-CM | POA: Diagnosis not present

## 2023-07-07 DIAGNOSIS — M1991 Primary osteoarthritis, unspecified site: Secondary | ICD-10-CM | POA: Diagnosis not present

## 2023-07-07 DIAGNOSIS — Z0001 Encounter for general adult medical examination with abnormal findings: Secondary | ICD-10-CM | POA: Diagnosis not present

## 2023-07-07 DIAGNOSIS — Z125 Encounter for screening for malignant neoplasm of prostate: Secondary | ICD-10-CM | POA: Diagnosis not present

## 2023-07-07 DIAGNOSIS — Z1331 Encounter for screening for depression: Secondary | ICD-10-CM | POA: Diagnosis not present

## 2023-07-07 DIAGNOSIS — E663 Overweight: Secondary | ICD-10-CM | POA: Diagnosis not present

## 2023-07-11 DIAGNOSIS — M1711 Unilateral primary osteoarthritis, right knee: Secondary | ICD-10-CM | POA: Diagnosis not present

## 2023-07-11 DIAGNOSIS — R262 Difficulty in walking, not elsewhere classified: Secondary | ICD-10-CM | POA: Diagnosis not present

## 2023-07-11 DIAGNOSIS — M25561 Pain in right knee: Secondary | ICD-10-CM | POA: Diagnosis not present

## 2023-07-11 DIAGNOSIS — M25661 Stiffness of right knee, not elsewhere classified: Secondary | ICD-10-CM | POA: Diagnosis not present

## 2023-09-04 DIAGNOSIS — E663 Overweight: Secondary | ICD-10-CM | POA: Diagnosis not present

## 2023-09-04 DIAGNOSIS — J029 Acute pharyngitis, unspecified: Secondary | ICD-10-CM | POA: Diagnosis not present

## 2023-09-04 DIAGNOSIS — J309 Allergic rhinitis, unspecified: Secondary | ICD-10-CM | POA: Diagnosis not present

## 2023-09-04 DIAGNOSIS — Z6826 Body mass index (BMI) 26.0-26.9, adult: Secondary | ICD-10-CM | POA: Diagnosis not present

## 2023-09-04 DIAGNOSIS — Z20828 Contact with and (suspected) exposure to other viral communicable diseases: Secondary | ICD-10-CM | POA: Diagnosis not present

## 2023-09-04 DIAGNOSIS — R6889 Other general symptoms and signs: Secondary | ICD-10-CM | POA: Diagnosis not present

## 2023-10-13 DIAGNOSIS — Z125 Encounter for screening for malignant neoplasm of prostate: Secondary | ICD-10-CM | POA: Diagnosis not present

## 2023-10-20 ENCOUNTER — Other Ambulatory Visit: Payer: Self-pay | Admitting: Orthopedic Surgery

## 2023-10-29 DIAGNOSIS — B355 Tinea imbricata: Secondary | ICD-10-CM | POA: Diagnosis not present

## 2023-10-29 DIAGNOSIS — Z1283 Encounter for screening for malignant neoplasm of skin: Secondary | ICD-10-CM | POA: Diagnosis not present

## 2023-10-29 DIAGNOSIS — X32XXXD Exposure to sunlight, subsequent encounter: Secondary | ICD-10-CM | POA: Diagnosis not present

## 2023-10-29 DIAGNOSIS — L570 Actinic keratosis: Secondary | ICD-10-CM | POA: Diagnosis not present

## 2023-10-29 DIAGNOSIS — D225 Melanocytic nevi of trunk: Secondary | ICD-10-CM | POA: Diagnosis not present

## 2023-11-14 DIAGNOSIS — M1711 Unilateral primary osteoarthritis, right knee: Secondary | ICD-10-CM | POA: Diagnosis not present

## 2023-11-14 DIAGNOSIS — M25661 Stiffness of right knee, not elsewhere classified: Secondary | ICD-10-CM | POA: Diagnosis not present

## 2023-11-14 DIAGNOSIS — M25561 Pain in right knee: Secondary | ICD-10-CM | POA: Diagnosis not present

## 2023-11-14 DIAGNOSIS — R262 Difficulty in walking, not elsewhere classified: Secondary | ICD-10-CM | POA: Diagnosis not present

## 2023-11-21 DIAGNOSIS — M25661 Stiffness of right knee, not elsewhere classified: Secondary | ICD-10-CM | POA: Diagnosis not present

## 2023-11-21 DIAGNOSIS — R262 Difficulty in walking, not elsewhere classified: Secondary | ICD-10-CM | POA: Diagnosis not present

## 2023-11-21 DIAGNOSIS — M1711 Unilateral primary osteoarthritis, right knee: Secondary | ICD-10-CM | POA: Diagnosis not present

## 2023-11-21 DIAGNOSIS — M25561 Pain in right knee: Secondary | ICD-10-CM | POA: Diagnosis not present

## 2023-11-28 DIAGNOSIS — M25561 Pain in right knee: Secondary | ICD-10-CM | POA: Diagnosis not present

## 2023-11-28 DIAGNOSIS — R262 Difficulty in walking, not elsewhere classified: Secondary | ICD-10-CM | POA: Diagnosis not present

## 2023-11-28 DIAGNOSIS — M25661 Stiffness of right knee, not elsewhere classified: Secondary | ICD-10-CM | POA: Diagnosis not present

## 2023-11-28 DIAGNOSIS — M1711 Unilateral primary osteoarthritis, right knee: Secondary | ICD-10-CM | POA: Diagnosis not present

## 2023-12-05 DIAGNOSIS — R262 Difficulty in walking, not elsewhere classified: Secondary | ICD-10-CM | POA: Diagnosis not present

## 2023-12-05 DIAGNOSIS — M25561 Pain in right knee: Secondary | ICD-10-CM | POA: Diagnosis not present

## 2023-12-05 DIAGNOSIS — M1711 Unilateral primary osteoarthritis, right knee: Secondary | ICD-10-CM | POA: Diagnosis not present

## 2023-12-05 DIAGNOSIS — M25661 Stiffness of right knee, not elsewhere classified: Secondary | ICD-10-CM | POA: Diagnosis not present

## 2023-12-12 DIAGNOSIS — R262 Difficulty in walking, not elsewhere classified: Secondary | ICD-10-CM | POA: Diagnosis not present

## 2023-12-12 DIAGNOSIS — M25561 Pain in right knee: Secondary | ICD-10-CM | POA: Diagnosis not present

## 2023-12-12 DIAGNOSIS — M1711 Unilateral primary osteoarthritis, right knee: Secondary | ICD-10-CM | POA: Diagnosis not present

## 2023-12-12 DIAGNOSIS — M25661 Stiffness of right knee, not elsewhere classified: Secondary | ICD-10-CM | POA: Diagnosis not present

## 2024-01-13 DIAGNOSIS — M25561 Pain in right knee: Secondary | ICD-10-CM | POA: Diagnosis not present

## 2024-01-13 DIAGNOSIS — M25661 Stiffness of right knee, not elsewhere classified: Secondary | ICD-10-CM | POA: Diagnosis not present

## 2024-01-13 DIAGNOSIS — R262 Difficulty in walking, not elsewhere classified: Secondary | ICD-10-CM | POA: Diagnosis not present

## 2024-01-13 DIAGNOSIS — M1711 Unilateral primary osteoarthritis, right knee: Secondary | ICD-10-CM | POA: Diagnosis not present

## 2024-03-08 ENCOUNTER — Encounter: Payer: Self-pay | Admitting: Radiology

## 2024-04-15 ENCOUNTER — Other Ambulatory Visit: Payer: Self-pay | Admitting: Orthopedic Surgery
